# Patient Record
Sex: Female | Born: 1970 | Race: White | Hispanic: No | Smoking: Never smoker
Health system: Southern US, Community
[De-identification: ages and names within clinical notes are randomized; demographics above are authoritative.]

## PROBLEM LIST (undated history)

## (undated) DIAGNOSIS — F419 Anxiety disorder, unspecified: Secondary | ICD-10-CM

## (undated) DIAGNOSIS — F329 Major depressive disorder, single episode, unspecified: Secondary | ICD-10-CM

## (undated) DIAGNOSIS — K219 Gastro-esophageal reflux disease without esophagitis: Secondary | ICD-10-CM

## (undated) DIAGNOSIS — E119 Type 2 diabetes mellitus without complications: Secondary | ICD-10-CM

## (undated) DIAGNOSIS — G629 Polyneuropathy, unspecified: Secondary | ICD-10-CM

## (undated) DIAGNOSIS — R51 Headache: Secondary | ICD-10-CM

## (undated) DIAGNOSIS — F32A Depression, unspecified: Secondary | ICD-10-CM

## (undated) DIAGNOSIS — R519 Headache, unspecified: Secondary | ICD-10-CM

## (undated) HISTORY — DX: Headache, unspecified: R51.9

## (undated) HISTORY — DX: Major depressive disorder, single episode, unspecified: F32.9

## (undated) HISTORY — DX: Type 2 diabetes mellitus without complications: E11.9

## (undated) HISTORY — PX: APPENDECTOMY: SHX54

## (undated) HISTORY — DX: Headache: R51

## (undated) HISTORY — PX: CHOLECYSTECTOMY: SHX55

## (undated) HISTORY — DX: Depression, unspecified: F32.A

## (undated) HISTORY — DX: Anxiety disorder, unspecified: F41.9

## (undated) HISTORY — DX: Polyneuropathy, unspecified: G62.9

## (undated) HISTORY — DX: Gastro-esophageal reflux disease without esophagitis: K21.9

## (undated) HISTORY — PX: TONSILLECTOMY AND ADENOIDECTOMY: SUR1326

---

## 1998-04-26 ENCOUNTER — Encounter: Admission: RE | Admit: 1998-04-26 | Discharge: 1998-07-25 | Payer: Self-pay | Admitting: Gynecology

## 1998-06-25 ENCOUNTER — Other Ambulatory Visit: Admission: RE | Admit: 1998-06-25 | Discharge: 1998-06-25 | Payer: Self-pay | Admitting: Gynecology

## 1998-07-01 ENCOUNTER — Inpatient Hospital Stay (HOSPITAL_COMMUNITY): Admission: AD | Admit: 1998-07-01 | Discharge: 1998-07-01 | Payer: Self-pay | Admitting: Gynecology

## 1998-07-10 ENCOUNTER — Inpatient Hospital Stay (HOSPITAL_COMMUNITY): Admission: AD | Admit: 1998-07-10 | Discharge: 1998-07-12 | Payer: Self-pay | Admitting: Gynecology

## 2016-01-14 DIAGNOSIS — F4001 Agoraphobia with panic disorder: Secondary | ICD-10-CM | POA: Diagnosis not present

## 2016-01-14 DIAGNOSIS — F332 Major depressive disorder, recurrent severe without psychotic features: Secondary | ICD-10-CM | POA: Diagnosis not present

## 2016-01-14 DIAGNOSIS — F411 Generalized anxiety disorder: Secondary | ICD-10-CM | POA: Diagnosis not present

## 2016-05-08 DIAGNOSIS — E1165 Type 2 diabetes mellitus with hyperglycemia: Secondary | ICD-10-CM | POA: Diagnosis not present

## 2016-05-08 DIAGNOSIS — F419 Anxiety disorder, unspecified: Secondary | ICD-10-CM | POA: Diagnosis not present

## 2016-05-08 DIAGNOSIS — R110 Nausea: Secondary | ICD-10-CM | POA: Diagnosis not present

## 2016-05-08 DIAGNOSIS — R05 Cough: Secondary | ICD-10-CM | POA: Diagnosis not present

## 2016-05-08 DIAGNOSIS — Z7984 Long term (current) use of oral hypoglycemic drugs: Secondary | ICD-10-CM | POA: Diagnosis not present

## 2016-05-08 DIAGNOSIS — R51 Headache: Secondary | ICD-10-CM | POA: Diagnosis not present

## 2016-05-08 DIAGNOSIS — Z794 Long term (current) use of insulin: Secondary | ICD-10-CM | POA: Diagnosis not present

## 2016-05-08 DIAGNOSIS — H538 Other visual disturbances: Secondary | ICD-10-CM | POA: Diagnosis not present

## 2016-07-16 DIAGNOSIS — K210 Gastro-esophageal reflux disease with esophagitis: Secondary | ICD-10-CM | POA: Diagnosis not present

## 2016-07-16 DIAGNOSIS — E119 Type 2 diabetes mellitus without complications: Secondary | ICD-10-CM | POA: Diagnosis not present

## 2016-07-16 DIAGNOSIS — F331 Major depressive disorder, recurrent, moderate: Secondary | ICD-10-CM | POA: Diagnosis not present

## 2016-07-16 DIAGNOSIS — Z79899 Other long term (current) drug therapy: Secondary | ICD-10-CM | POA: Diagnosis not present

## 2016-07-16 DIAGNOSIS — E782 Mixed hyperlipidemia: Secondary | ICD-10-CM | POA: Diagnosis not present

## 2016-07-16 DIAGNOSIS — E559 Vitamin D deficiency, unspecified: Secondary | ICD-10-CM | POA: Diagnosis not present

## 2016-08-04 DIAGNOSIS — E782 Mixed hyperlipidemia: Secondary | ICD-10-CM | POA: Diagnosis not present

## 2016-08-04 DIAGNOSIS — E119 Type 2 diabetes mellitus without complications: Secondary | ICD-10-CM | POA: Diagnosis not present

## 2016-08-04 DIAGNOSIS — K219 Gastro-esophageal reflux disease without esophagitis: Secondary | ICD-10-CM | POA: Diagnosis not present

## 2016-08-04 DIAGNOSIS — F329 Major depressive disorder, single episode, unspecified: Secondary | ICD-10-CM | POA: Diagnosis not present

## 2016-10-14 DIAGNOSIS — R358 Other polyuria: Secondary | ICD-10-CM | POA: Diagnosis not present

## 2016-10-14 DIAGNOSIS — K219 Gastro-esophageal reflux disease without esophagitis: Secondary | ICD-10-CM | POA: Diagnosis not present

## 2016-10-14 DIAGNOSIS — E119 Type 2 diabetes mellitus without complications: Secondary | ICD-10-CM | POA: Diagnosis not present

## 2016-10-14 DIAGNOSIS — G4700 Insomnia, unspecified: Secondary | ICD-10-CM | POA: Diagnosis not present

## 2016-10-14 DIAGNOSIS — F411 Generalized anxiety disorder: Secondary | ICD-10-CM | POA: Diagnosis not present

## 2016-12-23 DIAGNOSIS — Z23 Encounter for immunization: Secondary | ICD-10-CM | POA: Diagnosis not present

## 2016-12-23 DIAGNOSIS — F329 Major depressive disorder, single episode, unspecified: Secondary | ICD-10-CM | POA: Diagnosis not present

## 2016-12-23 DIAGNOSIS — K219 Gastro-esophageal reflux disease without esophagitis: Secondary | ICD-10-CM | POA: Diagnosis not present

## 2016-12-23 DIAGNOSIS — F419 Anxiety disorder, unspecified: Secondary | ICD-10-CM | POA: Diagnosis not present

## 2016-12-23 DIAGNOSIS — Z79899 Other long term (current) drug therapy: Secondary | ICD-10-CM | POA: Diagnosis not present

## 2016-12-23 DIAGNOSIS — Z882 Allergy status to sulfonamides status: Secondary | ICD-10-CM | POA: Diagnosis not present

## 2016-12-23 DIAGNOSIS — E1165 Type 2 diabetes mellitus with hyperglycemia: Secondary | ICD-10-CM | POA: Diagnosis not present

## 2016-12-23 DIAGNOSIS — Z794 Long term (current) use of insulin: Secondary | ICD-10-CM | POA: Diagnosis not present

## 2016-12-23 DIAGNOSIS — S61412A Laceration without foreign body of left hand, initial encounter: Secondary | ICD-10-CM | POA: Diagnosis not present

## 2017-02-17 DIAGNOSIS — E119 Type 2 diabetes mellitus without complications: Secondary | ICD-10-CM | POA: Diagnosis not present

## 2017-02-17 DIAGNOSIS — M940 Chondrocostal junction syndrome [Tietze]: Secondary | ICD-10-CM | POA: Diagnosis not present

## 2017-02-17 DIAGNOSIS — Z794 Long term (current) use of insulin: Secondary | ICD-10-CM | POA: Diagnosis not present

## 2017-02-17 DIAGNOSIS — Z9049 Acquired absence of other specified parts of digestive tract: Secondary | ICD-10-CM | POA: Diagnosis not present

## 2017-02-17 DIAGNOSIS — J069 Acute upper respiratory infection, unspecified: Secondary | ICD-10-CM | POA: Diagnosis not present

## 2017-02-17 DIAGNOSIS — M549 Dorsalgia, unspecified: Secondary | ICD-10-CM | POA: Diagnosis not present

## 2017-02-17 DIAGNOSIS — R935 Abnormal findings on diagnostic imaging of other abdominal regions, including retroperitoneum: Secondary | ICD-10-CM | POA: Diagnosis not present

## 2017-04-12 ENCOUNTER — Ambulatory Visit: Payer: Self-pay | Admitting: Family Medicine

## 2017-04-16 ENCOUNTER — Telehealth: Payer: Self-pay | Admitting: *Deleted

## 2017-04-16 NOTE — Telephone Encounter (Signed)
PreVisit Call attempted. Voicemail box not set up.

## 2017-04-19 ENCOUNTER — Encounter: Payer: Self-pay | Admitting: Family Medicine

## 2017-04-19 ENCOUNTER — Ambulatory Visit (INDEPENDENT_AMBULATORY_CARE_PROVIDER_SITE_OTHER): Payer: Medicare Other | Admitting: Family Medicine

## 2017-04-19 VITALS — BP 142/86 | HR 93 | Temp 98.3°F | Ht 64.0 in | Wt 239.8 lb

## 2017-04-19 DIAGNOSIS — E118 Type 2 diabetes mellitus with unspecified complications: Secondary | ICD-10-CM

## 2017-04-19 DIAGNOSIS — R5383 Other fatigue: Secondary | ICD-10-CM

## 2017-04-19 DIAGNOSIS — Z0000 Encounter for general adult medical examination without abnormal findings: Secondary | ICD-10-CM

## 2017-04-19 DIAGNOSIS — Z6841 Body Mass Index (BMI) 40.0 and over, adult: Secondary | ICD-10-CM | POA: Diagnosis not present

## 2017-04-19 DIAGNOSIS — Z794 Long term (current) use of insulin: Secondary | ICD-10-CM | POA: Diagnosis not present

## 2017-04-19 DIAGNOSIS — M25551 Pain in right hip: Secondary | ICD-10-CM | POA: Diagnosis not present

## 2017-04-19 DIAGNOSIS — F5104 Psychophysiologic insomnia: Secondary | ICD-10-CM

## 2017-04-19 DIAGNOSIS — N951 Menopausal and female climacteric states: Secondary | ICD-10-CM

## 2017-04-19 DIAGNOSIS — B353 Tinea pedis: Secondary | ICD-10-CM | POA: Diagnosis not present

## 2017-04-19 DIAGNOSIS — F411 Generalized anxiety disorder: Secondary | ICD-10-CM | POA: Diagnosis not present

## 2017-04-19 DIAGNOSIS — R21 Rash and other nonspecific skin eruption: Secondary | ICD-10-CM | POA: Diagnosis not present

## 2017-04-19 LAB — COMPREHENSIVE METABOLIC PANEL WITH GFR
ALT: 51 U/L — ABNORMAL HIGH (ref 0–35)
AST: 36 U/L (ref 0–37)
Albumin: 4.6 g/dL (ref 3.5–5.2)
Alkaline Phosphatase: 112 U/L (ref 39–117)
BUN: 8 mg/dL (ref 6–23)
CO2: 30 meq/L (ref 19–32)
Calcium: 10 mg/dL (ref 8.4–10.5)
Chloride: 96 meq/L (ref 96–112)
Creatinine, Ser: 0.53 mg/dL (ref 0.40–1.20)
GFR: 131.8 mL/min
Glucose, Bld: 310 mg/dL — ABNORMAL HIGH (ref 70–99)
Potassium: 4.2 meq/L (ref 3.5–5.1)
Sodium: 134 meq/L — ABNORMAL LOW (ref 135–145)
Total Bilirubin: 0.5 mg/dL (ref 0.2–1.2)
Total Protein: 8 g/dL (ref 6.0–8.3)

## 2017-04-19 LAB — POCT GLYCOSYLATED HEMOGLOBIN (HGB A1C): Hemoglobin A1C: 10.7

## 2017-04-19 LAB — MICROALBUMIN / CREATININE URINE RATIO
Creatinine,U: 51.5 mg/dL
MICROALB UR: 1.6 mg/dL (ref 0.0–1.9)
Microalb Creat Ratio: 3.1 mg/g (ref 0.0–30.0)

## 2017-04-19 LAB — LIPID PANEL
CHOLESTEROL: 235 mg/dL — AB (ref 0–200)
HDL: 59.5 mg/dL (ref >39.00)
LDL Cholesterol: 149 mg/dL — ABNORMAL HIGH (ref 0–99)
NonHDL: 175.31
Total CHOL/HDL Ratio: 4
Triglycerides: 131 mg/dL (ref 0.0–149.0)
VLDL: 26.2 mg/dL (ref 0.0–40.0)

## 2017-04-19 LAB — CBC
HEMATOCRIT: 41.3 % (ref 36.0–46.0)
HEMOGLOBIN: 13.5 g/dL (ref 12.0–15.0)
MCHC: 32.6 g/dL (ref 30.0–36.0)
MCV: 85.9 fl (ref 78.0–100.0)
Platelets: 216 10*3/uL (ref 150.0–400.0)
RBC: 4.8 Mil/uL (ref 3.87–5.11)
RDW: 15.3 % (ref 11.5–15.5)
WBC: 8 10*3/uL (ref 4.0–10.5)

## 2017-04-19 LAB — TSH: TSH: 1.17 u[IU]/mL (ref 0.35–4.50)

## 2017-04-19 MED ORDER — GABAPENTIN 300 MG PO CAPS: ORAL_CAPSULE | ORAL | 2 refills | Status: DC

## 2017-04-19 MED ORDER — INSULIN GLARGINE 100 UNITS/ML SOLOSTAR PEN: 20.0000 [IU] | PEN_INJECTOR | Freq: Every day | SUBCUTANEOUS | 11 refills | Status: DC

## 2017-04-19 MED ORDER — CLOTRIMAZOLE 1 % EX CREA: 1.0000 "application " | TOPICAL_CREAM | Freq: Two times a day (BID) | CUTANEOUS | 1 refills | Status: DC

## 2017-04-19 MED ORDER — METFORMIN HCL 1000 MG PO TABS: 1000.0000 mg | ORAL_TABLET | Freq: Two times a day (BID) | ORAL | 0 refills | Status: DC

## 2017-04-19 MED ORDER — PEN NEEDLES 31G X 5 MM MISC: 1.0000 | Freq: Every day | 4 refills | Status: DC

## 2017-04-19 MED ORDER — BLOOD GLUCOSE MONITOR KIT: PACK | 0 refills | Status: DC

## 2017-04-19 NOTE — Patient Instructions (Addendum)
Vitamin E 400 IU Cool rinse at end of shower Please schedule an eye exam- for diabetic eye exam Restart metformin at 1/2 pill twice a day for a couple of days to avoid stomach upset Please call today and [phone] Please call today and schedule an appointment for a therapist- [phone] Schedule follow up/ complete physical in 1 month  Perimenopause Perimenopause is the time when your body begins to move into the menopause (no menstrual period for 12 straight months). It is a natural process. Perimenopause can begin 2-8 years before the menopause and usually lasts for 1 year after the menopause. During this time, your ovaries may or may not produce an egg. The ovaries vary in their production of estrogen and progesterone hormones each month. This can cause irregular menstrual periods, difficulty getting pregnant, vaginal bleeding between periods, and uncomfortable symptoms. What are the causes?  Irregular production of the ovarian hormones, estrogen and progesterone, and not ovulating every month. Other causes include:  Tumor of the pituitary gland in the brain.  Medical disease that affects the ovaries.  Radiation treatment.  Chemotherapy.  Unknown causes.  Heavy smoking and excessive alcohol intake can bring on perimenopause sooner. What are the signs or symptoms?  Hot flashes.  Night sweats.  Irregular menstrual periods.  Decreased sex drive.  Vaginal dryness.  Headaches.  Mood swings.  Depression.  Memory problems.  Irritability.  Tiredness.  Weight gain.  Trouble getting pregnant.  The beginning of losing bone cells (osteoporosis).  The beginning of hardening of the arteries (atherosclerosis). How is this diagnosed? Your health care provider will make a diagnosis by analyzing your age, menstrual history, and symptoms. He or she will do a physical exam and note any changes in your  body, especially your female organs. Female hormone tests may or may not be helpful depending on the amount of female hormones you produce and when you produce them. However, other hormone tests may be helpful to rule out other problems. How is this treated? In some cases, no treatment is needed. The decision on whether treatment is necessary during the perimenopause should be made by you and your health care provider based on how the symptoms are affecting you and your lifestyle. Various treatments are available, such as:  Treating individual symptoms with a specific medicine for that symptom.  Herbal medicines that can help specific symptoms.  Counseling.  Group therapy. Follow these instructions at home:  Keep track of your menstrual periods (when they occur, how heavy they are, how long between periods, and how long they last) as well as your symptoms and when they started.  Only take over-the-counter or prescription medicines as directed by your health care provider.  Sleep and rest.  Exercise.  Eat a diet that contains calcium (good for your bones) and soy (acts like the estrogen hormone).  Do not smoke.  Avoid alcoholic beverages.  Take vitamin supplements as recommended by your health care provider. Taking vitamin E may help in certain cases.  Take calcium and vitamin D supplements to help prevent bone loss.  Group therapy is sometimes helpful.  Acupuncture may help in some cases. Contact a health care provider if:  You have questions about any symptoms you are having.  You need a referral to a specialist (gynecologist, psychiatrist, or psychologist). Get help right away if:  You have vaginal bleeding.  Your period lasts longer than 8 days.  Your periods are recurring sooner than  21 days.  You have bleeding after intercourse.  You have severe depression.  You have pain when you urinate.  You have severe headaches.  You have vision problems. This  information is not intended to replace advice given to you by your health care provider. Make sure you discuss any questions you have with your health care provider. Document Released: 01/21/2005 Document Revised: 05/21/2016 Document Reviewed: 07/13/2013 Elsevier Interactive Patient Education  2017 [employer].
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
## 2017-04-19 NOTE — Progress Notes (Signed)
Pre visit review using our clinic review tool, if applicable. No additional management support is needed unless otherwise documented below in the visit note. 

## 2017-04-20 ENCOUNTER — Encounter: Payer: Self-pay | Admitting: Family Medicine

## 2017-04-21 ENCOUNTER — Other Ambulatory Visit: Payer: Self-pay | Admitting: Family Medicine

## 2017-04-21 ENCOUNTER — Telehealth: Payer: Self-pay | Admitting: Family Medicine

## 2017-04-21 MED ORDER — INSULIN DETEMIR 100 UNIT/ML FLEXPEN: 20.0000 [IU] | PEN_INJECTOR | Freq: Every day | SUBCUTANEOUS | 11 refills | Status: DC

## 2017-04-21 MED ORDER — LISINOPRIL 5 MG PO TABS: 5.0000 mg | ORAL_TABLET | Freq: Every day | ORAL | 3 refills | Status: DC

## 2017-04-21 MED ORDER — ATORVASTATIN CALCIUM 10 MG PO TABS: 10.0000 mg | ORAL_TABLET | Freq: Every day | ORAL | 3 refills | Status: DC

## 2017-04-21 NOTE — Addendum Note (Signed)
Addended by: Olean Ree B on: 04/21/2017 09:25 PM   Modules accepted: Orders

## 2017-04-21 NOTE — Telephone Encounter (Signed)
error 

## 2017-04-21 NOTE — Telephone Encounter (Signed)
Patient called in reference to Insulin Lantus and test strips (as stated below) Please call and advise at [phone]. OK to leave message.

## 2017-04-21 NOTE — Telephone Encounter (Signed)
Johnathan from [facility] called in reference to patient's blood sugar test strips. Wanting to know if we received the fax. Please call and advise [phone]

## 2017-04-28 NOTE — Telephone Encounter (Signed)
Called and spoke with Pharmacy staff requesting that the paper be re-faxed considering that we haven't received it.   Called again today and spoke with Rudie Meyer from [employer]. Which he tranferred me to [employer] and she informed me that she will reach out and try to get this issue addressed today. I informed her that the patient needs to check her blood sugars and this is a concern we have.   Called and spoke with patient informing her that I have reached out to pharmacy again an will contact her as soon as I receive the CMN form. Patient verbalized understanding.

## 2017-04-28 NOTE — Telephone Encounter (Signed)
Received paper work, Completed and faxed back to pharmacy. Called and informed patient, nothing further needed at this time.

## 2017-05-03 ENCOUNTER — Other Ambulatory Visit: Payer: Self-pay | Admitting: Family Medicine

## 2017-05-03 ENCOUNTER — Telehealth: Payer: Self-pay | Admitting: Emergency Medicine

## 2017-05-03 MED ORDER — IBUPROFEN 800 MG PO TABS: 800.0000 mg | ORAL_TABLET | Freq: Two times a day (BID) | ORAL | 0 refills | Status: DC | PRN

## 2017-05-03 NOTE — Telephone Encounter (Signed)
Prescription for ibuprofen sent to patient's pharmacy.

## 2017-05-03 NOTE — Telephone Encounter (Signed)
Pt called stating she has not yet received her test strips. I informed her that I would contact [facility] and check the status. Called [facility] and spoke with KP and she informed me that she has received our fax and is awaiting medicare to approve. She states that it should be sometime today and the patient would received a message on her cell phone.   Patient also complain's of a headache and request ibprofen 800mg  be called in.

## 2017-05-03 NOTE — Progress Notes (Signed)
Patient reports chronic headache and has previously gotten good relief with ibuprofen 800 mg. Will send in prescription. Patient instructed to make appointment if not improved in 3-4 days.

## 2017-05-04 ENCOUNTER — Telehealth: Payer: Self-pay | Admitting: Family Medicine

## 2017-05-04 NOTE — Telephone Encounter (Signed)
[facility] needed [facility] diabetic diagnosis and diabetic type for patient. Please call pharmacy and advise.

## 2017-05-04 NOTE — Telephone Encounter (Signed)
Call[facility] and [facility] and provided her with the diabetic code. Nothing further needed at this time.

## 2017-05-17 ENCOUNTER — Other Ambulatory Visit (HOSPITAL_COMMUNITY)
Admission: RE | Admit: 2017-05-17 | Discharge: 2017-05-17 | Disposition: A | Discharge status: Home / Self Care | Payer: Medicare Other | Source: Ambulatory Visit | Attending: Family Medicine | Admitting: Family Medicine

## 2017-05-17 ENCOUNTER — Encounter: Payer: Self-pay | Admitting: Family Medicine

## 2017-05-17 ENCOUNTER — Ambulatory Visit (INDEPENDENT_AMBULATORY_CARE_PROVIDER_SITE_OTHER): Payer: Medicare Other | Admitting: Family Medicine

## 2017-05-17 VITALS — BP 130/80 | HR 76 | Ht 64.0 in | Wt 235.0 lb

## 2017-05-17 DIAGNOSIS — K219 Gastro-esophageal reflux disease without esophagitis: Secondary | ICD-10-CM | POA: Diagnosis not present

## 2017-05-17 DIAGNOSIS — Z01419 Encounter for gynecological examination (general) (routine) without abnormal findings: Secondary | ICD-10-CM | POA: Diagnosis not present

## 2017-05-17 DIAGNOSIS — E118 Type 2 diabetes mellitus with unspecified complications: Secondary | ICD-10-CM | POA: Diagnosis not present

## 2017-05-17 DIAGNOSIS — Z124 Encounter for screening for malignant neoplasm of cervix: Secondary | ICD-10-CM | POA: Insufficient documentation

## 2017-05-17 DIAGNOSIS — Z794 Long term (current) use of insulin: Secondary | ICD-10-CM

## 2017-05-17 DIAGNOSIS — R112 Nausea with vomiting, unspecified: Secondary | ICD-10-CM | POA: Diagnosis not present

## 2017-05-17 DIAGNOSIS — G44219 Episodic tension-type headache, not intractable: Secondary | ICD-10-CM | POA: Diagnosis not present

## 2017-05-17 DIAGNOSIS — M25551 Pain in right hip: Secondary | ICD-10-CM | POA: Diagnosis not present

## 2017-05-17 DIAGNOSIS — M79671 Pain in right foot: Secondary | ICD-10-CM | POA: Diagnosis not present

## 2017-05-17 DIAGNOSIS — Z0000 Encounter for general adult medical examination without abnormal findings: Secondary | ICD-10-CM | POA: Diagnosis not present

## 2017-05-17 LAB — COMPREHENSIVE METABOLIC PANEL
ALT: 47 U/L — ABNORMAL HIGH (ref 0–35)
AST: 29 U/L (ref 0–37)
Albumin: 4.8 g/dL (ref 3.5–5.2)
Alkaline Phosphatase: 93 U/L (ref 39–117)
BUN: 13 mg/dL (ref 6–23)
CHLORIDE: 94 meq/L — AB (ref 96–112)
CO2: 30 meq/L (ref 19–32)
Calcium: 10.7 mg/dL — ABNORMAL HIGH (ref 8.4–10.5)
Creatinine, Ser: 0.61 mg/dL (ref 0.40–1.20)
GFR: 112.02 mL/min (ref >60.00)
GLUCOSE: 261 mg/dL — AB (ref 70–99)
POTASSIUM: 4.3 meq/L (ref 3.5–5.1)
SODIUM: 134 meq/L — AB (ref 135–145)
Total Bilirubin: 0.5 mg/dL (ref 0.2–1.2)
Total Protein: 8 g/dL (ref 6.0–8.3)

## 2017-05-17 MED ORDER — ONDANSETRON 8 MG PO TBDP: 8.0000 mg | ORAL_TABLET | Freq: Two times a day (BID) | ORAL | 1 refills | Status: DC | PRN

## 2017-05-17 MED ORDER — CYCLOBENZAPRINE HCL 10 MG PO TABS: 5.0000 mg | ORAL_TABLET | Freq: Two times a day (BID) | ORAL | 1 refills | Status: DC | PRN

## 2017-05-17 MED ORDER — GABAPENTIN 300 MG PO CAPS: ORAL_CAPSULE | ORAL | 5 refills | Status: DC

## 2017-05-17 MED ORDER — ESOMEPRAZOLE MAGNESIUM 40 MG PO CPDR: 40.0000 mg | DELAYED_RELEASE_CAPSULE | Freq: Every day | ORAL | 1 refills | Status: DC

## 2017-05-17 NOTE — Patient Instructions (Addendum)
For acid indigestion- try Gavison  Stop your lisinopril and see if symptoms improve  Stop omeprazole and I have sent in Nexiuum  Do gentle neck stretches and take muscle relaxer as prescribed, try heat to area  Follow up in 4-6 weeks, sooner if needed  Check your blood sugar at night before insulin, if over 200, increase your insulin by 2 units to a max of 30 units- you are doing a great job of checking your sugars, bring your log with you at your next visit    Cervical Strain and Sprain Rehab Ask your health care provider which exercises are safe for you. Do exercises exactly as told by your health care provider and adjust them as directed. It is normal to feel mild stretching, pulling, tightness, or discomfort as you do these exercises, but you should stop right away if you feel sudden pain or your pain gets worse.Do not begin these exercises until told by your health care provider. Stretching and range of motion exercises These exercises warm up your muscles and joints and improve the movement and flexibility of your neck. These exercises also help to relieve pain, numbness, and tingling. Exercise A: Cervical side bend   1. Using good posture, sit on a stable chair or stand up. 2. Without moving your shoulders, slowly tilt your left / right ear to your shoulder until you feel a stretch in your neck muscles. You should be looking straight ahead. 3. Hold for __________ seconds. 4. Repeat with the other side of your neck. Repeat __________ times. Complete this exercise __________ times a day. Exercise B: Cervical rotation   1. Using good posture, sit on a stable chair or stand up. 2. Slowly turn your head to the side as if you are looking over your left / right shoulder.  Keep your eyes level with the ground.  Stop when you feel a stretch along the side and the back of your neck. 3. Hold for __________ seconds. 4. Repeat this by turning to your other side. Repeat __________ times.  Complete this exercise __________ times a day. Exercise C: Thoracic extension and pectoral stretch  1. Roll a towel or a small blanket so it is about 4 inches (10 cm) in diameter. 2. Lie down on your back on a firm surface. 3. Put the towel lengthwise, under your spine in the middle of your back. It should not be not under your shoulder blades. The towel should line up with your spine from your middle back to your lower back. 4. Put your hands behind your head and let your elbows fall out to your sides. 5. Hold for __________ seconds. Repeat __________ times. Complete this exercise __________ times a day. Strengthening exercises These exercises build strength and endurance in your neck. Endurance is the ability to use your muscles for a long time, even after your muscles get tired. Exercise D: Upper cervical flexion, isometric  1. Lie on your back with a thin pillow behind your head and a small rolled-up towel under your neck. 2. Gently tuck your chin toward your chest and nod your head down to look toward your feet. Do not lift your head off the pillow. 3. Hold for __________ seconds. 4. Release the tension slowly. Relax your neck muscles completely before you repeat this exercise. Repeat __________ times. Complete this exercise __________ times a day. Exercise E: Cervical extension, isometric   1. Stand about 6 inches (15 cm) away from a wall, with your back facing the  wall. 2. Place a soft object, about 6-8 inches (15-20 cm) in diameter, between the back of your head and the wall. A soft object could be a small pillow, a ball, or a folded towel. 3. Gently tilt your head back and press into the soft object. Keep your jaw and forehead relaxed. 4. Hold for __________ seconds. 5. Release the tension slowly. Relax your neck muscles completely before you repeat this exercise. Repeat __________ times. Complete this exercise __________ times a day. Posture and body mechanics   Body mechanics  refers to the movements and positions of your body while you do your daily activities. Posture is part of body mechanics. Good posture and healthy body mechanics can help to relieve stress in your body's tissues and joints. Good posture means that your spine is in its natural S-curve position (your spine is neutral), your shoulders are pulled back slightly, and your head is not tipped forward. The following are general guidelines for applying improved posture and body mechanics to your everyday activities. Standing   When standing, keep your spine neutral and keep your feet about hip-width apart. Keep a slight bend in your knees. Your ears, shoulders, and hips should line up.  When you do a task in which you stand in one place for a long time, place one foot up on a stable object that is 2-4 inches (5-10 cm) high, such as a footstool. This helps keep your spine neutral. Sitting    When sitting, keep your spine neutral and your keep feet flat on the floor. Use a footrest, if necessary, and keep your thighs parallel to the floor. Avoid rounding your shoulders, and avoid tilting your head forward.  When working at a desk or a computer, keep your desk at a height where your hands are slightly lower than your elbows. Slide your chair under your desk so you are close enough to maintain good posture.  When working at a computer, place your monitor at a height where you are looking straight ahead and you do not have to tilt your head forward or downward to look at the screen. Resting  When lying down and resting, avoid positions that are most painful for you. Try to support your neck in a neutral position. You can use a contour pillow or a small rolled-up towel. Your pillow should support your neck but not push on it. This information is not intended to replace advice given to you by your health care provider. Make sure you discuss any questions you have with your health care provider. Document Released:  12/14/2005 Document Revised: 08/20/2016 Document Reviewed: 11/20/2015 Elsevier Interactive Patient Education  2017 [employer].  Keeping You Healthy  Get These Tests 1. Blood Pressure- Have your blood pressure checked once a year by your health care provider.  Normal blood pressure is 120/80. 2. Weight- Have your body mass index (BMI) calculated to screen for obesity.  BMI is measure of body fat based on height and weight.  You can also calculate your own BMI at https://www.west-esparza.com/. 3. Cholesterol- Have your cholesterol checked every 5 years starting at age 30 then yearly starting at age 81. 4. Chlamydia, HIV, and other sexually transmitted diseases- Get screened every year until age 89, then within three months of each new sexual provider. 5. Pap Test - Every 1-5 years; discuss with your health care provider. 6. Mammogram- Every 1-2 years starting at age 80--50  Take these medicines  Calcium with Vitamin D-Your body needs 1200 mg  of Calcium each day and [phone] IU of Vitamin D daily.  Your body can only absorb 500 mg of Calcium at a time so Calcium must be taken in 2 or 3 divided doses throughout the day.  Multivitamin with folic acid- Once daily if it is possible for you to become pregnant.  Get these Immunizations  Gardasil-Series of three doses; prevents HPV related illness such as genital warts and cervical cancer.  Menactra-Single dose; prevents meningitis.  Tetanus shot- Every 10 years.  Flu shot-Every year.  Take these steps 1. Do not smoke-Your healthcare provider can help you quit.  For tips on how to quit go to www.smokefree.gov or call 1-800 QUITNOW. 2. Be physically active- Exercise 5 days a week for at least 30 minutes.  If you are not already physically active, start slow and gradually work up to 30 minutes of moderate physical activity.  Examples of moderate activity include walking briskly, dancing, swimming, bicycling, etc. 3. Breast Cancer- A self breast  exam every month is important for early detection of breast cancer.  For more information and instruction on self breast exams, ask your healthcare provider or [place].eswww.[place]health.gov/faq/breast-self-exam.cfm. 4. Eat a healthy diet- Eat a variety of healthy foods such as fruits, vegetables, whole grains, low fat milk, low fat cheeses, yogurt, lean meats, poultry and fish, beans, nuts, tofu, etc.  For more information go to www. Thenutritionsource.org 5. Drink alcohol in moderation- Limit alcohol intake to one drink or less per day. Never drink and drive. 6. Depression- Your emotional health is as important as your physical health.  If you're feeling down or losing interest in things you normally enjoy please talk to your healthcare provider about being screened for depression. 7. Dental visit- Brush and floss your teeth twice daily; visit your dentist twice a year. 8. Eye doctor- Get an eye exam at least every 2 years. 9. Helmet use- Always wear a helmet when riding a bicycle, motorcycle, rollerblading or skateboarding. 10. Safe sex- If you may be exposed to sexually transmitted infections, use a condom. 11. Seat belts- Seat belts can save your live; always wear one. 12. Smoke/Carbon Monoxide detectors- These detectors need to be installed on the appropriate level of your home. Replace batteries at least once a year. 13. Skin cancer- When out in the sun please cover up and use sunscreen 15 SPF or higher. 14. Violence- If anyone is threatening or hurting you, please tell your healthcare provider.

## 2017-05-17 NOTE — Progress Notes (Signed)
Subjective:     Patient ID: [name], female   DOB: [birth year], 46 y.o.   MRN: [id number]  HPI This is a 46 yo female who presents today for CPE. Was seen by me to establish care 04/19/2017. At that time, she had been off her medications for several months. Restarted her metformin and Lantus. Started her on gabapentin, atorvastatin, lisinopril; maintained on her omeprazole, clotrimazole. Stopped amitriptyline.  Has resumed lantus and metformin. Blood sugars running 200- 280s. Taking Lantus 20 units at bedtime. Lives with daughter in law who follows vegan diet, so she is mostly eating plant based.   Last CPE- 2014 Mammo- 2014 Pap-2014, has history of abnormal paps with colposcopy Tdap- 11/27/2016 Flu- annual  Eye- requests referral Dental- regular Exercise- plays with grandchild    Review of Systems  Constitutional: Positive for diaphoresis and fatigue.  HENT: Negative.   Eyes: Positive for redness, itching and visual disturbance.  Respiratory: Positive for chest tightness and shortness of breath.   Gastrointestinal: Positive for diarrhea, nausea (started a couple of weeks ago. Thinks related to lisinopril. ) and vomiting (3-4 times in last 3 days).  Endocrine: Negative.   Genitourinary: Negative.   Musculoskeletal: Positive for back pain, myalgias and neck pain.       Hip pain improved, thinks gabapentin is helping.   Skin: Positive for rash (on back, improved with clotrimazole).  Allergic/Immunologic: Positive for environmental allergies.  Neurological: Positive for headaches (daily, pain in neck, tight. Was taking ibuprofen, tried acetaminophen, Excedrin Migrain. ).  Psychiatric/Behavioral: Positive for agitation and sleep disturbance (improved on gabapentin, headache and neck pain keeping her awake.). The patient is nervous/anxious.        She has an appointment with psychiatry in the next 2 weeks and will start therapy after initial evaluation.        Objective:   Physical  Exam  Constitutional: She is oriented to person, place, and time. She appears well-developed and well-nourished. No distress.  HENT:  Head: Normocephalic and atraumatic.  Right Ear: External ear normal.  Left Ear: External ear normal.  Mouth/Throat: Oropharynx is clear and moist.  Eyes: Conjunctivae are normal.  Neck: Normal range of motion. Neck supple. Muscular tenderness (pain with movement) present. No spinous process tenderness present.  Cardiovascular: Normal rate, regular rhythm and normal heart sounds.   Pulmonary/Chest: Effort normal and breath sounds normal. Right breast exhibits no inverted nipple, no mass, no nipple discharge, no skin change and no tenderness. Left breast exhibits no inverted nipple, no mass, no nipple discharge, no skin change and no tenderness. Breasts are symmetrical.  Abdominal: Soft. Bowel sounds are normal.  Genitourinary: Vagina normal. No breast swelling, tenderness, discharge or bleeding. Pelvic exam was performed with patient supine. No labial fusion. There is no rash, tenderness, lesion or injury on the right labia. There is no rash, tenderness, lesion or injury on the left labia. Cervix exhibits no motion tenderness, no discharge and no friability.  Musculoskeletal: She exhibits no edema.  Lymphadenopathy:    She has no cervical adenopathy.  Neurological: She is alert and oriented to person, place, and time.  Skin: Skin is warm and dry. Rash (on back, improved from last visit.) noted. She is not diaphoretic.  Psychiatric: She has a normal mood and affect. Her behavior is normal. Judgment and thought content normal.  Vitals reviewed.     BP 130/80 (BP Location: Right Arm, Patient Position: Sitting, Cuff Size: Large)   Pulse 76   Ht 5\' 4"  (1.626  m)   Wt 235 lb (106.6 kg)   LMP 05/03/2017   SpO2 97%   BMI 40.34 kg/m  Wt Readings from Last 3 Encounters:  05/17/17 235 lb (106.6 kg)  04/19/17 239 lb 12.8 oz (108.8 kg)   Diabetic foot exam: Normal  color Mild excoriation on right ball of foot under great toe, white patchy area between 4th-5th toe No calluses  Normal DP/PT pulses Normal sensation to light touch Nails normal  Assessment:     1. Type 2 diabetes mellitus with complication, with long-term current use of insulin (HCC) - provided written and verbal instructions to increase lantus by 2 units if blood glucose greater than 200 to maximum 30 units - gabapentin (NEURONTIN) 300 MG capsule; Take one tablet in the morning and two tablets at bedtime.  Dispense: 90 capsule; Refill: 5 - Comprehensive metabolic panel - Ambulatory referral to Ophthalmology - Ambulatory referral to Podiatry  2. Gastroesophageal reflux disease, esophagitis presence not specified - has done better on esomeprazole in the past, will stop omeprazole go back on esomeprazole - esomeprazole (NEXIUM) 40 MG capsule; Take 1 capsule (40 mg total) by mouth daily.  Dispense: 90 capsule; Refill: 1  3. Right hip pain - some improvement with gabapentin, will increase dose from one twice a day to one in am, two at bedtime - gabapentin (NEURONTIN) 300 MG capsule; Take one tablet in the morning and two tablets at bedtime.  Dispense: 90 capsule; Refill: 5  4. Non-intractable vomiting with nausea, unspecified vomiting type - ondansetron (ZOFRAN-ODT) 8 MG disintegrating tablet; Take 1 tablet (8 mg total) by mouth every 12 (twelve) hours as needed for nausea or vomiting.  Dispense: 30 tablet; Refill: 1 - Comprehensive metabolic panel - she was instructed to let me know if not better in 2 weeks  5. Episodic tension-type headache, not intractable - she thinks these are related to lisinopril- will stop for 2 weeks to see if symptoms improved, consider adding ARB instead for kidney protective benefit - encouraged gentle ROM, heat - cyclobenzaprine (FLEXERIL) 10 MG tablet; Take 0.5-1 tablets (5-10 mg total) by mouth 2 (two) times daily as needed for muscle spasms.  Dispense: 30  tablet; Refill: 1  6. Screening for cervical cancer - Cytology - PAP  7. Right foot pain - Ambulatory referral to Podiatry  8. Annual physical exam - Discussed and encouraged healthy lifestyle choices- adequate sleep, regular exercise, stress management and healthy food choices.   - follow up in 4-6 weeks, sooner if any worsening symptoms  Olean Reeeborah Gessner, FNP-BC  [facility] [place], [facility]  05/17/2017 11:16 AM     Plan:     See above

## 2017-05-18 ENCOUNTER — Other Ambulatory Visit: Payer: Self-pay | Admitting: *Deleted

## 2017-05-18 LAB — CYTOLOGY - PAP
Diagnosis: NEGATIVE
HPV (WINDOPATH): NOT DETECTED

## 2017-05-18 MED ORDER — MICROLET LANCETS MISC: 12 refills | Status: DC

## 2017-05-27 ENCOUNTER — Encounter (HOSPITAL_COMMUNITY): Payer: Self-pay | Admitting: Psychiatry

## 2017-05-27 ENCOUNTER — Ambulatory Visit (INDEPENDENT_AMBULATORY_CARE_PROVIDER_SITE_OTHER): Payer: Medicare Other | Admitting: Psychiatry

## 2017-05-27 VITALS — BP 130/82 | HR 88 | Ht 64.0 in | Wt 237.6 lb

## 2017-05-27 DIAGNOSIS — F3131 Bipolar disorder, current episode depressed, mild: Secondary | ICD-10-CM

## 2017-05-27 DIAGNOSIS — Z818 Family history of other mental and behavioral disorders: Secondary | ICD-10-CM

## 2017-05-27 DIAGNOSIS — F4310 Post-traumatic stress disorder, unspecified: Secondary | ICD-10-CM | POA: Diagnosis not present

## 2017-05-27 DIAGNOSIS — F419 Anxiety disorder, unspecified: Secondary | ICD-10-CM | POA: Diagnosis not present

## 2017-05-27 MED ORDER — LAMOTRIGINE 25 MG PO TABS: ORAL_TABLET | ORAL | 0 refills | Status: DC

## 2017-05-27 MED ORDER — CHLORPROMAZINE HCL 25 MG PO TABS: 25.0000 mg | ORAL_TABLET | Freq: Every day | ORAL | 0 refills | Status: DC
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
## 2017-05-31 ENCOUNTER — Other Ambulatory Visit: Payer: Self-pay

## 2017-05-31 MED ORDER — CONTOUR NEXT TEST VI STRP: ORAL_STRIP | 5 refills | Status: DC

## 2017-06-09 ENCOUNTER — Encounter: Payer: Self-pay | Admitting: Family Medicine

## 2017-06-10 ENCOUNTER — Encounter: Payer: Self-pay | Admitting: Family Medicine

## 2017-06-14 ENCOUNTER — Ambulatory Visit: Payer: Medicare Other | Admitting: Family Medicine

## 2017-06-14 ENCOUNTER — Other Ambulatory Visit: Payer: Self-pay | Admitting: *Deleted

## 2017-06-14 NOTE — Telephone Encounter (Signed)
Received refill request from [facility] ([address] [place] [address] Tel #[phone]) for the below medications. It appears both medications were prescribed by Debbie on 05/17/17. Please advise on approval for refills:  Cyclobenzaprine 10 mg tablets  Qty: 30  Ondansetron ODT 8 mg tablets Qty: 30   Notes from appointment on 05/17/17:  4. Non-intractable vomiting with nausea, unspecified vomiting type - ondansetron (ZOFRAN-ODT) 8 MG disintegrating tablet; Take 1 tablet (8 mg total) by mouth every 12 (twelve) hours as needed for nausea or vomiting.  Dispense: 30 tablet; Refill: 1 - Comprehensive metabolic panel - she was instructed to let me know if not better in 2 weeks  5. Episodic tension-type headache, not intractable - she thinks these are related to lisinopril- will stop for 2 weeks to see if symptoms improved, consider adding ARB instead for kidney protective benefit - encouraged gentle ROM, heat - cyclobenzaprine (FLEXERIL) 10 MG tablet; Take 0.5-1 tablets (5-10 mg total) by mouth 2 (two) times daily as needed for muscle spasms.  Dispense: 30 tablet; Refill: 1

## 2017-06-14 NOTE — Telephone Encounter (Signed)
Those both have refills, please call the pharmacy and see if they were refilled.

## 2017-06-16 NOTE — Telephone Encounter (Signed)
[facility] [place], she said both Rx's were refill on 6/2 and they are both only 15 day supply so she would be out. There are no more refills available. Please advise if you want Zofran and Flexeril refilled?

## 2017-06-18 ENCOUNTER — Encounter (HOSPITAL_COMMUNITY): Payer: Self-pay | Admitting: Psychiatry

## 2017-06-18 ENCOUNTER — Other Ambulatory Visit: Payer: Self-pay | Admitting: Family Medicine

## 2017-06-18 ENCOUNTER — Ambulatory Visit (INDEPENDENT_AMBULATORY_CARE_PROVIDER_SITE_OTHER): Payer: Medicare Other | Admitting: Psychiatry

## 2017-06-18 VITALS — BP 128/76 | HR 90 | Ht 64.0 in | Wt 236.4 lb

## 2017-06-18 DIAGNOSIS — R112 Nausea with vomiting, unspecified: Secondary | ICD-10-CM

## 2017-06-18 DIAGNOSIS — Z818 Family history of other mental and behavioral disorders: Secondary | ICD-10-CM | POA: Diagnosis not present

## 2017-06-18 DIAGNOSIS — F3131 Bipolar disorder, current episode depressed, mild: Secondary | ICD-10-CM

## 2017-06-18 DIAGNOSIS — F410 Panic disorder [episodic paroxysmal anxiety] without agoraphobia: Secondary | ICD-10-CM

## 2017-06-18 DIAGNOSIS — F4310 Post-traumatic stress disorder, unspecified: Secondary | ICD-10-CM | POA: Diagnosis not present

## 2017-06-18 DIAGNOSIS — F419 Anxiety disorder, unspecified: Secondary | ICD-10-CM

## 2017-06-18 DIAGNOSIS — G44219 Episodic tension-type headache, not intractable: Secondary | ICD-10-CM

## 2017-06-18 MED ORDER — LAMOTRIGINE 100 MG PO TABS: 100.0000 mg | ORAL_TABLET | Freq: Every day | ORAL | 1 refills | Status: DC

## 2017-06-18 MED ORDER — CHLORPROMAZINE HCL 50 MG PO TABS: 50.0000 mg | ORAL_TABLET | Freq: Every day | ORAL | 1 refills | Status: DC

## 2017-06-18 MED ORDER — CYCLOBENZAPRINE HCL 10 MG PO TABS: 5.0000 mg | ORAL_TABLET | Freq: Two times a day (BID) | ORAL | 1 refills | Status: DC | PRN

## 2017-06-18 MED ORDER — CLONAZEPAM 0.5 MG PO TABS: 0.5000 mg | ORAL_TABLET | Freq: Every day | ORAL | 0 refills | Status: DC | PRN

## 2017-06-18 MED ORDER — ONDANSETRON 8 MG PO TBDP: 8.0000 mg | ORAL_TABLET | Freq: Two times a day (BID) | ORAL | 1 refills | Status: DC | PRN
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
## 2017-06-18 NOTE — Telephone Encounter (Signed)
Sent refills. Please let patient know.

## 2017-06-18 NOTE — Telephone Encounter (Signed)
Pt is aware via mychart that medications have been sent into the pharmacy.

## 2017-06-23 ENCOUNTER — Other Ambulatory Visit: Payer: Self-pay

## 2017-06-23 DIAGNOSIS — B353 Tinea pedis: Secondary | ICD-10-CM

## 2017-06-23 MED ORDER — CLOTRIMAZOLE 1 % EX CREA: 1.0000 "application " | TOPICAL_CREAM | Freq: Two times a day (BID) | CUTANEOUS | 1 refills | Status: DC

## 2017-06-24 ENCOUNTER — Ambulatory Visit (HOSPITAL_COMMUNITY): Payer: Medicare Other | Admitting: Psychology

## 2017-06-25 ENCOUNTER — Ambulatory Visit: Payer: Medicare Other | Admitting: Family Medicine

## 2017-07-02 ENCOUNTER — Encounter: Payer: Self-pay | Admitting: Family Medicine

## 2017-07-02 ENCOUNTER — Ambulatory Visit (INDEPENDENT_AMBULATORY_CARE_PROVIDER_SITE_OTHER): Payer: Medicare Other | Admitting: Family Medicine

## 2017-07-02 VITALS — BP 120/80 | HR 102 | Temp 98.4°F | Wt 239.4 lb

## 2017-07-02 DIAGNOSIS — M25551 Pain in right hip: Secondary | ICD-10-CM | POA: Diagnosis not present

## 2017-07-02 DIAGNOSIS — E118 Type 2 diabetes mellitus with unspecified complications: Secondary | ICD-10-CM | POA: Diagnosis not present

## 2017-07-02 DIAGNOSIS — B373 Candidiasis of vulva and vagina: Secondary | ICD-10-CM

## 2017-07-02 DIAGNOSIS — Z794 Long term (current) use of insulin: Secondary | ICD-10-CM | POA: Diagnosis not present

## 2017-07-02 DIAGNOSIS — M549 Dorsalgia, unspecified: Secondary | ICD-10-CM

## 2017-07-02 DIAGNOSIS — B3731 Acute candidiasis of vulva and vagina: Secondary | ICD-10-CM

## 2017-07-02 DIAGNOSIS — G8929 Other chronic pain: Secondary | ICD-10-CM

## 2017-07-02 MED ORDER — GABAPENTIN 300 MG PO CAPS: 600.0000 mg | ORAL_CAPSULE | Freq: Two times a day (BID) | ORAL | 2 refills | Status: DC

## 2017-07-02 MED ORDER — MICONAZOLE NITRATE 2 % EX CREA: 1.0000 "application " | TOPICAL_CREAM | Freq: Two times a day (BID) | CUTANEOUS | 0 refills | Status: DC

## 2017-07-02 MED ORDER — FLUCONAZOLE 150 MG PO TABS: 150.0000 mg | ORAL_TABLET | Freq: Once | ORAL | 0 refills | Status: AC

## 2017-07-02 NOTE — Progress Notes (Signed)
Subjective:    Patient ID: Jodi Wolfe, female    DOB: 1971/11/27, 46 y.o.   MRN: [id number]  HPI This is a 46 yo female who presents today with vaginal itching x 1 week. No discharge. No dysuria. Pain with wiping.  Blood sugar running 400s over last several days, was 160-180s. . Taking Levamir 22 mg q hs.  Has been seeing Dr. Lolly MustacheArfeen and is currently on Lamictal, thorazine, clonazepam. Has an appointment to start counseling. Working on anxiety and depression. Continues to have stress with recent move and living with son and his family and taking care of her granddaughter.   Past Medical History:  Diagnosis Date  . Anxiety   . Depression   . Diabetes mellitus without complication (HCC)   . GERD (gastroesophageal reflux disease)    Past Surgical History:  Procedure Laterality Date  . APPENDECTOMY    . CHOLECYSTECTOMY    . TONSILLECTOMY AND ADENOIDECTOMY     Family History  Problem Relation Age of Onset  . Hyperlipidemia Mother   . Hypertension Mother   . Stroke Mother   . Hyperlipidemia Father   . Hypertension Father   . Stroke Father   . Bipolar disorder Father   . Hyperlipidemia Brother   . Hypertension Brother    Social History  Substance Use Topics  . Smoking status: Never Smoker  . Smokeless tobacco: Never Used  . Alcohol use No      Review of Systems Per HPI    Objective:   Physical Exam  Constitutional: She is oriented to person, place, and time. She appears well-developed and well-nourished. No distress.  Obese.   HENT:  Head: Normocephalic and atraumatic.  Eyes: Conjunctivae are normal.  Cardiovascular: Normal rate.   Pulmonary/Chest: Effort normal.  Genitourinary: Vaginal discharge (thick white, erythematous labia. ) found.  Neurological: She is alert and oriented to person, place, and time.  Skin: Skin is warm and dry. She is not diaphoretic.  Psychiatric: She has a normal mood and affect. Her behavior is normal. Judgment and thought content  normal.  Vitals reviewed.     BP 120/80 (BP Location: Right Arm, Patient Position: Sitting, Cuff Size: Normal)   Pulse (!) 102   Temp 98.4 F (36.9 C) (Oral)   Wt 239 lb 6.4 oz (108.6 kg)   SpO2 96%   BMI 41.09 kg/m  Wt Readings from Last 3 Encounters:  07/02/17 239 lb 6.4 oz (108.6 kg)  05/17/17 235 lb (106.6 kg)  04/19/17 239 lb 12.8 oz (108.8 kg)       Assessment & Plan:  1. Vaginal candidiasis - will treat with oral and give her some cream to apply on labia for irritation - fluconazole (DIFLUCAN) 150 MG tablet; Take 1 tablet (150 mg total) by mouth once. Repeat if needed  Dispense: 2 tablet; Refill: 0 - miconazole (MICOTIN) 2 % cream; Apply 1 application topically 2 (two) times daily.  Dispense: 28.35 g; Refill: 0  2. Type 2 diabetes mellitus with complication, with long-term current use of insulin (HCC) - given instructions for increasing Levamir, metformin - gabapentin (NEURONTIN) 300 MG capsule; Take 2 capsules (600 mg total) by mouth 2 (two) times daily.  Dispense: 120 capsule; Refill: 2 - follow up in 1 month, she will be due for HgbA1C. Elevated blood sugars could be related to discomfort with #1, increased stress.   3. Other chronic back pain - gabapentin (NEURONTIN) 300 MG capsule; Take 2 capsules (600 mg total) by mouth 2 (  two) times daily.  Dispense: 120 capsule; Refill: 2  4. Right hip pain - gabapentin (NEURONTIN) 300 MG capsule; Take 2 capsules (600 mg total) by mouth 2 (two) times daily.  Dispense: 120 capsule; Refill: 2   Olean Ree, FNP-BC  [facility] [place], [facility]  07/05/2017 9:00 AM

## 2017-07-02 NOTE — Patient Instructions (Addendum)
Check your blood sugar before Levamir- if over 200, increase by 2 units- if you get to 30 units, let me know  Increase your gabapentin to two tablets in the morning and two tablets at bedtime  I have sent in two prescriptions for yeast infection- one a tablet and one a cream  If not better by Monday, please let me know  Please follow up in 1 month to have labs checked

## 2017-07-04 ENCOUNTER — Encounter: Payer: Self-pay | Admitting: Family Medicine

## 2017-07-05 ENCOUNTER — Telehealth: Payer: Self-pay | Admitting: Family Medicine

## 2017-07-05 DIAGNOSIS — E119 Type 2 diabetes mellitus without complications: Secondary | ICD-10-CM | POA: Diagnosis not present

## 2017-07-05 DIAGNOSIS — H5213 Myopia, bilateral: Secondary | ICD-10-CM | POA: Diagnosis not present

## 2017-07-05 DIAGNOSIS — Z794 Long term (current) use of insulin: Secondary | ICD-10-CM

## 2017-07-05 DIAGNOSIS — E118 Type 2 diabetes mellitus with unspecified complications: Secondary | ICD-10-CM

## 2017-07-05 LAB — HM DIABETES EYE EXAM

## 2017-07-05 MED ORDER — METFORMIN HCL 500 MG PO TABS: ORAL_TABLET | ORAL | 1 refills | Status: DC

## 2017-07-05 NOTE — Telephone Encounter (Signed)
Please ask her if her vaginal symptoms have improved with medicine. Please send in metformin 500 mg- instructions- 3 with breakfast, 2 with dinner. 90 day supply. Let us know if blood sugars not coming down over next several days. Tell her to watch her diet carefully- no regular soda/juice/sweet tea. Eat non starchy vegetables, lean proteins (chicken, meat, eggs, fish).

## 2017-07-05 NOTE — Telephone Encounter (Signed)
Blood sugar this morning was 325. Last PM injected 24 units at 10 pm. She is taking her metformin twice per day as well. I am sending in a refill for her on metformin. Per your note you wanted her to titrate up to 30 then call if BS are still uncontrolled. Please advise if you want to make any changes now.

## 2017-07-05 NOTE — Telephone Encounter (Signed)
Medication sent into pharmacy. Annotations below sent to mychart.

## 2017-07-05 NOTE — Telephone Encounter (Signed)
Patient called to advise that she was told to call us on Friday if her blood sugars remained uncontrolled. Her readings over the weekend sustained 350-400 level.   Eunice BlaseDebbie is currently out of office. Please advise patient.

## 2017-07-19 ENCOUNTER — Ambulatory Visit (INDEPENDENT_AMBULATORY_CARE_PROVIDER_SITE_OTHER): Payer: Medicare Other | Admitting: Podiatry

## 2017-07-19 ENCOUNTER — Other Ambulatory Visit (HOSPITAL_COMMUNITY): Payer: Self-pay

## 2017-07-19 ENCOUNTER — Encounter: Payer: Self-pay | Admitting: Podiatry

## 2017-07-19 DIAGNOSIS — F410 Panic disorder [episodic paroxysmal anxiety] without agoraphobia: Secondary | ICD-10-CM

## 2017-07-19 DIAGNOSIS — B353 Tinea pedis: Secondary | ICD-10-CM

## 2017-07-19 DIAGNOSIS — L600 Ingrowing nail: Secondary | ICD-10-CM

## 2017-07-19 DIAGNOSIS — R234 Changes in skin texture: Secondary | ICD-10-CM

## 2017-07-19 DIAGNOSIS — B351 Tinea unguium: Secondary | ICD-10-CM | POA: Diagnosis not present

## 2017-07-19 DIAGNOSIS — E1149 Type 2 diabetes mellitus with other diabetic neurological complication: Secondary | ICD-10-CM

## 2017-07-19 MED ORDER — CLONAZEPAM 0.5 MG PO TABS: 0.5000 mg | ORAL_TABLET | Freq: Every day | ORAL | 0 refills | Status: DC | PRN

## 2017-07-19 NOTE — Patient Instructions (Signed)

## 2017-07-19 NOTE — Progress Notes (Signed)
   Subjective:    Patient ID: Glade Nurseammy Sonn, female    DOB: 06/17/1971, 46 y.o.   MRN: [id number]  HPI 46 year old female presents the office today for concerns of cracking scanned the bottom of her right big toe. She states that she previously tried over-the-counter moisturizers she is also been doing antifungal cream which is not helping. She denies any recent injury or trauma. She states that she's been diagnosed neuropathy she is on gabapentin which is been helping. Her last A1c was 10.7. She has no other concerns today.   Review of Systems  All other systems reviewed and are negative.      Objective:   Physical Exam General: AAO x3, NAD  Dermatological: The right hallux toenail. 3 dystrophic, discolored yellow-brown discoloration of the nail mildly hypertrophic. There is incurvation of both the medial and lateral nail borders. There is tenderness the medial distal nail border. After debridement there was resolution of pain. No drainage or pus in there is no signs of infection. On the plantar aspect of the right hallux is dry skin with fissuring present. There is no open sores identified. Small amount of tinea pedis present the right first interspace. No other open lesions or pre-ulcer lesions identified today.  Vascular: DP/PT pulses 2/4, CRT less than 3 seconds.  There is no pain with calf compression, swelling, warmth, erythema.   Neruologic: Sensation decreased with Dorann OuSimms Weinstein monofilament.   Musculoskeletal: HAV present L >> R. No pain, crepitus, or limitation noted with foot and ankle range of motion bilateral. Muscular strength 5/5 in all groups tested bilateral.  Gait: Unassisted, Nonantalgic.     Assessment & Plan:  46 year old female with dry skin, skin fissuring plantar hallux, mild tinea pedis right first interspace, onychomycosis with ingrown toenail  -Treatment options discussed including all alternatives, risks, and complications -Etiology of symptoms were  discussed -Continue antifungal cream for the first interspace.  -I gave her Reviderm cream for the skin fissuring -Debrided the symptomatic portion ingrown toenail. Monitor for signs or symptoms of infection. She was concerned of the nail fungus. I ordered a topical antifungal ointment through [facility].  -Daily foot inspection -RTC 3 months or sooner if needed.  Ovid CurdMatthew Wagoner, DPM

## 2017-07-26 ENCOUNTER — Telehealth: Payer: Self-pay | Admitting: *Deleted

## 2017-07-26 DIAGNOSIS — G44219 Episodic tension-type headache, not intractable: Secondary | ICD-10-CM

## 2017-07-26 MED ORDER — CYCLOBENZAPRINE HCL 10 MG PO TABS: 5.0000 mg | ORAL_TABLET | Freq: Two times a day (BID) | ORAL | 1 refills | Status: DC | PRN

## 2017-07-26 NOTE — Telephone Encounter (Signed)
Rx sent to pharmacy   

## 2017-07-26 NOTE — Telephone Encounter (Signed)
Ok to refill 

## 2017-07-26 NOTE — Telephone Encounter (Signed)
Jodi Wolfe, received fax request for refill on Cyclobenzaprine 10 mg. Please advise if okay to refill?

## 2017-07-29 ENCOUNTER — Ambulatory Visit (HOSPITAL_COMMUNITY): Payer: Self-pay | Admitting: Psychology

## 2017-08-02 ENCOUNTER — Ambulatory Visit (INDEPENDENT_AMBULATORY_CARE_PROVIDER_SITE_OTHER): Payer: Medicare Other | Admitting: Psychology

## 2017-08-02 ENCOUNTER — Encounter (HOSPITAL_COMMUNITY): Payer: Self-pay | Admitting: Psychology

## 2017-08-02 DIAGNOSIS — F4310 Post-traumatic stress disorder, unspecified: Secondary | ICD-10-CM | POA: Diagnosis not present

## 2017-08-02 DIAGNOSIS — F3131 Bipolar disorder, current episode depressed, mild: Secondary | ICD-10-CM
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
## 2017-08-03 ENCOUNTER — Encounter (HOSPITAL_COMMUNITY): Payer: Self-pay | Admitting: Psychiatry

## 2017-08-03 ENCOUNTER — Ambulatory Visit (INDEPENDENT_AMBULATORY_CARE_PROVIDER_SITE_OTHER): Payer: Medicare Other | Admitting: Psychiatry

## 2017-08-03 DIAGNOSIS — F410 Panic disorder [episodic paroxysmal anxiety] without agoraphobia: Secondary | ICD-10-CM

## 2017-08-03 DIAGNOSIS — Z818 Family history of other mental and behavioral disorders: Secondary | ICD-10-CM | POA: Diagnosis not present

## 2017-08-03 DIAGNOSIS — Z813 Family history of other psychoactive substance abuse and dependence: Secondary | ICD-10-CM | POA: Diagnosis not present

## 2017-08-03 DIAGNOSIS — F3131 Bipolar disorder, current episode depressed, mild: Secondary | ICD-10-CM

## 2017-08-03 MED ORDER — CHLORPROMAZINE HCL 50 MG PO TABS: 50.0000 mg | ORAL_TABLET | Freq: Every day | ORAL | 1 refills | Status: DC

## 2017-08-03 MED ORDER — LAMOTRIGINE 150 MG PO TABS: 150.0000 mg | ORAL_TABLET | Freq: Every day | ORAL | 1 refills | Status: DC

## 2017-08-03 MED ORDER — CLONAZEPAM 0.5 MG PO TABS: 0.5000 mg | ORAL_TABLET | Freq: Two times a day (BID) | ORAL | 1 refills | Status: DC | PRN
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
## 2017-08-09 ENCOUNTER — Ambulatory Visit: Payer: Self-pay | Admitting: Family Medicine

## 2017-08-09 DIAGNOSIS — Z0289 Encounter for other administrative examinations: Secondary | ICD-10-CM

## 2017-08-12 ENCOUNTER — Encounter: Payer: Self-pay | Admitting: Family Medicine

## 2017-08-16 ENCOUNTER — Ambulatory Visit (INDEPENDENT_AMBULATORY_CARE_PROVIDER_SITE_OTHER): Payer: Medicare Other | Admitting: Family Medicine

## 2017-08-16 ENCOUNTER — Encounter: Payer: Self-pay | Admitting: Family Medicine

## 2017-08-16 ENCOUNTER — Ambulatory Visit (INDEPENDENT_AMBULATORY_CARE_PROVIDER_SITE_OTHER): Payer: Medicare Other

## 2017-08-16 VITALS — BP 112/80 | HR 95 | Temp 98.5°F | Wt 242.8 lb

## 2017-08-16 DIAGNOSIS — Z794 Long term (current) use of insulin: Secondary | ICD-10-CM | POA: Diagnosis not present

## 2017-08-16 DIAGNOSIS — G44219 Episodic tension-type headache, not intractable: Secondary | ICD-10-CM

## 2017-08-16 DIAGNOSIS — E118 Type 2 diabetes mellitus with unspecified complications: Secondary | ICD-10-CM

## 2017-08-16 DIAGNOSIS — M542 Cervicalgia: Secondary | ICD-10-CM

## 2017-08-16 LAB — POCT GLYCOSYLATED HEMOGLOBIN (HGB A1C): HEMOGLOBIN A1C: 8.7

## 2017-08-16 LAB — GLUCOSE, POCT (MANUAL RESULT ENTRY): POC Glucose: 316 mg/dl — AB (ref 70–99)

## 2017-08-16 MED ORDER — NAPROXEN 250 MG PO TABS: 250.0000 mg | ORAL_TABLET | Freq: Two times a day (BID) | ORAL | 0 refills | Status: DC | PRN
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
## 2017-08-16 NOTE — Patient Instructions (Addendum)
Please increase your Lantus by 2 units if your blood sugar is over 200 up to 30 units.   Follow up in 4-6 weeks

## 2017-08-19 ENCOUNTER — Other Ambulatory Visit: Payer: Self-pay | Admitting: Family Medicine

## 2017-08-19 DIAGNOSIS — G44219 Episodic tension-type headache, not intractable: Secondary | ICD-10-CM

## 2017-08-20 ENCOUNTER — Encounter: Payer: Self-pay | Admitting: Family Medicine

## 2017-08-23 ENCOUNTER — Other Ambulatory Visit: Payer: Self-pay | Admitting: Emergency Medicine

## 2017-08-23 DIAGNOSIS — G44219 Episodic tension-type headache, not intractable: Secondary | ICD-10-CM

## 2017-08-23 MED ORDER — CYCLOBENZAPRINE HCL 10 MG PO TABS: 5.0000 mg | ORAL_TABLET | Freq: Two times a day (BID) | ORAL | 1 refills | Status: DC | PRN

## 2017-08-23 NOTE — Telephone Encounter (Signed)
Patient wanted to speak to nurse about her cyclobenzaprine (FLEXERIL) 10 MG tablet. The patient would not tell me specifics however, it may be about the refill request. Call to advise.

## 2017-08-23 NOTE — Telephone Encounter (Signed)
Patient called the pharmacy and they are still advising her that they do not have the rx. Patient is requesting another refill be sent in. Please advise.

## 2017-08-24 ENCOUNTER — Encounter: Payer: Self-pay | Admitting: Family Medicine

## 2017-09-09 ENCOUNTER — Other Ambulatory Visit: Payer: Self-pay | Admitting: Family Medicine

## 2017-09-09 ENCOUNTER — Encounter: Payer: Self-pay | Admitting: Family Medicine

## 2017-09-09 ENCOUNTER — Other Ambulatory Visit (HOSPITAL_COMMUNITY): Payer: Self-pay | Admitting: Psychiatry

## 2017-09-09 ENCOUNTER — Telehealth (HOSPITAL_COMMUNITY): Payer: Self-pay

## 2017-09-09 DIAGNOSIS — F410 Panic disorder [episodic paroxysmal anxiety] without agoraphobia: Secondary | ICD-10-CM

## 2017-09-09 MED ORDER — FLUCONAZOLE 150 MG PO TABS: 150.0000 mg | ORAL_TABLET | Freq: Once | ORAL | 0 refills | Status: AC

## 2017-09-09 NOTE — Telephone Encounter (Signed)
Patient is calling for a refill on her Klonopin, she is taking 2 a day and the prescription is written for 45 tablets so she has run out. Please review and advise, thank you

## 2017-09-09 NOTE — Telephone Encounter (Signed)
Okay to give extra 15 tablets

## 2017-09-10 MED ORDER — CLONAZEPAM 0.5 MG PO TABS: 0.5000 mg | ORAL_TABLET | Freq: Two times a day (BID) | ORAL | 0 refills | Status: DC | PRN

## 2017-09-13 ENCOUNTER — Ambulatory Visit: Payer: Medicare Other | Admitting: Family Medicine

## 2017-09-14 ENCOUNTER — Telehealth (HOSPITAL_COMMUNITY): Payer: Self-pay | Admitting: Psychology

## 2017-09-26 ENCOUNTER — Encounter: Payer: Self-pay | Admitting: Family Medicine

## 2017-09-27 ENCOUNTER — Encounter: Payer: Self-pay | Admitting: Family Medicine

## 2017-09-27 ENCOUNTER — Ambulatory Visit (HOSPITAL_COMMUNITY): Payer: Self-pay | Admitting: Psychology

## 2017-09-27 ENCOUNTER — Ambulatory Visit (HOSPITAL_COMMUNITY): Payer: Self-pay | Admitting: Licensed Clinical Social Worker

## 2017-09-28 ENCOUNTER — Other Ambulatory Visit: Payer: Self-pay | Admitting: Emergency Medicine

## 2017-09-28 DIAGNOSIS — E118 Type 2 diabetes mellitus with unspecified complications: Secondary | ICD-10-CM

## 2017-09-28 DIAGNOSIS — G44219 Episodic tension-type headache, not intractable: Secondary | ICD-10-CM

## 2017-09-28 DIAGNOSIS — M542 Cervicalgia: Secondary | ICD-10-CM

## 2017-09-28 DIAGNOSIS — Z794 Long term (current) use of insulin: Secondary | ICD-10-CM

## 2017-09-28 MED ORDER — METFORMIN HCL 500 MG PO TABS: ORAL_TABLET | ORAL | 0 refills | Status: DC

## 2017-09-28 MED ORDER — NAPROXEN 250 MG PO TABS: 250.0000 mg | ORAL_TABLET | Freq: Two times a day (BID) | ORAL | 0 refills | Status: DC | PRN

## 2017-09-28 NOTE — Telephone Encounter (Signed)
Called and spoke with patient informing her that refill request has been sent in for a 30 day supply. Patient expressed that she is not transferring to new location with PCP. I informed her to contact the [facility] [place] location to establish care with a PCP for further refills. Understanding verbalized nothing further needed at this time.

## 2017-09-29 ENCOUNTER — Ambulatory Visit (HOSPITAL_COMMUNITY): Payer: Self-pay | Admitting: Psychology

## 2017-09-30 ENCOUNTER — Other Ambulatory Visit (HOSPITAL_COMMUNITY): Payer: Self-pay | Admitting: Psychiatry

## 2017-09-30 DIAGNOSIS — F3131 Bipolar disorder, current episode depressed, mild: Secondary | ICD-10-CM

## 2017-10-04 ENCOUNTER — Ambulatory Visit (INDEPENDENT_AMBULATORY_CARE_PROVIDER_SITE_OTHER): Payer: Medicare Other | Admitting: Licensed Clinical Social Worker

## 2017-10-04 ENCOUNTER — Ambulatory Visit (HOSPITAL_COMMUNITY): Payer: Self-pay | Admitting: Psychology

## 2017-10-04 ENCOUNTER — Encounter (HOSPITAL_COMMUNITY): Payer: Self-pay | Admitting: Licensed Clinical Social Worker

## 2017-10-04 DIAGNOSIS — F3131 Bipolar disorder, current episode depressed, mild: Secondary | ICD-10-CM

## 2017-10-04 NOTE — Progress Notes (Signed)
   THERAPIST PROGRESS NOTE  Session Time: 2:30pm-3:30pm  Participation Level: Active  Behavioral Response: CasualAlertDepressed  Type of Therapy: Individual Therapy  Treatment Goals addressed: Coping and Diagnosis: Bipolar Affective Disorder, currently depressed, mild  Interventions: CBT and Motivational Interviewing  Summary: Jodi Wolfe is a 46 y.o. female who presents with Bipolar Affective Disorder, currently depressed, mild.   Suicidal/Homicidal: Nowithout intent/plan  Therapist Response: Denora engaged well in session. Clinician built rapport and assessed current functioning. Chiyoko and clinician identified treatment goals and created a treatment plan focusing on self-compassion and being able to improve her relationship with her children. Clinician provided first two modules in [facility] "Building Self-Compassion" workbook in order to assist [name] in her ability to forgive herself for her past mistakes and make improvements in daily self esteem. Clinician also identified presence of guilt and Lilinoe's need to "prove herself to her children".   Plan: Return again in 2 weeks.  Diagnosis: Axis I: Bipolar affective disorder, currently depressed, mild        Veneda Melter, LCSW 10/04/2017

## 2017-10-07 ENCOUNTER — Telehealth (HOSPITAL_COMMUNITY): Payer: Self-pay

## 2017-10-07 NOTE — Telephone Encounter (Signed)
Medication refill request - Fax received from patient's [facility] requesting a new 90 day order for her prescribed chlorpromazine.  Patient returns 10/11/17.

## 2017-10-08 ENCOUNTER — Other Ambulatory Visit (HOSPITAL_COMMUNITY): Payer: Self-pay | Admitting: Psychiatry

## 2017-10-08 NOTE — Telephone Encounter (Signed)
We'll discuss on her next appointment.

## 2017-10-10 ENCOUNTER — Other Ambulatory Visit (HOSPITAL_COMMUNITY): Payer: Self-pay | Admitting: Psychiatry

## 2017-10-10 DIAGNOSIS — F3131 Bipolar disorder, current episode depressed, mild: Secondary | ICD-10-CM

## 2017-10-11 ENCOUNTER — Ambulatory Visit (HOSPITAL_COMMUNITY): Payer: Self-pay | Admitting: Psychiatry

## 2017-10-11 ENCOUNTER — Encounter: Payer: Self-pay | Admitting: Family Medicine

## 2017-10-11 ENCOUNTER — Encounter (HOSPITAL_COMMUNITY): Payer: Self-pay | Admitting: Psychiatry

## 2017-10-11 ENCOUNTER — Ambulatory Visit (INDEPENDENT_AMBULATORY_CARE_PROVIDER_SITE_OTHER): Payer: Medicare Other | Admitting: Psychiatry

## 2017-10-11 DIAGNOSIS — F3131 Bipolar disorder, current episode depressed, mild: Secondary | ICD-10-CM

## 2017-10-11 DIAGNOSIS — R4587 Impulsiveness: Secondary | ICD-10-CM

## 2017-10-11 DIAGNOSIS — F410 Panic disorder [episodic paroxysmal anxiety] without agoraphobia: Secondary | ICD-10-CM | POA: Diagnosis not present

## 2017-10-11 DIAGNOSIS — M2550 Pain in unspecified joint: Secondary | ICD-10-CM | POA: Diagnosis not present

## 2017-10-11 DIAGNOSIS — F419 Anxiety disorder, unspecified: Secondary | ICD-10-CM | POA: Diagnosis not present

## 2017-10-11 DIAGNOSIS — R4586 Emotional lability: Secondary | ICD-10-CM | POA: Diagnosis not present

## 2017-10-11 DIAGNOSIS — F39 Unspecified mood [affective] disorder: Secondary | ICD-10-CM

## 2017-10-11 DIAGNOSIS — M549 Dorsalgia, unspecified: Secondary | ICD-10-CM

## 2017-10-11 DIAGNOSIS — Z818 Family history of other mental and behavioral disorders: Secondary | ICD-10-CM | POA: Diagnosis not present

## 2017-10-11 DIAGNOSIS — Z813 Family history of other psychoactive substance abuse and dependence: Secondary | ICD-10-CM | POA: Diagnosis not present

## 2017-10-11 DIAGNOSIS — R450 Nervousness: Secondary | ICD-10-CM

## 2017-10-11 MED ORDER — LAMOTRIGINE 150 MG PO TABS: 150.0000 mg | ORAL_TABLET | Freq: Every day | ORAL | 0 refills | Status: DC

## 2017-10-11 MED ORDER — CLONAZEPAM 0.5 MG PO TABS: 0.5000 mg | ORAL_TABLET | Freq: Two times a day (BID) | ORAL | 2 refills | Status: DC | PRN

## 2017-10-11 MED ORDER — CHLORPROMAZINE HCL 50 MG PO TABS: 50.0000 mg | ORAL_TABLET | Freq: Every day | ORAL | 0 refills | Status: DC
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
## 2017-10-12 ENCOUNTER — Other Ambulatory Visit: Payer: Self-pay | Admitting: Family Medicine

## 2017-10-12 DIAGNOSIS — M25551 Pain in right hip: Secondary | ICD-10-CM

## 2017-10-12 DIAGNOSIS — Z794 Long term (current) use of insulin: Secondary | ICD-10-CM

## 2017-10-12 DIAGNOSIS — M549 Dorsalgia, unspecified: Secondary | ICD-10-CM

## 2017-10-12 DIAGNOSIS — G8929 Other chronic pain: Secondary | ICD-10-CM

## 2017-10-12 DIAGNOSIS — G44219 Episodic tension-type headache, not intractable: Secondary | ICD-10-CM

## 2017-10-12 DIAGNOSIS — E118 Type 2 diabetes mellitus with unspecified complications: Secondary | ICD-10-CM

## 2017-10-12 NOTE — Telephone Encounter (Signed)
Patient calling to check the status for the note below. Patient is aware that Dr. Earlene Plater is out today and the nurse sent Dr. Earlene Plater a note and they will see if they can fill.

## 2017-10-13 ENCOUNTER — Other Ambulatory Visit: Payer: Self-pay | Admitting: Surgical

## 2017-10-13 DIAGNOSIS — E118 Type 2 diabetes mellitus with unspecified complications: Secondary | ICD-10-CM

## 2017-10-13 DIAGNOSIS — G8929 Other chronic pain: Secondary | ICD-10-CM

## 2017-10-13 DIAGNOSIS — G44219 Episodic tension-type headache, not intractable: Secondary | ICD-10-CM

## 2017-10-13 DIAGNOSIS — M25551 Pain in right hip: Secondary | ICD-10-CM

## 2017-10-13 DIAGNOSIS — M549 Dorsalgia, unspecified: Secondary | ICD-10-CM

## 2017-10-13 DIAGNOSIS — Z794 Long term (current) use of insulin: Secondary | ICD-10-CM

## 2017-10-13 MED ORDER — GABAPENTIN 300 MG PO CAPS: 600.0000 mg | ORAL_CAPSULE | Freq: Two times a day (BID) | ORAL | 1 refills | Status: DC

## 2017-10-13 MED ORDER — CYCLOBENZAPRINE HCL 10 MG PO TABS: 5.0000 mg | ORAL_TABLET | Freq: Two times a day (BID) | ORAL | 1 refills | Status: DC | PRN

## 2017-10-18 ENCOUNTER — Encounter: Payer: Self-pay | Admitting: Family Medicine

## 2017-10-18 ENCOUNTER — Other Ambulatory Visit: Payer: Self-pay | Admitting: Surgical

## 2017-10-18 ENCOUNTER — Ambulatory Visit (HOSPITAL_COMMUNITY): Payer: Self-pay | Admitting: Psychology

## 2017-10-18 ENCOUNTER — Ambulatory Visit (HOSPITAL_COMMUNITY): Payer: Self-pay | Admitting: Licensed Clinical Social Worker

## 2017-10-18 ENCOUNTER — Ambulatory Visit: Payer: Medicare Other | Admitting: Podiatry

## 2017-10-18 DIAGNOSIS — K219 Gastro-esophageal reflux disease without esophagitis: Secondary | ICD-10-CM

## 2017-10-18 MED ORDER — ESOMEPRAZOLE MAGNESIUM 40 MG PO CPDR: 40.0000 mg | DELAYED_RELEASE_CAPSULE | Freq: Every day | ORAL | 1 refills | Status: DC

## 2017-10-25 ENCOUNTER — Ambulatory Visit: Payer: Medicare Other | Admitting: Family Medicine

## 2017-10-25 DIAGNOSIS — Z0289 Encounter for other administrative examinations: Secondary | ICD-10-CM

## 2017-11-01 ENCOUNTER — Ambulatory Visit (INDEPENDENT_AMBULATORY_CARE_PROVIDER_SITE_OTHER): Payer: Medicare Other | Admitting: Licensed Clinical Social Worker

## 2017-11-01 ENCOUNTER — Encounter: Payer: Self-pay | Admitting: Surgical

## 2017-11-01 ENCOUNTER — Ambulatory Visit (INDEPENDENT_AMBULATORY_CARE_PROVIDER_SITE_OTHER): Payer: Medicare Other | Admitting: Family Medicine

## 2017-11-01 ENCOUNTER — Encounter (HOSPITAL_COMMUNITY): Payer: Self-pay | Admitting: Licensed Clinical Social Worker

## 2017-11-01 ENCOUNTER — Ambulatory Visit (HOSPITAL_COMMUNITY): Payer: Self-pay | Admitting: Psychology

## 2017-11-01 VITALS — BP 118/76 | HR 106 | Temp 98.1°F | Ht 64.0 in | Wt 247.6 lb

## 2017-11-01 DIAGNOSIS — M5432 Sciatica, left side: Secondary | ICD-10-CM | POA: Diagnosis not present

## 2017-11-01 DIAGNOSIS — F3131 Bipolar disorder, current episode depressed, mild: Secondary | ICD-10-CM | POA: Diagnosis not present

## 2017-11-01 DIAGNOSIS — Z6841 Body Mass Index (BMI) 40.0 and over, adult: Secondary | ICD-10-CM | POA: Diagnosis not present

## 2017-11-01 MED ORDER — HYDROCODONE-ACETAMINOPHEN 5-325 MG PO TABS: 1.0000 | ORAL_TABLET | Freq: Three times a day (TID) | ORAL | 0 refills | Status: DC | PRN
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
## 2017-11-01 NOTE — Progress Notes (Signed)
Jodi Wolfe is a 46 y.o. female is here to [place].   Patient Care Team: Briscoe Deutscher, DO as PCP - General (Family Medicine)   History of Present Illness:   HPI:   1. Sciatica of left side. Pain from lumbar spine to left leg. Worse over the past few days. Hx of the same and was previously controlled with epidural injections and Norco before moving to [place]. 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 No bowel or bladder changes. Okay for records from previous pain clinic.     2. Class 3 severe obesity due to excess calories with serious comorbidity and body mass index (BMI) of 40.0 to 44.9 in adult [facility]).   Wt Readings from Last 3 Encounters:  11/01/17 247 lb 9.6 oz (112.3 kg)  08/16/17 242 lb 12.8 oz (110.1 kg)  07/02/17 239 lb 6.4 oz (108.6 kg)     Health Maintenance Due  Topic Date Due  . PNEUMOCOCCAL POLYSACCHARIDE VACCINE (1) 12/14/1972  . HIV Screening  12/14/1985  . INFLUENZA VACCINE  07/28/2017   Depression screen PHQ 2/9 04/19/2017  Decreased Interest 0  Down, Depressed, Hopeless 0  PHQ - 2 Score 0   PMHx, SurgHx, SocialHx, Medications, and Allergies were reviewed in the Visit Navigator and updated as appropriate.   Past Medical History:  Diagnosis Date  . Anxiety   . Depression   . Diabetes mellitus without complication ([place])   . GERD (gastroesophageal reflux disease)   . Headache   . Neuropathy     Past Surgical History:  Procedure Laterality Date  . APPENDECTOMY    . CHOLECYSTECTOMY    . TONSILLECTOMY AND ADENOIDECTOMY      Family History  Problem Relation Age of Onset  . Hyperlipidemia Mother   . Hypertension Mother   . Stroke Mother   . Depression Mother   . Drug abuse Mother   . Hyperlipidemia Father   . Hypertension Father   . Stroke Father   . Bipolar disorder Father   . Hyperlipidemia Brother   . Hypertension Brother   . Anxiety disorder Brother    Social History   Tobacco Use  . Smoking status: Never Smoker   . Smokeless tobacco: Never Used  Substance Use Topics  . Alcohol use: No  . Drug use: No    Current Medications and Allergies:   .  blood glucose meter kit and supplies KIT, Dispense based on patient and insurance preference. Use up to four times daily as directed. (FOR ICD-9 250.00, 250.01)., Disp: 1 each, Rfl: 0 .  chlorproMAZINE (THORAZINE) 50 MG tablet, Take 1 tablet (50 mg total) by mouth at bedtime., Disp: 90 tablet, Rfl: 0 .  clonazePAM (KLONOPIN) 0.5 MG tablet, Take 1 tablet (0.5 mg total) by mouth 2 (two) times daily as needed for anxiety., Disp: 60 tablet, Rfl: 2 .  CONTOUR NEXT TEST test strip, USE TO CHECK BLOOD SUGAR UP TO QID PRN, Disp: 100 each, Rfl: 5 .  cyclobenzaprine (FLEXERIL) 10 MG tablet, Take 0.5-1 tablets (5-10 mg total) by mouth 2 (two) times daily as needed for muscle spasms., Disp: 60 tablet, Rfl: 1 .  esomeprazole (NEXIUM) 40 MG capsule, Take 1 capsule (40 mg total) by mouth daily., Disp: 90 capsule, Rfl: 1 .  gabapentin (NEURONTIN) 300 MG capsule, Take 2 capsules (600 mg total) by mouth 2 (two) times daily., Disp: 120 capsule, Rfl: 1 .  Insulin Detemir (LEVEMIR FLEXPEN) 100 UNIT/ML Pen, Inject 20 Units into the  skin daily at 10 pm., Disp: 15 mL, Rfl: 11 .  Insulin Pen Needle (PEN NEEDLES) 31G X 5 MM MISC, 1 each by Does not apply route daily., Disp: 100 each, Rfl: 4 .  lamoTRIgine (LAMICTAL) 150 MG tablet, Take 1 tablet (150 mg total) by mouth daily., Disp: 90 tablet, Rfl: 0 .  metFORMIN (GLUCOPHAGE) 500 MG tablet, Take 3 tablets with Breakfast and 2 tablets with dinner., Disp: 150 tablet, Rfl: 0 .  MICROLET LANCETS MISC, USE TO CHECK BLOOD SUGAR UP TO QID PRN, Disp: 100 each, Rfl: 12 .  naproxen (NAPROSYN) 250 MG tablet, Take 1-2 tablets (250-500 mg total) by mouth 2 (two) times daily as needed. Take with food., Disp: 30 tablet, Rfl: 0 .  ondansetron (ZOFRAN-ODT) 8 MG disintegrating tablet, Take 1 tablet (8 mg total) by mouth every 12 (twelve) hours as needed for  nausea or vomiting., Disp: 30 tablet, Rfl: 1   Allergies  Allergen Reactions  . Morphine And Related   . Sulfa Antibiotics    Review of Systems:   Pertinent items are noted in the HPI. Otherwise, ROS is negative.  Vitals:   Vitals:   11/01/17 1435  BP: 118/76  Pulse: (!) 106  Temp: 98.1 F (36.7 C)  TempSrc: Oral  SpO2: 94%  Weight: 247 lb 9.6 oz (112.3 kg)  Height: '5\' 4"'$  (1.626 m)     Body mass index is 42.5 kg/m. Physical Exam:   Physical Exam  Constitutional: She appears well-nourished.  HENT:  Head: Normocephalic and atraumatic.  Eyes: EOM are normal. Pupils are equal, round, and reactive to light.  Neck: Normal range of motion. Neck supple.  Cardiovascular: Normal rate, regular rhythm, normal heart sounds and intact distal pulses.  Pulmonary/Chest: Effort normal.  Abdominal: Soft.  Musculoskeletal:       Lumbar back: She exhibits decreased range of motion, pain and spasm.  + SLR right side.  Skin: Skin is warm.  Psychiatric: She has a normal mood and affect. Her behavior is normal.  Nursing note and vitals reviewed.  Results for orders placed or performed in visit on 08/16/17  POC Glucose (CBG)  Result Value Ref Range   POC Glucose 316 (A) 70 - 99 mg/dl  POCT glycosylated hemoglobin (Hb A1C)  Result Value Ref Range   Hemoglobin A1C 8.7    Assessment and Plan:   Jodi Wolfe was seen today for follow-up and peripheral neuropathy.  Diagnoses and all orders for this visit:  Sciatica of left side Comments: We had a long discussion about chronic pain medications, the risks, benefits, potential side effects of medication.  We reviewed her pain contract in detail.  The patient understands the contract and is willing to abide by it.  She has signed this and we will scan it into her record. She understands that she must take the medication as prescribed, she cannot obtain narcotic medication from any other source, she is not to request refills early, she is not to  escalate the dose without a discussion.    She understands that narcotic prescriptions cannot be called in, mailed, or faxed. She will need to pick them up in person.  We reviewed that I prescribed using a 28-day schedule to ensure there is no confusion about weekend refills.  She understands that she will need to call a week in advance when she needs medication refills.   -     HYDROcodone-acetaminophen (NORCO/VICODIN) 5-325 MG tablet; Take 1 tablet 3 (three) times daily as needed by  mouth for moderate pain. -     Ambulatory referral to Physical Medicine Rehab  Class 3 severe obesity due to excess calories with serious comorbidity and body mass index (BMI) of 40.0 to 44.9 in adult [facility]) Comments: The patient is asked to make an attempt to improve diet and exercise patterns to aid in medical management of this problem.    . Reviewed expectations re: course of current medical issues. . Discussed self-management of symptoms. . Outlined signs and symptoms indicating need for more acute intervention. . Patient verbalized understanding and all questions were answered. Marland Kitchen Health Maintenance issues including appropriate healthy diet, exercise, and smoking avoidance were discussed with patient. . See orders for this visit as documented in the electronic medical record. . Patient received an After Visit Summary.  Briscoe Deutscher, DO [place] 11/02/2017  Future Appointments  Date Time Provider [place]  11/15/2017  2:30 PM Mindi Curling [facility] None  11/29/2017  3:00 PM Briscoe Deutscher, DO [facility] None  01/11/2018  4:30 PM Arfeen, Arlyce Harman, MD [facility] None

## 2017-11-02 ENCOUNTER — Encounter: Payer: Self-pay | Admitting: Family Medicine

## 2017-11-03 ENCOUNTER — Encounter: Payer: Self-pay | Admitting: Family Medicine

## 2017-11-03 ENCOUNTER — Other Ambulatory Visit: Payer: Self-pay | Admitting: Family Medicine

## 2017-11-03 DIAGNOSIS — E118 Type 2 diabetes mellitus with unspecified complications: Secondary | ICD-10-CM

## 2017-11-03 DIAGNOSIS — Z794 Long term (current) use of insulin: Secondary | ICD-10-CM

## 2017-11-04 ENCOUNTER — Other Ambulatory Visit: Payer: Self-pay

## 2017-11-04 MED ORDER — ATORVASTATIN CALCIUM 10 MG PO TABS: 10.0000 mg | ORAL_TABLET | Freq: Every day | ORAL | 1 refills | Status: DC

## 2017-11-04 NOTE — Telephone Encounter (Signed)
Please Advise

## 2017-11-04 NOTE — Telephone Encounter (Signed)
Rx for atorvastatin sent to patient's pharmacy.

## 2017-11-06 ENCOUNTER — Encounter: Payer: Self-pay | Admitting: Family Medicine

## 2017-11-15 ENCOUNTER — Ambulatory Visit (HOSPITAL_COMMUNITY): Payer: Self-pay | Admitting: Licensed Clinical Social Worker

## 2017-11-15 ENCOUNTER — Ambulatory Visit (HOSPITAL_COMMUNITY): Payer: Self-pay | Admitting: Psychology

## 2017-11-15 ENCOUNTER — Institutional Professional Consult (permissible substitution) (INDEPENDENT_AMBULATORY_CARE_PROVIDER_SITE_OTHER): Payer: Medicare Other | Admitting: Physical Medicine and Rehabilitation

## 2017-11-29 ENCOUNTER — Ambulatory Visit (INDEPENDENT_AMBULATORY_CARE_PROVIDER_SITE_OTHER): Payer: Medicare Other | Admitting: Family Medicine

## 2017-11-29 ENCOUNTER — Encounter: Payer: Self-pay | Admitting: Family Medicine

## 2017-11-29 VITALS — BP 120/72 | HR 78 | Temp 98.8°F | Wt 244.4 lb

## 2017-11-29 DIAGNOSIS — M5432 Sciatica, left side: Secondary | ICD-10-CM | POA: Diagnosis not present

## 2017-11-29 DIAGNOSIS — Z6841 Body Mass Index (BMI) 40.0 and over, adult: Secondary | ICD-10-CM

## 2017-11-29 DIAGNOSIS — Z79899 Other long term (current) drug therapy: Secondary | ICD-10-CM | POA: Diagnosis not present

## 2017-11-29 DIAGNOSIS — Z23 Encounter for immunization: Secondary | ICD-10-CM | POA: Diagnosis not present

## 2017-11-29 DIAGNOSIS — Z794 Long term (current) use of insulin: Secondary | ICD-10-CM

## 2017-11-29 DIAGNOSIS — E118 Type 2 diabetes mellitus with unspecified complications: Secondary | ICD-10-CM

## 2017-11-29 MED ORDER — HYDROCODONE-ACETAMINOPHEN 5-325 MG PO TABS: 1.0000 | ORAL_TABLET | Freq: Three times a day (TID) | ORAL | 0 refills | Status: DC | PRN

## 2017-11-29 MED ORDER — ETODOLAC 400 MG PO TABS: 400.0000 mg | ORAL_TABLET | Freq: Two times a day (BID) | ORAL | 3 refills | Status: DC

## 2017-11-29 MED ORDER — GABAPENTIN 600 MG PO TABS: 600.0000 mg | ORAL_TABLET | Freq: Three times a day (TID) | ORAL | 3 refills | Status: DC

## 2017-11-29 NOTE — Progress Notes (Signed)
Jodi Wolfe is a 46 y.o. female is here for follow up.  History of Present Illness:   HPI: See Assessment and Plan section for Problem Based Charting of issues discussed today.  Health Maintenance Due  Topic Date Due  . PNEUMOCOCCAL POLYSACCHARIDE VACCINE (1) 12/14/1972   Depression screen PHQ 2/9 04/19/2017  Decreased Interest 0  Down, Depressed, Hopeless 0  PHQ - 2 Score 0   PMHx, SurgHx, SocialHx, FamHx, Medications, and Allergies were reviewed in the Visit Navigator and updated as appropriate.   Patient Active Problem List   Diagnosis Date Noted  . Class 3 severe obesity due to excess calories with serious comorbidity and body mass index (BMI) of 40.0 to 44.9 in adult ([place]) 04/19/2017  . GAD (generalized anxiety disorder) 04/19/2017  . Psychophysiological insomnia 04/19/2017  . Type 2 diabetes mellitus with complication, with long-term current use of insulin ([place]) 04/19/2017   Social History   Tobacco Use  . Smoking status: Never Smoker  . Smokeless tobacco: Never Used  Substance Use Topics  . Alcohol use: No  . Drug use: No   Current Medications and Allergies:   .  atorvastatin (LIPITOR) 10 MG tablet, Take 1 tablet (10 mg total) daily by mouth., Disp: 90 tablet, Rfl: 1 .  blood glucose meter kit and supplies KIT, Dispense based on patient and insurance preference. Use up to four times daily as directed. (FOR ICD-9 250.00, 250.01)., Disp: 1 each, Rfl: 0 .  chlorproMAZINE (THORAZINE) 50 MG tablet, Take 1 tablet (50 mg total) by mouth at bedtime., Disp: 90 tablet, Rfl: 0 .  clonazePAM (KLONOPIN) 0.5 MG tablet, Take 1 tablet (0.5 mg total) by mouth 2 (two) times daily as needed for anxiety., Disp: 60 tablet, Rfl: 2 .  CONTOUR NEXT TEST test strip, USE TO CHECK BLOOD SUGAR UP TO QID PRN, Disp: 100 each, Rfl: 5 .  cyclobenzaprine (FLEXERIL) 10 MG tablet, Take 0.5-1 tablets (5-10 mg total) by mouth 2 (two) times daily as needed for muscle spasms., Disp: 60 tablet, Rfl: 1 .   esomeprazole (NEXIUM) 40 MG capsule, Take 1 capsule (40 mg total) by mouth daily., Disp: 90 capsule, Rfl: 1 .  gabapentin (NEURONTIN) 300 MG capsule, Take 2 capsules (600 mg total) by mouth 2 (two) times daily., Disp: 120 capsule, Rfl: 1 .  HYDROcodone-acetaminophen (NORCO/VICODIN) 5-325 MG tablet, Take 1 tablet 3 (three) times daily as needed by mouth for moderate pain., Disp: 90 tablet, Rfl: 0 .  Insulin Detemir (LEVEMIR FLEXPEN) 100 UNIT/ML Pen, Inject 20 Units into the skin daily at 10 pm., Disp: 15 mL, Rfl: 11 .  Insulin Pen Needle (PEN NEEDLES) 31G X 5 MM MISC, 1 each by Does not apply route daily., Disp: 100 each, Rfl: 4 .  lamoTRIgine (LAMICTAL) 150 MG tablet, Take 1 tablet (150 mg total) by mouth daily., Disp: 90 tablet, Rfl: 0 .  metFORMIN (GLUCOPHAGE) 500 MG tablet, TAKE 3 TABLETS WITH BREAKFAST AND 2 TABLETS WITH DINNER. Marland Kitchen  MICROLET LANCETS MISC, USE TO CHECK BLOOD SUGAR UP TO QID PRN, Disp: 100 each, Rfl: 12 .  naproxen (NAPROSYN) 250 MG tablet, Take 1-2 tablets (250-500 mg total) by mouth 2 (two) times daily as needed. Take with food., Disp: 30 tablet, Rfl: 0 .  ondansetron (ZOFRAN-ODT) 8 MG disintegrating tablet, Take 1 tablet (8 mg total) by mouth every 12 (twelve) hours as needed for nausea or vomiting., Disp: 30 tablet, Rfl: 1  Allergies  Allergen Reactions  . Morphine And Related   .  Sulfa Antibiotics    Review of Systems   Pertinent items are noted in the HPI. Otherwise, ROS is negative.  Vitals:   Vitals:   11/29/17 1459  BP: 120/72  Pulse: 78  Temp: 98.8 F (37.1 C)  TempSrc: Oral  SpO2: 98%  Weight: 244 lb 6.4 oz (110.9 kg)     Body mass index is 41.95 kg/m.   Physical Exam:   Physical Exam  Constitutional: She appears well-nourished.  HENT:  Head: Normocephalic and atraumatic.  Eyes: EOM are normal. Pupils are equal, round, and reactive to light.  Neck: Normal range of motion. Neck supple.  Cardiovascular: Normal rate, regular rhythm, normal heart  sounds and intact distal pulses.  Pulmonary/Chest: Effort normal.  Abdominal: Soft.  Musculoskeletal:       Lumbar back: She exhibits decreased range of motion, pain and spasm.  + SLR right side.  Skin: Skin is warm.  Psychiatric: She has a normal mood and affect. Her behavior is normal.  Nursing note and vitals reviewed.   Assessment and Plan:   [name] was seen today for follow-up.  Diagnoses and all orders for this visit:  Sciatica of left side Comments: Appointment with Dr. Ernestina Patches on 12/10/17 at 4 pm.  Orders: -     gabapentin (NEURONTIN) 600 MG tablet; Take 1 tablet (600 mg total) by mouth 3 (three) times daily. -     etodolac (LODINE) 400 MG tablet; Take 1 tablet (400 mg total) by mouth 2 (two) times daily. -     HYDROcodone-acetaminophen (NORCO/VICODIN) 5-325 MG tablet; Take 1 tablet by mouth 3 (three) times daily as needed for moderate pain. -     MR Lumbar Spine Wo Contrast; Future  Medication management -     Pain Mgmt, Profile 8 w/Conf, U  Type 2 diabetes mellitus with complication, with long-term current use of insulin ([facility]) Comments: Current symptoms: no polyuria or polydipsia, no chest pain, dyspnea or TIA's.  Taking medication compliantly without noted sided effects '[x]'$   YES  '[]'$   NO  Episodes of hypoglycemia? '[]'$   YES  '[x]'$   NO Maintaining a diabetic diet? '[]'$   YES  '[x]'$   NO Trying to exercise on a regular basis? '[]'$   YES  '[x]'$   NO  On ACE inhibitor or angiotensin II receptor blocker? '[]'$   YES  '[x]'$   NO On Aspirin? '[]'$   YES  '[x]'$   NO  Lab Results  Component Value Date   HGBA1C 11.9 (H) 11/29/2017    Lab Results  Component Value Date   MICROALBUR 1.6 04/19/2017    Lab Results  Component Value Date   CHOL 235 (H) 04/19/2017   HDL 59.50 04/19/2017   LDLCALC 149 (H) 04/19/2017   TRIG 131.0 04/19/2017   CHOLHDL 4 04/19/2017     Wt Readings from Last 3 Encounters:  11/29/17 244 lb 6.4 oz (110.9 kg)  11/01/17 247 lb 9.6 oz (112.3 kg)  08/16/17 242 lb 12.8 oz  (110.1 kg)   BP Readings from Last 3 Encounters:  11/29/17 120/72  11/01/17 118/76  08/16/17 112/80   Lab Results  Component Value Date   CREATININE 0.60 11/29/2017   Orders: -     Hemoglobin A1c  Class 3 severe obesity due to excess calories with serious comorbidity and body mass index (BMI) of 40.0 to 44.9 in adult [facility]) Comments: The patient is asked to make an attempt to improve diet and exercise patterns to aid in medical management of this problem.  Orders: -  CBC with Differential/Platelet -     Comprehensive metabolic panel  Need for immunization against influenza -     Flu Vaccine QUAD 36+ mos IM    . Reviewed expectations re: course of current medical issues. . Discussed self-management of symptoms. . Outlined signs and symptoms indicating need for more acute intervention. . Patient verbalized understanding and all questions were answered. Marland Kitchen Health Maintenance issues including appropriate healthy diet, exercise, and smoking avoidance were discussed with patient. . See orders for this visit as documented in the electronic medical record. . Patient received an After Visit Summary.  Briscoe Deutscher, DO [place] 12/02/2017  Future Appointments  Date Time Provider [place]  12/09/2017  4:00 PM Magnus Sinning, MD [facility] None  01/11/2018  4:30 PM Arfeen, Arlyce Harman, MD [facility] None  02/14/2018  4:30 PM Berenice Bouton, Jobe Marker [facility] None

## 2017-11-30 ENCOUNTER — Other Ambulatory Visit: Payer: Self-pay

## 2017-11-30 ENCOUNTER — Institutional Professional Consult (permissible substitution) (INDEPENDENT_AMBULATORY_CARE_PROVIDER_SITE_OTHER): Payer: Medicare Other | Admitting: Physical Medicine and Rehabilitation

## 2017-11-30 LAB — CBC WITH DIFFERENTIAL/PLATELET
Basophils Absolute: 0 10*3/uL (ref 0.0–0.1)
Basophils Relative: 0.4 % (ref 0.0–3.0)
Eosinophils Absolute: 0.3 10*3/uL (ref 0.0–0.7)
Eosinophils Relative: 2.3 % (ref 0.0–5.0)
HCT: 39.6 % (ref 36.0–46.0)
Hemoglobin: 12.4 g/dL (ref 12.0–15.0)
Lymphocytes Relative: 26.5 % (ref 12.0–46.0)
Lymphs Abs: 3.1 10*3/uL (ref 0.7–4.0)
MCHC: 31.4 g/dL (ref 30.0–36.0)
MCV: 85.8 fl (ref 78.0–100.0)
Monocytes Absolute: 0.5 10*3/uL (ref 0.1–1.0)
Monocytes Relative: 4.3 % (ref 3.0–12.0)
Neutro Abs: 7.7 10*3/uL (ref 1.4–7.7)
Neutrophils Relative %: 66.5 % (ref 43.0–77.0)
Platelets: 250 10*3/uL (ref 150.0–400.0)
RBC: 4.62 Mil/uL (ref 3.87–5.11)
RDW: 16.4 % — ABNORMAL HIGH (ref 11.5–15.5)
WBC: 11.6 10*3/uL — ABNORMAL HIGH (ref 4.0–10.5)

## 2017-11-30 LAB — HEMOGLOBIN A1C: Hgb A1c MFr Bld: 11.9 % — ABNORMAL HIGH (ref 4.6–6.5)

## 2017-11-30 LAB — COMPREHENSIVE METABOLIC PANEL
ALT: 30 U/L (ref 0–35)
AST: 27 U/L (ref 0–37)
Albumin: 4.7 g/dL (ref 3.5–5.2)
Alkaline Phosphatase: 99 U/L (ref 39–117)
BUN: 11 mg/dL (ref 6–23)
CO2: 22 mEq/L (ref 19–32)
Calcium: 9.9 mg/dL (ref 8.4–10.5)
Chloride: 96 mEq/L (ref 96–112)
Creatinine, Ser: 0.6 mg/dL (ref 0.40–1.20)
GFR: 113.91 mL/min (ref >60.00)
Glucose, Bld: 289 mg/dL — ABNORMAL HIGH (ref 70–99)
Potassium: 4.4 mEq/L (ref 3.5–5.1)
Sodium: 135 mEq/L (ref 135–145)
Total Bilirubin: 0.3 mg/dL (ref 0.2–1.2)
Total Protein: 8 g/dL (ref 6.0–8.3)

## 2017-11-30 MED ORDER — NAPROXEN 500 MG PO TABS: 500.0000 mg | ORAL_TABLET | Freq: Two times a day (BID) | ORAL | 0 refills | Status: DC

## 2017-12-02 DIAGNOSIS — M5432 Sciatica, left side: Secondary | ICD-10-CM | POA: Insufficient documentation

## 2017-12-03 LAB — PAIN MGMT, PROFILE 8 W/CONF, U
6 Acetylmorphine: NEGATIVE ng/mL (ref <10)
Alcohol Metabolites: NEGATIVE ng/mL (ref <500)
Alphahydroxyalprazolam: NEGATIVE ng/mL (ref <25)
Alphahydroxymidazolam: NEGATIVE ng/mL (ref <50)
Alphahydroxytriazolam: NEGATIVE ng/mL (ref <50)
Aminoclonazepam: 74 ng/mL — ABNORMAL HIGH (ref <25)
Amphetamines: NEGATIVE ng/mL (ref <500)
Benzodiazepines: POSITIVE ng/mL — AB (ref <100)
Buprenorphine, Urine: NEGATIVE ng/mL (ref <5)
Cocaine Metabolite: NEGATIVE ng/mL (ref <150)
Codeine: NEGATIVE ng/mL (ref <50)
Creatinine: 115.8 mg/dL
Hydrocodone: 114 ng/mL — ABNORMAL HIGH (ref <50)
Hydromorphone: NEGATIVE ng/mL (ref <50)
Hydroxyethylflurazepam: NEGATIVE ng/mL (ref <50)
Lorazepam: NEGATIVE ng/mL (ref <50)
MDMA: NEGATIVE ng/mL (ref <500)
Marijuana Metabolite: NEGATIVE ng/mL (ref <20)
Morphine: NEGATIVE ng/mL (ref <50)
Nordiazepam: NEGATIVE ng/mL (ref <50)
Norhydrocodone: 205 ng/mL — ABNORMAL HIGH (ref <50)
Opiates: POSITIVE ng/mL — AB (ref <100)
Oxazepam: NEGATIVE ng/mL (ref <50)
Oxidant: NEGATIVE ug/mL (ref <200)
Oxycodone: NEGATIVE ng/mL (ref <100)
Temazepam: NEGATIVE ng/mL (ref <50)
pH: 6.53 (ref 4.5–9.0)

## 2017-12-09 ENCOUNTER — Ambulatory Visit (INDEPENDENT_AMBULATORY_CARE_PROVIDER_SITE_OTHER): Payer: Medicare Other | Admitting: Physical Medicine and Rehabilitation

## 2017-12-09 ENCOUNTER — Ambulatory Visit (INDEPENDENT_AMBULATORY_CARE_PROVIDER_SITE_OTHER): Payer: Medicare Other

## 2017-12-09 ENCOUNTER — Encounter (INDEPENDENT_AMBULATORY_CARE_PROVIDER_SITE_OTHER): Payer: Self-pay | Admitting: Physical Medicine and Rehabilitation

## 2017-12-09 VITALS — BP 118/81 | HR 100 | Temp 97.8°F

## 2017-12-09 DIAGNOSIS — M25552 Pain in left hip: Secondary | ICD-10-CM | POA: Diagnosis not present

## 2017-12-09 DIAGNOSIS — G8929 Other chronic pain: Secondary | ICD-10-CM | POA: Diagnosis not present

## 2017-12-09 DIAGNOSIS — M5441 Lumbago with sciatica, right side: Secondary | ICD-10-CM

## 2017-12-09 DIAGNOSIS — M5416 Radiculopathy, lumbar region: Secondary | ICD-10-CM | POA: Diagnosis not present

## 2017-12-09 DIAGNOSIS — G894 Chronic pain syndrome: Secondary | ICD-10-CM

## 2017-12-09 NOTE — Progress Notes (Deleted)
Here for consult of lower spine referred by Dr. Earlene PlaterWallace for sciatic nerve. Pt has sharp pain more when walking and radiates and feels like it burning on left side of back. Night time is worse, today especially is having a lot of pain. Pt is taking hydrocodone 5-325 1 TID. [place], stomped 52 times with steel toe boots and pushed her face into brain. Two shot per month for year.

## 2017-12-13 ENCOUNTER — Ambulatory Visit (INDEPENDENT_AMBULATORY_CARE_PROVIDER_SITE_OTHER): Payer: Medicare Other | Admitting: Family Medicine

## 2017-12-13 ENCOUNTER — Encounter: Payer: Self-pay | Admitting: Family Medicine

## 2017-12-13 VITALS — BP 120/88 | HR 97 | Temp 97.7°F | Wt 247.6 lb

## 2017-12-13 DIAGNOSIS — M5432 Sciatica, left side: Secondary | ICD-10-CM | POA: Diagnosis not present

## 2017-12-13 DIAGNOSIS — Z6841 Body Mass Index (BMI) 40.0 and over, adult: Secondary | ICD-10-CM

## 2017-12-13 DIAGNOSIS — M25552 Pain in left hip: Secondary | ICD-10-CM | POA: Diagnosis not present

## 2017-12-13 DIAGNOSIS — E118 Type 2 diabetes mellitus with unspecified complications: Secondary | ICD-10-CM | POA: Diagnosis not present

## 2017-12-13 DIAGNOSIS — Z794 Long term (current) use of insulin: Secondary | ICD-10-CM

## 2017-12-13 DIAGNOSIS — G8929 Other chronic pain: Secondary | ICD-10-CM

## 2017-12-13 MED ORDER — DULAGLUTIDE 0.75 MG/0.5ML ~~LOC~~ SOAJ: 0.7500 mg | SUBCUTANEOUS | 0 refills | Status: DC

## 2017-12-13 MED ORDER — HYDROCODONE-ACETAMINOPHEN 10-325 MG PO TABS: 1.0000 | ORAL_TABLET | Freq: Three times a day (TID) | ORAL | 0 refills | Status: DC | PRN

## 2017-12-13 NOTE — Progress Notes (Addendum)
Jodi Wolfe is a 46 y.o. female is here for follow up.  History of Present Illness:   HPI:   1. Type 2 diabetes mellitus with complication, with long-term current use of insulin ([place]).  Current symptoms: no polyuria or polydipsia, no chest pain, dyspnea or TIA's, no hypoglycemia, no medication side effects noted, has dysesthesias in the feet.  Taking medication compliantly without noted sided effects _0   YES  _1   NO  Maintaining a diabetic diet? _2   YES  _3   NO Trying to exercise on a regular basis? _4   YES  _5   NO  On ACE inhibitor or angiotensin II receptor blocker? _6   YES  _7   NO On Aspirin? _8   YES  _9   NO  Lab Results  Component Value Date   HGBA1C 11.9 (H) 11/29/2017    Lab Results  Component Value Date   MICROALBUR 1.6 04/19/2017    Lab Results  Component Value Date   CHOL 235 (H) 04/19/2017   HDL 59.50 04/19/2017   LDLCALC 149 (H) 04/19/2017   TRIG 131.0 04/19/2017   CHOLHDL 4 04/19/2017     Wt Readings from Last 3 Encounters:  12/13/17 247 lb 9.6 oz (112.3 kg)  11/29/17 244 lb 6.4 oz (110.9 kg)  11/01/17 247 lb 9.6 oz (112.3 kg)   BP Readings from Last 3 Encounters:  12/13/17 120/88  12/09/17 118/81  11/29/17 120/72   Lab Results  Component Value Date   CREATININE 0.60 11/29/2017    2. Sciatica of left side. Patient continues to have severe pain in her low back with radiation to her left hip and down her left leg. She did see Dr. Ernestina Patches. Waiting for MRI to decide next step. Pain is severe. No new symptoms.    3. Class 3 severe obesity due to excess calories with serious comorbidity and body mass index (BMI) of 40.0 to 44.9 in adult [facility]).   Wt Readings from Last 3 Encounters:  12/13/17 247 lb 9.6 oz (112.3 kg)  11/29/17 244 lb 6.4 oz (110.9 kg)  11/01/17 247 lb 9.6 oz (112.3 kg)   The patient is asked to make an attempt to improve diet and exercise patterns to aid in medical management of this problem.    Health Maintenance Due  Topic Date  Due  . PNEUMOCOCCAL POLYSACCHARIDE VACCINE (1) 12/14/1972   Depression screen PHQ 2/9 04/19/2017  Decreased Interest 0  Down, Depressed, Hopeless 0  PHQ - 2 Score 0   PMHx, SurgHx, SocialHx, FamHx, Medications, and Allergies were reviewed in the Visit Navigator and updated as appropriate.   Patient Active Problem List   Diagnosis Date Noted  . Sciatica of left side 12/02/2017  . Class 3 severe obesity due to excess calories with serious comorbidity and body mass index (BMI) of 40.0 to 44.9 in adult ([place]) 04/19/2017  . GAD (generalized anxiety disorder) 04/19/2017  . Psychophysiological insomnia 04/19/2017  . Type 2 diabetes mellitus with complication, with long-term current use of insulin ([place]) 04/19/2017   Social History   Tobacco Use  . Smoking status: Never Smoker  . Smokeless tobacco: Never Used  Substance Use Topics  . Alcohol use: No  . Drug use: No   Current Medications and Allergies:   .  blood glucose meter kit and supplies KIT, Dispense based on patient and insurance preference. Use up to four times daily as directed. (FOR ICD-9 250.00, 250.01)., Disp: 1 each, Rfl: 0 .  chlorproMAZINE (THORAZINE) 50 MG tablet, Take 1  tablet (50 mg total) by mouth at bedtime., Disp: 90 tablet, Rfl: 0 .  clonazePAM (KLONOPIN) 0.5 MG tablet, Take 1 tablet (0.5 mg total) by mouth 2 (two) times daily as needed for anxiety., Disp: 60 tablet, Rfl: 2 .  CONTOUR NEXT TEST test strip, USE TO CHECK BLOOD SUGAR UP TO QID PRN, Disp: 100 each, Rfl: 5 .  esomeprazole (NEXIUM) 40 MG capsule, Take 1 capsule (40 mg total) by mouth daily., Disp: 90 capsule, Rfl: 1 .  gabapentin (NEURONTIN) 600 MG tablet, Take 1 tablet (600 mg total) by mouth 3 (three) times daily., Disp: 90 tablet, Rfl: 3 .  HYDROcodone-acetaminophen (NORCO/VICODIN) 5-325 MG tablet, Take 1 tablet by mouth 3 (three) times daily as needed for moderate pain., Disp: 90 tablet, Rfl: 0 .  Insulin Detemir (LEVEMIR FLEXPEN) 100 UNIT/ML Pen, Inject  20 Units into the skin daily at 10 pm., Disp: 15 mL, Rfl: 11 .  Insulin Pen Needle (PEN NEEDLES) 31G X 5 MM MISC, 1 each by Does not apply route daily., Disp: 100 each, Rfl: 4 .  lamoTRIgine (LAMICTAL) 150 MG tablet, Take 1 tablet (150 mg total) by mouth daily., Disp: 90 tablet, Rfl: 0 .  metFORMIN (GLUCOPHAGE) 500 MG tablet, TAKE 3 TABLETS WITH BREAKFAST AND 2 TABLETS WITH DINNER., Disp: 150 tablet, Rfl: 1 .  MICROLET LANCETS MISC, USE TO CHECK BLOOD SUGAR UP TO QID PRN, Disp: 100 each, Rfl: 12 .  naproxen (NAPROSYN) 500 MG tablet, Take 1 tablet (500 mg total) by mouth 2 (two) times daily with a meal., Disp: 60 tablet, Rfl: 0  Allergies  Allergen Reactions  . Morphine And Related   . Sulfa Antibiotics    Review of Systems   Pertinent items are noted in the HPI. Otherwise, ROS is negative.  Vitals:   Vitals:   12/13/17 1521  BP: 120/88  Pulse: 97  Temp: 97.7 F (36.5 C)  TempSrc: Oral  SpO2: 98%  Weight: 247 lb 9.6 oz (112.3 kg)     Body mass index is 42.5 kg/m.   Physical Exam:   Physical Exam  Constitutional: She appears well-nourished.  HENT:  Head: Normocephalic and atraumatic.  Eyes: EOM are normal. Pupils are equal, round, and reactive to light.  Neck: Normal range of motion. Neck supple.  Cardiovascular: Normal rate, regular rhythm, normal heart sounds and intact distal pulses.  Pulmonary/Chest: Effort normal.  Abdominal: Soft.  Musculoskeletal:       Lumbar back: She exhibits decreased range of motion, pain and spasm.  + SLR left side.  Skin: Skin is warm.  Psychiatric: She has a normal mood and affect. Her behavior is normal.  Nursing note and vitals reviewed.   Results for orders placed or performed in visit on 11/29/17  CBC with Differential/Platelet  Result Value Ref Range   WBC 11.6 (H) 4.0 - 10.5 K/uL   RBC 4.62 3.87 - 5.11 Mil/uL   Hemoglobin 12.4 12.0 - 15.0 g/dL   HCT 39.6 36.0 - 46.0 %   MCV 85.8 78.0 - 100.0 fl   MCHC 31.4 30.0 - 36.0 g/dL     RDW 16.4 (H) 11.5 - 15.5 %   Platelets 250.0 150.0 - 400.0 K/uL   Neutrophils Relative % 66.5 43.0 - 77.0 %   Lymphocytes Relative 26.5 12.0 - 46.0 %   Monocytes Relative 4.3 3.0 - 12.0 %   Eosinophils Relative 2.3 0.0 - 5.0 %   Basophils Relative 0.4 0.0 - 3.0 %   Neutro Abs 7.7  1.4 - 7.7 K/uL   Lymphs Abs 3.1 0.7 - 4.0 K/uL   Monocytes Absolute 0.5 0.1 - 1.0 K/uL   Eosinophils Absolute 0.3 0.0 - 0.7 K/uL   Basophils Absolute 0.0 0.0 - 0.1 K/uL  Comprehensive metabolic panel  Result Value Ref Range   Sodium 135 135 - 145 mEq/L   Potassium 4.4 3.5 - 5.1 mEq/L   Chloride 96 96 - 112 mEq/L   CO2 22 19 - 32 mEq/L   Glucose, Bld 289 (H) 70 - 99 mg/dL   BUN 11 6 - 23 mg/dL   Creatinine, Ser 0.60 0.40 - 1.20 mg/dL   Total Bilirubin 0.3 0.2 - 1.2 mg/dL   Alkaline Phosphatase 99 39 - 117 U/L   AST 27 0 - 37 U/L   ALT 30 0 - 35 U/L   Total Protein 8.0 6.0 - 8.3 g/dL   Albumin 4.7 3.5 - 5.2 g/dL   Calcium 9.9 8.4 - 10.5 mg/dL   GFR 113.91 >60.00 mL/min  Hemoglobin A1c  Result Value Ref Range   Hgb A1c MFr Bld 11.9 (H) 4.6 - 6.5 %  Pain Mgmt, Profile 8 w/Conf, U  Result Value Ref Range   Creatinine 115.8 > or = 20. mg/dL   pH 6.53 4.5 - 9.0   Oxidant NEGATIVE <200 mcg/mL   Amphetamines NEGATIVE <500 ng/mL   medMATCH Amphetamines CONSISTENT    Benzodiazepines POSITIVE (A) <100 ng/mL   Alphahydroxyalprazolam NEGATIVE <25 ng/mL   medMATCH aOH alprazolam CONSISTENT    Alphahydroxymidazolam NEGATIVE <50 ng/mL   medMATCH aOH midazolam CONSISTENT    Alphahydroxytriazolam NEGATIVE <50 ng/mL   medMATCH aOH triazolam CONSISTENT    Aminoclonazepam 74 (H) <25 ng/mL   medMATCH Aminoclonazepam INCONSISTENT    Hydroxyethylflurazepam NEGATIVE <50 ng/mL   medMATCH OH,Et flurazepam CONSISTENT    Lorazepam NEGATIVE <50 ng/mL   medMATCH Lorazepam CONSISTENT    Nordiazepam NEGATIVE <50 ng/mL   medMATCH Nordiazepam CONSISTENT    Oxazepam NEGATIVE <50 ng/mL   medMATCH Oxazepam CONSISTENT     Temazepam NEGATIVE <50 ng/mL   medMATCH Temazepam CONSISTENT    Marijuana Metabolite NEGATIVE <20 ng/mL   medMATCH Marijuana Metab CONSISTENT    Cocaine Metabolite NEGATIVE <150 ng/mL   medMATCH Cocaine Metab CONSISTENT    Opiates POSITIVE (A) <100 ng/mL   Codeine NEGATIVE <50 ng/mL   medMATCH Codeine CONSISTENT    Hydrocodone 114 (H) <50 ng/mL   medMATCH Hydrocodone INCONSISTENT    Hydromorphone NEGATIVE <50 ng/mL   medMATCH Hydromorphone CONSISTENT    Morphine NEGATIVE <50 ng/mL   medMATCH Morphine CONSISTENT    Norhydrocodone 205 (H) <50 ng/mL   medMATCH Norhydrocodone INCONSISTENT    Oxycodone NEGATIVE <100 ng/mL   medMATCH Oxycodone CONSISTENT    Buprenorphine, Urine NEGATIVE <5 ng/mL   medMATCH Buprenorphine CONSISTENT    MDMA NEGATIVE <500 ng/mL   medMATCH MDMA CONSISTENT    Alcohol Metabolites NEGATIVE <500 ng/mL   medMATCH Alcohol Metab CONSISTENT    6 Acetylmorphine NEGATIVE <10 ng/mL   medMATCH 6 Acetylmorphine CONSISTENT    Assessment and Plan:   Panzy was seen today for results.  Diagnoses and all orders for this visit:  Type 2 diabetes mellitus with complication, with long-term current use of insulin ([place]) Comments: UNCONTROLLED. Will trial GLP-1 receptor agonist. Clinical data have revealed that these therapies improve glycemic control while reducing body weight and systolic blood pressure in patients with type 2 diabetes. Furthermore, incidence of hypoglycemia is relatively low with these treatments (except when  used in combination with a sulfonylurea) because of their glucose-dependent mechanism of action.   Head-to-head clinical trial data suggest that long-acting GLP-1 receptor agonists produce superior glycemic control when compared with their short-acting counterparts. Furthermore, these long-acting GLP-1 receptor agonists were generally well tolerated, with transient nausea being the most frequently reported adverse effect. Orders: -     Dulaglutide  (TRULICITY) 1.69 CV/8.9FY SOPN; Inject 0.75 mg into the skin once a week. 0.75 mg [place] q wk x 2 wks , then increase to 1.5 mg [place] if tolerating  Sciatica of left side Comments: UNCONTROLLED. MRI pending. Pt warned that strong pain medication has side effects.  Watch for sedation and any alteration in mentation. Pt to stop medication if having inappropriate side effects. Also, all pain medications can have habit forming qualities: patient is aware of this information. Orders: -     HYDROcodone-acetaminophen (NORCO) 10-325 MG tablet; Take 1 tablet by mouth every 8 (eight) hours as needed.  Chronic left hip pain Comments: As above. Orders: -     HYDROcodone-acetaminophen (NORCO) 10-325 MG tablet; Take 1 tablet by mouth every 8 (eight) hours as needed.  Class 3 severe obesity due to excess calories with serious comorbidity and body mass index (BMI) of 40.0 to 44.9 in adult [facility]) Comments: The patient is asked to make an attempt to improve diet and exercise patterns to aid in medical management of this problem.  Orders: -     Dulaglutide (TRULICITY) 1.01 BP/1.0CH SOPN; Inject 0.75 mg into the skin once a week. 0.75 mg [place] q wk x 2 wks , then increase to 1.5 mg [place] if tolerating   . Reviewed expectations re: course of current medical issues. . Discussed self-management of symptoms. . Outlined signs and symptoms indicating need for more acute intervention. . Patient verbalized understanding and all questions were answered. Marland Kitchen Health Maintenance issues including appropriate healthy diet, exercise, and smoking avoidance were discussed with patient. . See orders for this visit as documented in the electronic medical record. . Patient received an After Visit Summary.  Briscoe Deutscher, DO [place] 12/14/2017  Future Appointments  Date Time Provider [place]  12/17/2017  6:50 PM GI-315 MR 1 GI-315MRI GI-[facility] [address] [facility]  01/10/2018  3:00 PM Briscoe Deutscher, DO [facility]  01/11/2018   4:30 PM Arfeen, Arlyce Harman, MD [facility] None  02/14/2018  4:30 PM Berenice Bouton, Jobe Marker [facility] None

## 2017-12-13 NOTE — Patient Instructions (Signed)
Please discuss adjusting your sleep medications with your Psychiatrist.   We are increasing your pain medication regimen.   We are going to try Ozempic.

## 2017-12-14 ENCOUNTER — Encounter: Payer: Self-pay | Admitting: Family Medicine

## 2017-12-15 ENCOUNTER — Other Ambulatory Visit: Payer: Self-pay | Admitting: Surgical

## 2017-12-15 ENCOUNTER — Encounter (INDEPENDENT_AMBULATORY_CARE_PROVIDER_SITE_OTHER): Payer: Self-pay | Admitting: Physical Medicine and Rehabilitation

## 2017-12-15 MED ORDER — DULAGLUTIDE 0.75 MG/0.5ML ~~LOC~~ SOAJ: SUBCUTANEOUS | 2 refills | Status: DC
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
## 2017-12-16 ENCOUNTER — Other Ambulatory Visit: Payer: Self-pay | Admitting: Surgical

## 2017-12-16 MED ORDER — ONDANSETRON HCL 4 MG PO TABS: 4.0000 mg | ORAL_TABLET | ORAL | 0 refills | Status: DC | PRN

## 2017-12-17 ENCOUNTER — Ambulatory Visit
Admission: RE | Admit: 2017-12-17 | Discharge: 2017-12-17 | Disposition: A | Discharge status: Home / Self Care | Payer: Medicare Other | Source: Ambulatory Visit | Attending: Family Medicine | Admitting: Family Medicine

## 2017-12-17 DIAGNOSIS — M5432 Sciatica, left side: Secondary | ICD-10-CM

## 2017-12-17 DIAGNOSIS — M48061 Spinal stenosis, lumbar region without neurogenic claudication: Secondary | ICD-10-CM | POA: Diagnosis not present

## 2017-12-30 ENCOUNTER — Other Ambulatory Visit (HOSPITAL_COMMUNITY): Payer: Self-pay | Admitting: Psychiatry

## 2017-12-30 ENCOUNTER — Other Ambulatory Visit: Payer: Self-pay | Admitting: Surgical

## 2017-12-30 DIAGNOSIS — F3131 Bipolar disorder, current episode depressed, mild: Secondary | ICD-10-CM

## 2017-12-30 DIAGNOSIS — Z794 Long term (current) use of insulin: Secondary | ICD-10-CM

## 2017-12-30 DIAGNOSIS — E118 Type 2 diabetes mellitus with unspecified complications: Secondary | ICD-10-CM

## 2017-12-30 MED ORDER — METFORMIN HCL 500 MG PO TABS: ORAL_TABLET | ORAL | 1 refills | Status: DC

## 2018-01-01 ENCOUNTER — Other Ambulatory Visit: Payer: Self-pay | Admitting: Family Medicine

## 2018-01-03 ENCOUNTER — Other Ambulatory Visit: Payer: Self-pay | Admitting: *Deleted

## 2018-01-03 ENCOUNTER — Encounter: Payer: Self-pay | Admitting: *Deleted

## 2018-01-03 ENCOUNTER — Telehealth: Payer: Self-pay | Admitting: Family Medicine

## 2018-01-03 MED ORDER — NAPROXEN 500 MG PO TABS: 250.0000 mg | ORAL_TABLET | Freq: Two times a day (BID) | ORAL | 3 refills | Status: DC

## 2018-01-03 NOTE — Telephone Encounter (Signed)
Spoke with Dr Earlene PlaterWallace regarding this dosage and schedule and reordered it per her request. I also sent patient a Mychart message explaining the change in dosage.

## 2018-01-03 NOTE — Telephone Encounter (Signed)
Copied from CRM [phone]. Topic: Quick Communication - See Telephone Encounter >> Jan 03, 2018  9:44 AM Everardo PacificM[place], Sisto Granillo, NT wrote: CRM for notification. See Telephone encounter for: [facility] called because they would like to know if the patient could have a 90 day supply of Naproxen. If someone could give them a call back at [phone]  01/03/18.

## 2018-01-03 NOTE — Telephone Encounter (Signed)
Pt requesting 90 day refill on Naproxen  LR 11/30/17 #60 0 refills OV 11/29/17

## 2018-01-03 NOTE — Telephone Encounter (Signed)
Please advise on the note below regarding a medication refill.  °

## 2018-01-05 ENCOUNTER — Encounter: Payer: Self-pay | Admitting: Family Medicine

## 2018-01-10 ENCOUNTER — Encounter: Payer: Self-pay | Admitting: Family Medicine

## 2018-01-10 ENCOUNTER — Ambulatory Visit (INDEPENDENT_AMBULATORY_CARE_PROVIDER_SITE_OTHER): Payer: Medicare Other | Admitting: Family Medicine

## 2018-01-10 DIAGNOSIS — Z6841 Body Mass Index (BMI) 40.0 and over, adult: Secondary | ICD-10-CM | POA: Diagnosis not present

## 2018-01-10 DIAGNOSIS — Z794 Long term (current) use of insulin: Secondary | ICD-10-CM

## 2018-01-10 DIAGNOSIS — M5432 Sciatica, left side: Secondary | ICD-10-CM | POA: Diagnosis not present

## 2018-01-10 DIAGNOSIS — E118 Type 2 diabetes mellitus with unspecified complications: Secondary | ICD-10-CM

## 2018-01-10 MED ORDER — DULAGLUTIDE 0.75 MG/0.5ML ~~LOC~~ SOAJ: 0.7500 mg | SUBCUTANEOUS | 4 refills | Status: DC

## 2018-01-10 MED ORDER — HYDROCODONE-ACETAMINOPHEN 10-325 MG PO TABS: 1.0000 | ORAL_TABLET | Freq: Three times a day (TID) | ORAL | 0 refills | Status: DC | PRN

## 2018-01-10 NOTE — Progress Notes (Signed)
Jodi Wolfe is a 47 y.o. female is here for follow up.  History of Present Illness:   HPI: See Assessment and Plan section for Problem Based Charting of issues discussed today.  Health Maintenance Due  Topic Date Due  . PNEUMOCOCCAL POLYSACCHARIDE VACCINE (1) 12/14/1972   Depression screen PHQ 2/9 04/19/2017  Decreased Interest 0  Down, Depressed, Hopeless 0  PHQ - 2 Score 0   PMHx, SurgHx, SocialHx, FamHx, Medications, and Allergies were reviewed in the Visit Navigator and updated as appropriate.   Patient Active Problem List   Diagnosis Date Noted  . Sciatica of left side 12/02/2017  . Class 3 severe obesity due to excess calories with serious comorbidity and body mass index (BMI) of 40.0 to 44.9 in adult ([place]) 04/19/2017  . GAD (generalized anxiety disorder) 04/19/2017  . Psychophysiological insomnia 04/19/2017  . Type 2 diabetes mellitus with complication, with long-term current use of insulin ([place]) 04/19/2017   Social History   Tobacco Use  . Smoking status: Never Smoker  . Smokeless tobacco: Never Used  Substance Use Topics  . Alcohol use: No  . Drug use: No   Current Medications and Allergies:   .  atorvastatin (LIPITOR) 10 MG tablet, Take 10 mg by mouth daily., Disp: , Rfl:  .  blood glucose meter kit and supplies KIT, Dispense based on patient and insurance preference. Use up to four times daily as directed. (FOR ICD-9 250.00, 250.01)., Disp: 1 each, Rfl: 0 .  chlorproMAZINE (THORAZINE) 50 MG tablet, Take 1 tablet (50 mg total) by mouth at bedtime., Disp: 90 tablet, Rfl: 0 .  clonazePAM (KLONOPIN) 0.5 MG tablet, Take 1 tablet (0.5 mg total) by mouth 2 (two) times daily as needed for anxiety., Disp: 60 tablet, Rfl: 2 .  CONTOUR NEXT TEST test strip, USE TO CHECK BLOOD SUGAR UP TO QID PRN, Disp: 100 each, Rfl: 5 .  Dulaglutide (TRULICITY) 9.16 BW/4.6KZ SOPN, Inject 0.75 mg into the skin once a week. 0.75 mg [place] q wk x 2 wks , then increase to 1.5 mg [place] if tolerating,  Disp: 1 pen, Rfl: 0 .  Dulaglutide (TRULICITY) 9.93 TT/0.1XB SOPN, 0.75 mg [place] q wk x 2 wks, Disp: 4 pen, Rfl: 2 .  esomeprazole (NEXIUM) 40 MG capsule, Take 1 capsule (40 mg total) by mouth daily., Disp: 90 capsule, Rfl: 1 .  gabapentin (NEURONTIN) 600 MG tablet, Take 1 tablet (600 mg total) by mouth 3 (three) times daily., Disp: 90 tablet, Rfl: 3 .  HYDROcodone-acetaminophen (NORCO) 10-325 MG tablet, Take 1 tablet by mouth every 8 (eight) hours as needed., Disp: 90 tablet, Rfl: 0 .  Insulin Detemir (LEVEMIR FLEXPEN) 100 UNIT/ML Pen, Inject 20 Units into the skin daily at 10 pm., Disp: 15 mL, Rfl: 11 .  Insulin Pen Needle (PEN NEEDLES) 31G X 5 MM MISC, 1 each by Does not apply route daily., Disp: 100 each, Rfl: 4 .  lamoTRIgine (LAMICTAL) 150 MG tablet, Take 1 tablet (150 mg total) by mouth daily., Disp: 90 tablet, Rfl: 0 .  metFORMIN (GLUCOPHAGE) 500 MG tablet, TAKE 3 TABLETS WITH BREAKFAST AND 2 TABLETS WITH DINNER., Disp: 450 tablet, Rfl: 1 .  MICROLET LANCETS MISC, USE TO CHECK BLOOD SUGAR UP TO QID PRN, Disp: 100 each, Rfl: 12 .  naproxen (NAPROSYN) 500 MG tablet, Take 0.5 tablets (250 mg total) by mouth 2 (two) times daily with a meal., Disp: 90 tablet, Rfl: 3 .  ondansetron (ZOFRAN) 4 MG tablet, TAKE 1 TABLET (  4 MG TOTAL) BY MOUTH AS NEEDED FOR NAUSEA OR VOMITING., Disp: 20 tablet, Rfl: 0  Allergies  Allergen Reactions  . Morphine And Related   . Sulfa Antibiotics    Review of Systems   Pertinent items are noted in the HPI. Otherwise, ROS is negative.  Vitals:   Vitals:   01/10/18 1458  BP: 126/88  Pulse: 97  Temp: 98.2 F (36.8 C)  TempSrc: Oral  SpO2: 99%  Weight: 239 lb 3.2 oz (108.5 kg)     Body mass index is 41.06 kg/m.   Physical Exam:   Physical Exam  Constitutional: She appears well-developed and well-nourished. No distress.  HENT:  Head: Normocephalic and atraumatic.  Eyes: EOM are normal. Pupils are equal, round, and reactive to light.  Neck: Normal range  of motion. Neck supple.  Cardiovascular: Normal rate, regular rhythm, normal heart sounds and intact distal pulses.  Pulmonary/Chest: Effort normal.  Abdominal: Soft.  Skin: Skin is warm.  Psychiatric: She has a normal mood and affect. Her behavior is normal.  Nursing note and vitals reviewed.   EXAM: MRI LUMBAR SPINE WITHOUT CONTRAST  TECHNIQUE: Multiplanar, multisequence MR imaging of the lumbar spine was performed. No intravenous contrast was administered.  COMPARISON:  None.  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 L5-S1 disc is rudimentary.  Alignment: 3 mm anterolisthesis of L4 on L5. Exaggeration of the normal lumbar lordosis. Trace dextroscoliosis.  Vertebrae: Vertebral body heights maintained without evidence for acute or chronic fracture. Bone marrow signal intensity within normal limits. No discrete or worrisome osseous lesions.  Conus medullaris and cauda equina: Conus extends to the T12-L1 level. Conus and cauda equina appear normal.  Paraspinal and other soft tissues: Paraspinous soft tissues demonstrate no acute abnormality. Scattered parapelvic cysts noted within left kidney. Visualized visceral structures otherwise unremarkable.  Disc levels:  L1-2: Normal interspace. Mild facet and ligamentum flavum hypertrophy. No stenosis.  L2-3: Normal interspace. Mild to moderate facet and ligament flavum hypertrophy. No stenosis.  L3-4: Minimal annular disc bulge with disc desiccation. Mild to moderate facet and ligament flavum hypertrophy. No stenosis.  L4-5: 3 mm anterolisthesis. Disc desiccation without significant disc bulge. Associated severe facet arthrosis. No significant canal or neural foraminal narrowing. No impingement.  L5-S1: Transitional lumbosacral anatomy with rudimentary L5-S1 disc. No stenosis or impingement.  IMPRESSION: 1. 3 mm anterolisthesis of L4 on L5 with  associated severe bilateral facet arthropathy. 2. Additional mild to moderate facet arthropathy at L1-2 thru L3-4. 3. No significant canal or neural foraminal stenosis within the lumbar spine. No impingement. 4. 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  Assessment and Plan:   1. Sciatica of left side Reviewed MRI results with patient. Will send to PMR. She has an appointment in 1-2 weeks. Will continue pain control as below for now. Benefits and risks reviewed.  - HYDROcodone-acetaminophen (NORCO) 10-325 MG tablet; Take 1 tablet by mouth every 8 (eight) hours as needed.  Dispense: 90 tablet; Refill: 0  2. Type 2 diabetes mellitus with complication, with long-term current use of insulin (HCC) No polyuria, polydipsia, blurry vision, chest pain, dyspnea or claudication. Taking medication compliantly with mild nausea that is improving.  - Dulaglutide (TRULICITY) 5.46 TK/3.5WS SOPN; Inject 0.75 mg into the skin once a week.  Dispense: 4 pen; Refill: 4  3. Class 3 severe obesity due to excess calories with serious comorbidity and body mass index (BMI) of 40.0 to 44.9 in adult [facility]) Doing  well with new medication. She stopped sodas. No exercise due to pain.  Wt Readings from Last 3 Encounters:  01/10/18 239 lb 3.2 oz (108.5 kg)  12/13/17 247 lb 9.6 oz (112.3 kg)  11/29/17 244 lb 6.4 oz (110.9 kg)   - Dulaglutide (TRULICITY) 0.93 GH/8.2XH SOPN; Inject 0.75 mg into the skin once a week.  Dispense: 4 pen; Refill: 4   . Reviewed expectations re: course of current medical issues. . Discussed self-management of symptoms. . Outlined signs and symptoms indicating need for more acute intervention. . Patient verbalized understanding and all questions were answered. Marland Kitchen Health Maintenance issues including appropriate healthy diet, exercise, and smoking avoidance were discussed with patient. . See orders for this visit as documented in the electronic medical  record. . Patient received an After Visit Summary.   Briscoe Deutscher, DO [place], [facility] 01/10/2018

## 2018-01-11 ENCOUNTER — Encounter (HOSPITAL_COMMUNITY): Payer: Self-pay | Admitting: Psychiatry

## 2018-01-11 ENCOUNTER — Ambulatory Visit (INDEPENDENT_AMBULATORY_CARE_PROVIDER_SITE_OTHER): Payer: Medicare Other | Admitting: Psychiatry

## 2018-01-11 DIAGNOSIS — E119 Type 2 diabetes mellitus without complications: Secondary | ICD-10-CM | POA: Diagnosis not present

## 2018-01-11 DIAGNOSIS — Z813 Family history of other psychoactive substance abuse and dependence: Secondary | ICD-10-CM | POA: Diagnosis not present

## 2018-01-11 DIAGNOSIS — Z818 Family history of other mental and behavioral disorders: Secondary | ICD-10-CM

## 2018-01-11 DIAGNOSIS — Z79899 Other long term (current) drug therapy: Secondary | ICD-10-CM | POA: Diagnosis not present

## 2018-01-11 DIAGNOSIS — Z915 Personal history of self-harm: Secondary | ICD-10-CM

## 2018-01-11 DIAGNOSIS — R52 Pain, unspecified: Secondary | ICD-10-CM

## 2018-01-11 DIAGNOSIS — F410 Panic disorder [episodic paroxysmal anxiety] without agoraphobia: Secondary | ICD-10-CM

## 2018-01-11 DIAGNOSIS — F4310 Post-traumatic stress disorder, unspecified: Secondary | ICD-10-CM | POA: Diagnosis not present

## 2018-01-11 DIAGNOSIS — F3131 Bipolar disorder, current episode depressed, mild: Secondary | ICD-10-CM

## 2018-01-11 MED ORDER — LAMOTRIGINE 150 MG PO TABS: 150.0000 mg | ORAL_TABLET | Freq: Every day | ORAL | 0 refills | Status: DC

## 2018-01-11 MED ORDER — CHLORPROMAZINE HCL 50 MG PO TABS: 50.0000 mg | ORAL_TABLET | Freq: Every day | ORAL | 0 refills | Status: DC

## 2018-01-11 MED ORDER — CLONAZEPAM 0.5 MG PO TABS: 0.5000 mg | ORAL_TABLET | Freq: Two times a day (BID) | ORAL | 2 refills | Status: DC | PRN
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
## 2018-01-17 ENCOUNTER — Encounter (INDEPENDENT_AMBULATORY_CARE_PROVIDER_SITE_OTHER): Payer: Self-pay | Admitting: Physical Medicine and Rehabilitation

## 2018-01-17 ENCOUNTER — Ambulatory Visit (INDEPENDENT_AMBULATORY_CARE_PROVIDER_SITE_OTHER): Payer: Medicare Other | Admitting: Physical Medicine and Rehabilitation

## 2018-01-17 VITALS — BP 125/72 | HR 78 | Temp 98.2°F

## 2018-01-17 DIAGNOSIS — M5442 Lumbago with sciatica, left side: Secondary | ICD-10-CM

## 2018-01-17 DIAGNOSIS — G8929 Other chronic pain: Secondary | ICD-10-CM | POA: Diagnosis not present

## 2018-01-17 DIAGNOSIS — Z6841 Body Mass Index (BMI) 40.0 and over, adult: Secondary | ICD-10-CM

## 2018-01-17 DIAGNOSIS — G894 Chronic pain syndrome: Secondary | ICD-10-CM | POA: Diagnosis not present

## 2018-01-17 DIAGNOSIS — M47816 Spondylosis without myelopathy or radiculopathy, lumbar region: Secondary | ICD-10-CM

## 2018-01-17 DIAGNOSIS — M4316 Spondylolisthesis, lumbar region: Secondary | ICD-10-CM | POA: Diagnosis not present

## 2018-01-17 DIAGNOSIS — F411 Generalized anxiety disorder: Secondary | ICD-10-CM | POA: Diagnosis not present

## 2018-01-17 DIAGNOSIS — E66813 Obesity, class 3: Secondary | ICD-10-CM

## 2018-01-17 NOTE — Progress Notes (Deleted)
      Pt states a constant pain in lower back on the left side that radiates to the left hip with some left groin pain.. Pt states pain has been at its worse for the past 6 months. Pt states sitting, standing, moving around makes pain worse, pain medication and cream for back eases pain.

## 2018-01-18 ENCOUNTER — Encounter (INDEPENDENT_AMBULATORY_CARE_PROVIDER_SITE_OTHER): Payer: Self-pay | Admitting: Physical Medicine and Rehabilitation
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
## 2018-01-29 ENCOUNTER — Other Ambulatory Visit: Payer: Self-pay | Admitting: Family Medicine

## 2018-01-31 ENCOUNTER — Encounter (INDEPENDENT_AMBULATORY_CARE_PROVIDER_SITE_OTHER): Payer: Medicare Other | Admitting: Physical Medicine and Rehabilitation

## 2018-01-31 NOTE — Telephone Encounter (Signed)
MEDICATION:   PHARMACY:    IS THIS A 90 DAY SUPPLY : no   IS PATIENT OUT OF MEDICATION:   IF NOT; HOW MUCH IS LEFT:   LAST APPOINTMENT DATE: @1 /14/2019  NEXT APPOINTMENT DATE:@4 /23/2019  OTHER COMMENTS: Patient last script for #20 given on 01/03/18   **Let patient know to contact pharmacy at the end of the day to make sure medication is ready. **  ** Please notify patient to allow 48-72 hours to process**  **Encourage patient to contact the pharmacy for refills or [facility]**

## 2018-02-14 ENCOUNTER — Ambulatory Visit (INDEPENDENT_AMBULATORY_CARE_PROVIDER_SITE_OTHER): Payer: Medicare Other | Admitting: Physical Medicine and Rehabilitation

## 2018-02-14 ENCOUNTER — Ambulatory Visit (INDEPENDENT_AMBULATORY_CARE_PROVIDER_SITE_OTHER): Payer: Self-pay

## 2018-02-14 ENCOUNTER — Encounter (INDEPENDENT_AMBULATORY_CARE_PROVIDER_SITE_OTHER): Payer: Self-pay | Admitting: Physical Medicine and Rehabilitation

## 2018-02-14 ENCOUNTER — Ambulatory Visit (HOSPITAL_COMMUNITY): Payer: Self-pay | Admitting: Licensed Clinical Social Worker

## 2018-02-14 VITALS — BP 120/83 | HR 87 | Temp 98.2°F

## 2018-02-14 DIAGNOSIS — M47816 Spondylosis without myelopathy or radiculopathy, lumbar region: Secondary | ICD-10-CM | POA: Diagnosis not present

## 2018-02-14 MED ORDER — METHYLPREDNISOLONE ACETATE 80 MG/ML IJ SUSP
80.0000 mg | Freq: Once | INTRAMUSCULAR | Status: AC
  Administered 2018-02-14: 80 mg

## 2018-02-14 NOTE — Patient Instructions (Signed)

## 2018-02-14 NOTE — Progress Notes (Deleted)
Pt states pain in lower back mostly on the left side and radiates to the left hip. Pt states pain has been there for months. Pt states normal activities makes pain worse, medication and laying down. +Driver, -BT, -Dye Allergies. vasovagal

## 2018-02-23 NOTE — Procedures (Signed)
[name] is a 47 year old female who comes in today for planned bilateral L4-5 facet joint blocks.  Please see our prior evaluation and management note for further details and justification.  Lumbar Facet Joint Intra-Articular Injection(s) with Fluoroscopic Guidance  Patient: Jodi Wolfe      Date of Birth: [birth year] MRN: [id number] PCP: Helane RimaWallace, Erica, DO      Visit Date: 02/14/2018   Universal Protocol:    Date/Time: 02/14/2018  Consent Given By: the patient  Position: PRONE   Additional Comments: Vital signs were monitored before and after the procedure. Patient was prepped and draped in the usual sterile fashion. The correct patient, procedure, and site was verified.   Injection Procedure Details:  Procedure Site One Meds Administered:  Meds ordered this encounter  Medications  . methylPREDNISolone acetate (DEPO-MEDROL) injection 80 mg     Laterality: Bilateral  Location/Site:  L4-L5  Needle size: 22 guage  Needle type: Spinal  Needle Placement: Articular  Findings:  -Comments: Excellent flow of contrast producing a partial arthrogram.  Procedure Details: The fluoroscope beam is vertically oriented in AP, and the inferior recess is visualized beneath the lower pole of the inferior apophyseal process, which represents the target point for needle insertion. When direct visualization is difficult the target point is located at the medial projection of the vertebral pedicle. The region overlying each aforementioned target is locally anesthetized with a 1 to 2 ml. volume of 1% Lidocaine without Epinephrine.   The spinal needle was inserted into each of the above mentioned facet joints using biplanar fluoroscopic guidance. A 0.25 to 0.5 ml. volume of Isovue-250 was injected and a partial facet joint arthrogram was obtained. A single spot film was obtained of the resulting arthrogram.    One to 1.25 ml of the steroid/anesthetic solution was then injected into each of the  facet joints noted above.   Additional Comments:  The patient tolerated the procedure well Dressing: Band-Aid    Post-procedure details: Patient was observed during the procedure. Post-procedure instructions were reviewed.  Patient left the clinic in stable condition.

## 2018-03-21 ENCOUNTER — Other Ambulatory Visit: Payer: Self-pay

## 2018-03-21 DIAGNOSIS — M5432 Sciatica, left side: Secondary | ICD-10-CM

## 2018-03-21 MED ORDER — GABAPENTIN 600 MG PO TABS: 600.0000 mg | ORAL_TABLET | Freq: Three times a day (TID) | ORAL | 0 refills | Status: DC

## 2018-03-22 IMAGING — MR MR LUMBAR SPINE W/O CM
5 of 6 series · 31 of 48 positions shown · non-contrast
Comparison: None.

CLINICAL DATA: Initial evaluation for worsening chronic lower back
pain extending into left lower extremity with numbness and tingling.

EXAM:
MRI LUMBAR SPINE WITHOUT CONTRAST
TECHNIQUE: Multiplanar, multisequence MR imaging of the lumbar spine was
performed. No intravenous contrast was administered.

[Series 2: T2 · sagittal · 4.0mm · 0.55mm/px · 4 of 14 slices shown (1 of 3)]
[im 1/14]
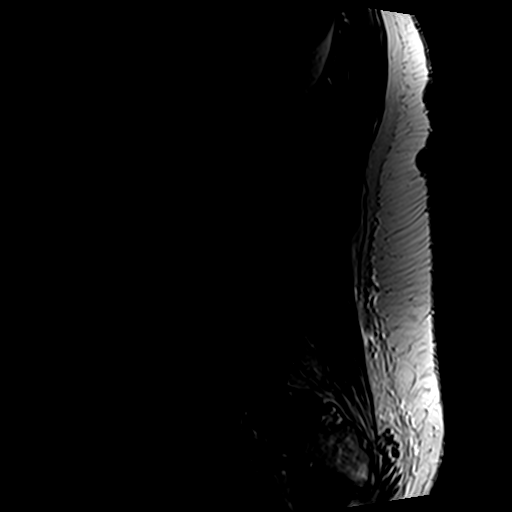
[im 5/14]
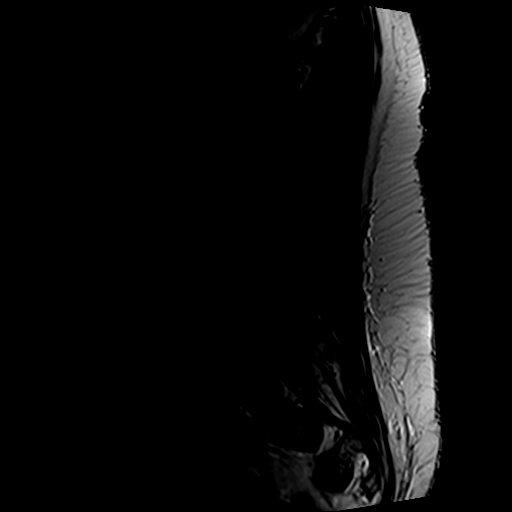
[im 9/14]
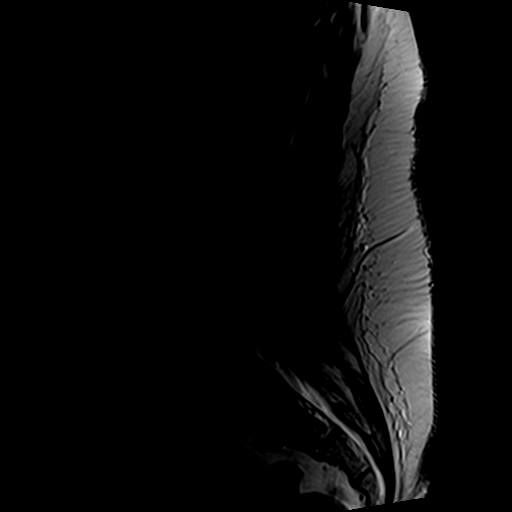
[im 14/14]
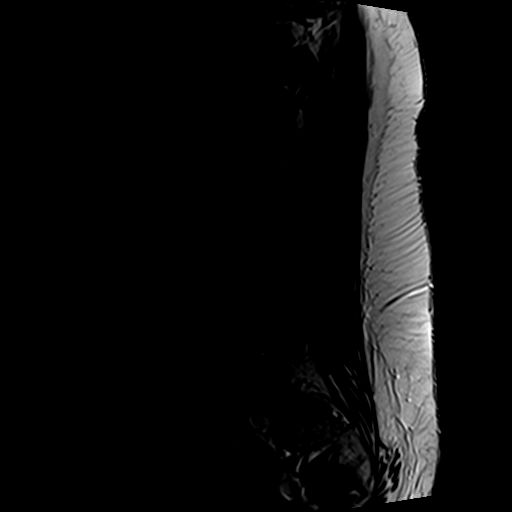

[Series 4: T1 · sagittal · 4.0mm · 0.55mm/px · 4 of 14 slices shown (1 of 2)]
[im 1/14]
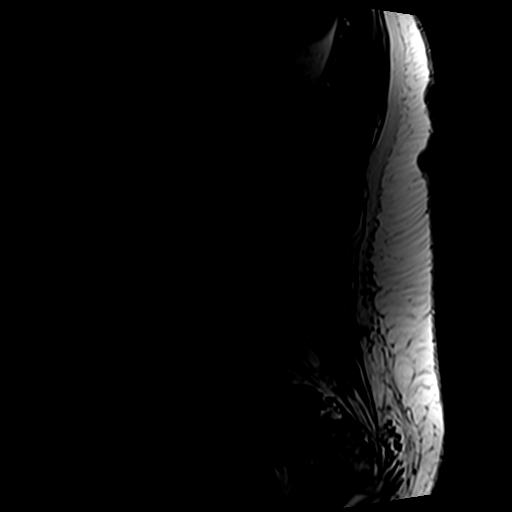
[im 5/14]
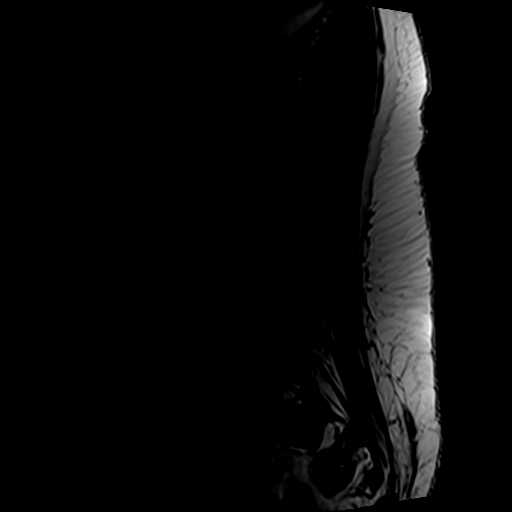
[im 9/14]
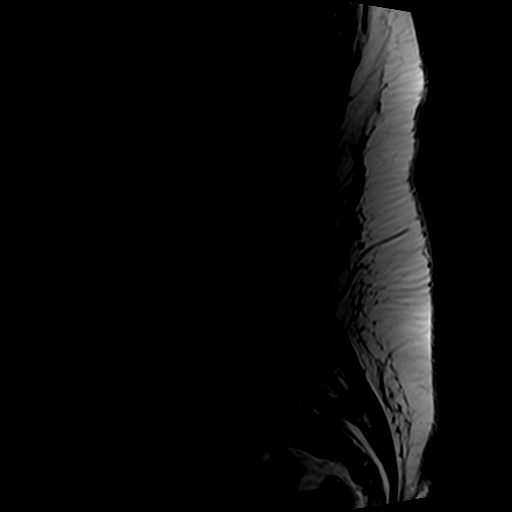
[im 14/14]
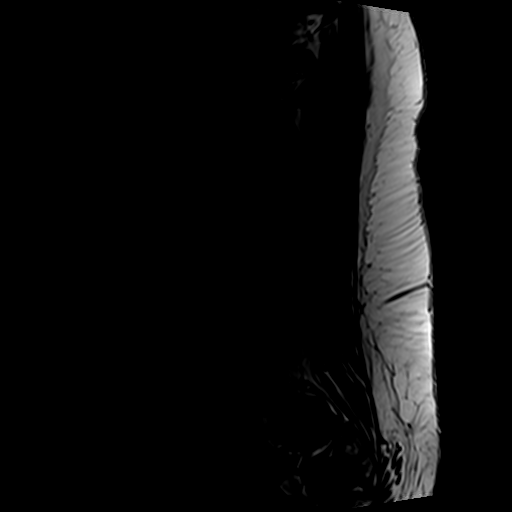

[Series 5: T2 · axial · 4.0mm · 0.78mm/px · z∈[-57,+188]mm · 9 of 43 slices shown (2 of 3)]
[im 1/43]
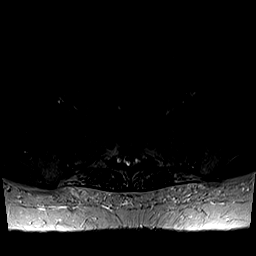
[im 8/43]
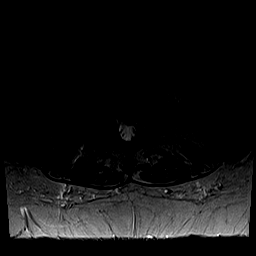
[im 12/43]
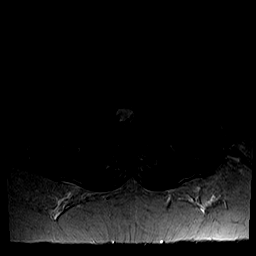
[im 20/43]
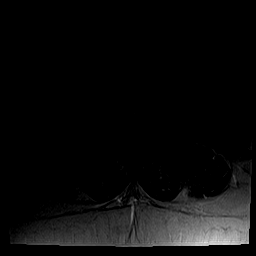
[im 23/43]
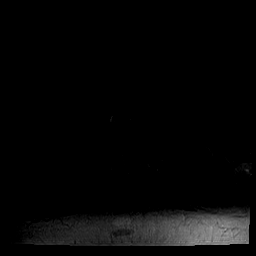
[im 31/43]
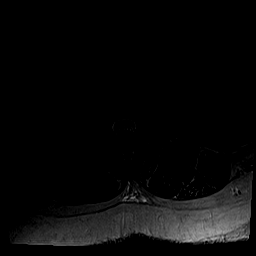
[im 35/43]
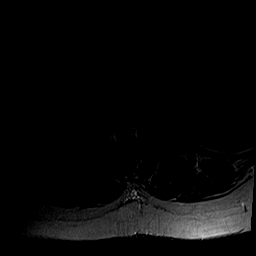
[im 39/43]
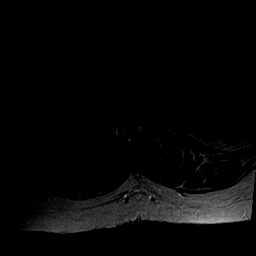
[im 43/43]
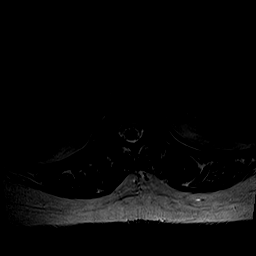

[Series 6: T1 · axial · 4.0mm · 0.39mm/px · z∈[-57,+78]mm · 5 of 43 slices shown (2 of 2)]
[im 1/43]
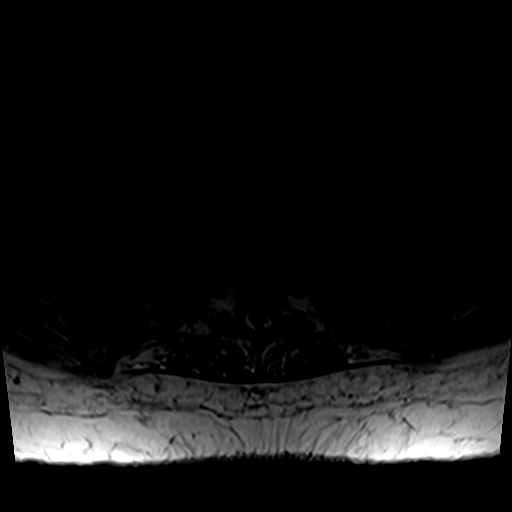
[im 8/43]
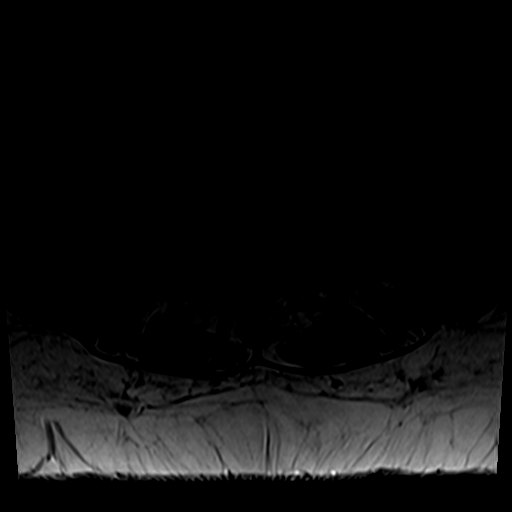
[im 12/43]
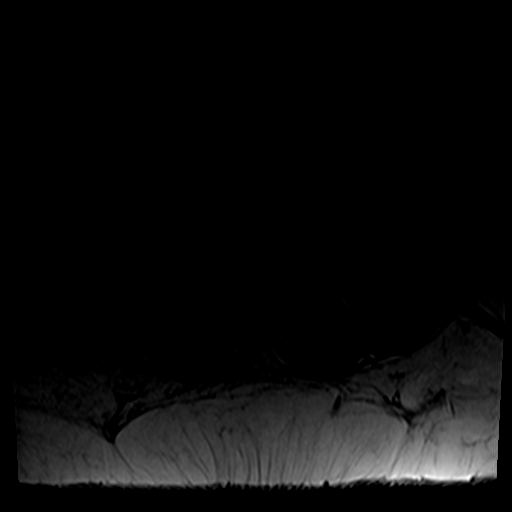
[im 20/43]
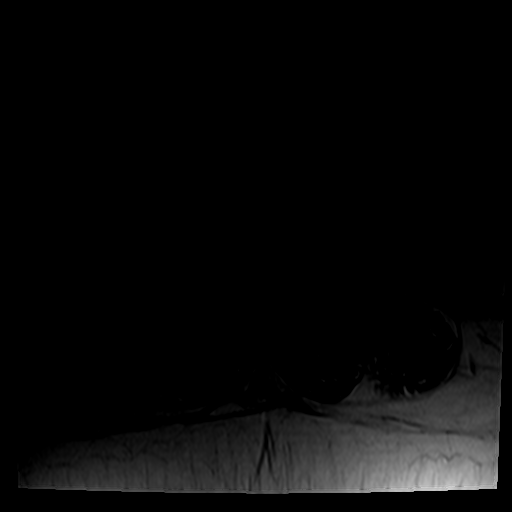
[im 23/43]
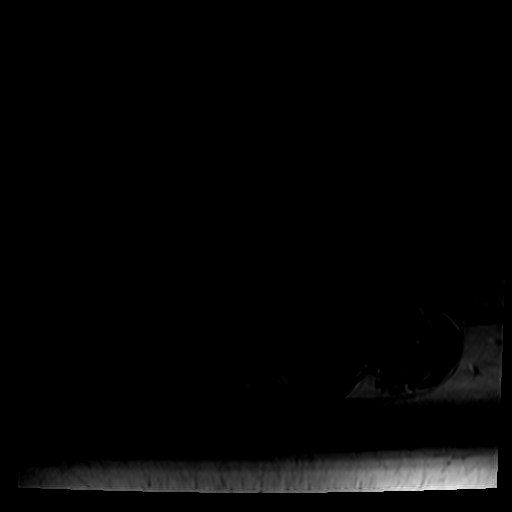

[Series 7: T2 · axial · 4.0mm · 0.78mm/px · z∈[-57,+188]mm · 9 of 43 slices shown (3 of 3)]
[im 1/43]
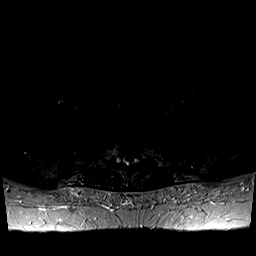
[im 8/43]
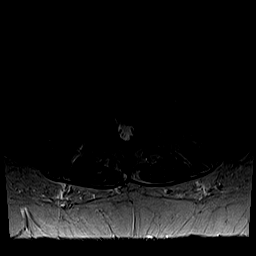
[im 12/43]
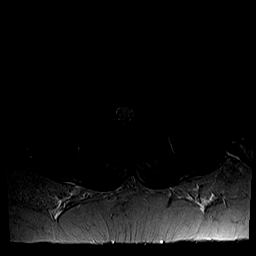
[im 20/43]
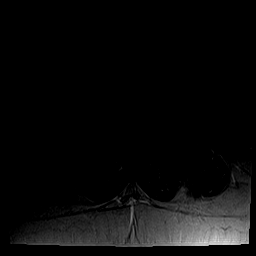
[im 23/43]
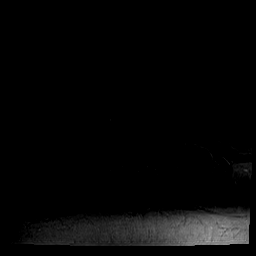
[im 31/43]
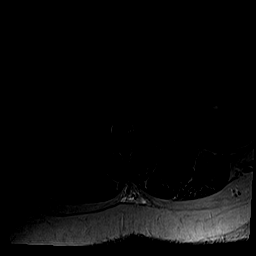
[im 35/43]
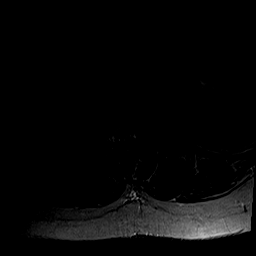
[im 39/43]
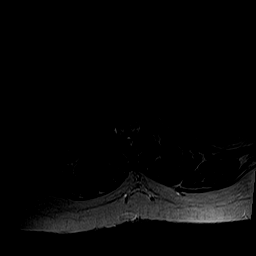
[im 43/43]
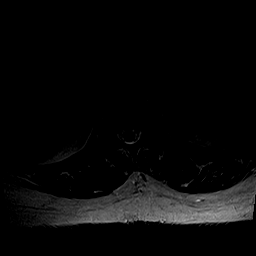

[31 of 48 positions shown; findings below may reference images not displayed]

FINDINGS: Segmentation: Transitional lumbosacral anatomy with sacralization of
the L5 vertebral body. L5-S1 disc is rudimentary.

Alignment: 3 mm anterolisthesis of L4 on L5. Exaggeration of the
normal lumbar lordosis. Trace dextroscoliosis.

Vertebrae: Vertebral body heights maintained without evidence for
acute or chronic fracture. Bone marrow signal intensity within
normal limits. No discrete or worrisome osseous lesions.

Conus medullaris and cauda equina: Conus extends to the T12-L1
level. Conus and cauda equina appear normal.

Paraspinal and other soft tissues: Paraspinous soft tissues
demonstrate no acute abnormality. Scattered parapelvic cysts noted
within left kidney. Visualized visceral structures otherwise
unremarkable.

Disc levels:

L1-2: Normal interspace. Mild facet and ligamentum flavum
hypertrophy. No stenosis.

L2-3: Normal interspace. Mild to moderate facet and ligament flavum
hypertrophy. No stenosis.

L3-4: Minimal annular disc bulge with disc desiccation. Mild to
moderate facet and ligament flavum hypertrophy. No stenosis.

L4-5: 3 mm anterolisthesis. Disc desiccation without significant
disc bulge. Associated severe facet arthrosis. No significant canal
or neural foraminal narrowing. No impingement.

L5-S1: Transitional lumbosacral anatomy with rudimentary L5-S1 disc.
No stenosis or impingement.
IMPRESSION: 1. 3 mm anterolisthesis of L4 on L5 with associated severe bilateral
facet arthropathy.
2. Additional mild to moderate facet arthropathy at L1-2 thru L3-4.
3. No significant canal or neural foraminal stenosis within the
lumbar spine. No impingement.
4. Transitional lumbosacral anatomy with sacralization of the L5
vertebral body.

## 2018-03-23 ENCOUNTER — Telehealth (INDEPENDENT_AMBULATORY_CARE_PROVIDER_SITE_OTHER): Payer: Self-pay | Admitting: Physical Medicine and Rehabilitation

## 2018-03-23 NOTE — Telephone Encounter (Signed)
Need to find out how much the facet joint blocks helped.  She might be a candidate for radiofrequency ablation.

## 2018-03-24 ENCOUNTER — Other Ambulatory Visit: Payer: Self-pay | Admitting: Family Medicine

## 2018-03-24 NOTE — Telephone Encounter (Signed)
Repeat is a double block and we can potentially look at radiofrequency ablation

## 2018-03-24 NOTE — Telephone Encounter (Signed)
Patient stated that she got 75% relief at first with the facet injections.

## 2018-03-25 NOTE — Telephone Encounter (Signed)
Left message for patient to call back to schedule.  °

## 2018-03-25 NOTE — Telephone Encounter (Signed)
Scheduled for 04/25/18 at 1515.

## 2018-04-02 ENCOUNTER — Other Ambulatory Visit: Payer: Self-pay | Admitting: Family Medicine

## 2018-04-02 DIAGNOSIS — K219 Gastro-esophageal reflux disease without esophagitis: Secondary | ICD-10-CM

## 2018-04-03 ENCOUNTER — Other Ambulatory Visit (HOSPITAL_COMMUNITY): Payer: Self-pay | Admitting: Psychiatry

## 2018-04-03 DIAGNOSIS — F3131 Bipolar disorder, current episode depressed, mild: Secondary | ICD-10-CM

## 2018-04-12 ENCOUNTER — Ambulatory Visit: Payer: Self-pay | Admitting: Family Medicine

## 2018-04-12 ENCOUNTER — Ambulatory Visit (INDEPENDENT_AMBULATORY_CARE_PROVIDER_SITE_OTHER): Payer: Medicare Other | Admitting: Psychiatry

## 2018-04-12 ENCOUNTER — Encounter (HOSPITAL_COMMUNITY): Payer: Self-pay | Admitting: Psychiatry

## 2018-04-12 DIAGNOSIS — F410 Panic disorder [episodic paroxysmal anxiety] without agoraphobia: Secondary | ICD-10-CM | POA: Diagnosis not present

## 2018-04-12 DIAGNOSIS — Z813 Family history of other psychoactive substance abuse and dependence: Secondary | ICD-10-CM | POA: Diagnosis not present

## 2018-04-12 DIAGNOSIS — F419 Anxiety disorder, unspecified: Secondary | ICD-10-CM | POA: Diagnosis not present

## 2018-04-12 DIAGNOSIS — G4700 Insomnia, unspecified: Secondary | ICD-10-CM | POA: Diagnosis not present

## 2018-04-12 DIAGNOSIS — M549 Dorsalgia, unspecified: Secondary | ICD-10-CM

## 2018-04-12 DIAGNOSIS — F3131 Bipolar disorder, current episode depressed, mild: Secondary | ICD-10-CM | POA: Diagnosis not present

## 2018-04-12 DIAGNOSIS — M2550 Pain in unspecified joint: Secondary | ICD-10-CM

## 2018-04-12 DIAGNOSIS — F4310 Post-traumatic stress disorder, unspecified: Secondary | ICD-10-CM

## 2018-04-12 DIAGNOSIS — E119 Type 2 diabetes mellitus without complications: Secondary | ICD-10-CM | POA: Diagnosis not present

## 2018-04-12 DIAGNOSIS — R450 Nervousness: Secondary | ICD-10-CM

## 2018-04-12 DIAGNOSIS — Z818 Family history of other mental and behavioral disorders: Secondary | ICD-10-CM | POA: Diagnosis not present

## 2018-04-12 MED ORDER — LAMOTRIGINE 200 MG PO TABS: 200.0000 mg | ORAL_TABLET | Freq: Every day | ORAL | 0 refills | Status: DC

## 2018-04-12 MED ORDER — CLONAZEPAM 0.5 MG PO TABS: 0.5000 mg | ORAL_TABLET | Freq: Three times a day (TID) | ORAL | 1 refills | Status: DC | PRN

## 2018-04-12 MED ORDER — CHLORPROMAZINE HCL 50 MG PO TABS: 50.0000 mg | ORAL_TABLET | Freq: Every day | ORAL | 0 refills | Status: DC
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
## 2018-04-18 NOTE — Progress Notes (Signed)
Jodi Wolfe is a 47 y.o. female is here for follow up.  History of Present Illness:   HPI:   1. Type 2 diabetes mellitus with complication.   Current symptoms: no polyuria or polydipsia, no chest pain, dyspnea or TIA's, has dysesthesias in the feet.   Lab Results  Component Value Date   HGBA1C 10.0 04/19/2018    Lab Results  Component Value Date   MICROALBUR 2.4 (H) 04/19/2018    Lab Results  Component Value Date   CHOL 235 (H) 04/19/2017   HDL 59.50 04/19/2017   LDLCALC 149 (H) 04/19/2017   TRIG 131.0 04/19/2017   CHOLHDL 4 04/19/2017     Wt Readings from Last 3 Encounters:  04/19/18 239 lb 9.6 oz (108.7 kg)  01/10/18 239 lb 3.2 oz (108.5 kg)  12/13/17 247 lb 9.6 oz (112.3 kg)   BP Readings from Last 3 Encounters:  04/19/18 125/72  02/14/18 120/83  01/17/18 125/72   Lab Results  Component Value Date   CREATININE 0.60 11/29/2017    2. Sciatica of left side. Epidural injection coming up. No new symptoms.    There are no preventive care reminders to display for this patient.   Depression screen PHQ 2/9 04/19/2017  Decreased Interest 0  Down, Depressed, Hopeless 0  PHQ - 2 Score 0   PMHx, SurgHx, SocialHx, FamHx, Medications, and Allergies were reviewed in the Visit Navigator and updated as appropriate.   Patient Active Problem List   Diagnosis Date Noted  . Sciatica of left side 12/02/2017  . Class 3 severe obesity due to excess calories with serious comorbidity and body mass index (BMI) of 40.0 to 44.9 in adult ([facility]) 04/19/2017  . GAD (generalized anxiety disorder) 04/19/2017  . Psychophysiological insomnia 04/19/2017  . Type 2 diabetes mellitus with complication, with long-term current use of insulin ([facility]) 04/19/2017   Social History   Tobacco Use  . Smoking status: Never Smoker  . Smokeless tobacco: Never Used  Substance Use Topics  . Alcohol use: No  . Drug use: No   Current Medications and Allergies:   .  atorvastatin (LIPITOR) 10 MG tablet,  Take 10 mg by mouth daily., Disp: , Rfl:  .  blood glucose meter kit and supplies KIT, Dispense based on patient and insurance preference. Use up to four times daily as directed. (FOR ICD-9 250.00, 250.01)., Disp: 1 each, Rfl: 0 .  chlorproMAZINE (THORAZINE) 50 MG tablet, Take 1 tablet (50 mg total) by mouth at bedtime., Disp: 90 tablet, Rfl: 0 .  clonazePAM (KLONOPIN) 0.5 MG tablet, Take 1 tablet (0.5 mg total) by mouth 3 (three) times daily as needed for anxiety., Disp: 90 tablet, Rfl: 1 .  CONTOUR NEXT TEST test strip, USE TO CHECK BLOOD SUGAR UP TO QID PRN, Disp: 100 each, Rfl: 5 .  Dulaglutide (TRULICITY) 0.62 BJ/6.2GB SOPN, Inject 0.75 mg into the skin once a week., Disp: 4 pen, Rfl: 4 .  esomeprazole (NEXIUM) 40 MG capsule, TAKE 1 CAPSULE BY MOUTH EVERY DAY, Disp: 90 capsule, Rfl: 1 .  gabapentin (NEURONTIN) 600 MG tablet, Take 1 tablet (600 mg total) by mouth 3 (three) times daily., Disp: 270 tablet, Rfl: 0 .  Insulin Detemir (LEVEMIR FLEXPEN) 100 UNIT/ML Pen, Inject 20 Units into the skin daily at 10 pm., Disp: 15 mL, Rfl: 11 .  Insulin Pen Needle (PEN NEEDLES) 31G X 5 MM MISC, 1 each by Does not apply route daily., Disp: 100 each, Rfl: 4 .  lamoTRIgine (LAMICTAL)  200 MG tablet, Take 1 tablet (200 mg total) by mouth daily., Disp: 90 tablet, Rfl: 0 .  MICROLET LANCETS MISC, USE TO CHECK BLOOD SUGAR UP TO QID PRN, Disp: 100 each, Rfl: 12 .  naproxen (NAPROSYN) 500 MG tablet, Take 0.5 tablets (250 mg total) by mouth 2 (two) times daily with a meal., Disp: 90 tablet, Rfl: 3 .  ondansetron (ZOFRAN) 4 MG tablet, TAKE 1 TABLET (4 MG TOTAL) BY MOUTH AS NEEDED FOR NAUSEA OR VOMITING., Disp: 20 tablet, Rfl: 0   Allergies  Allergen Reactions  . Morphine And Related   . Sulfa Antibiotics    Review of Systems   Pertinent items are noted in the HPI. Otherwise, ROS is negative.  Vitals:   Vitals:   04/19/18 1549  BP: 125/72  Pulse: 85  Temp: 98.6 F (37 C)  TempSrc: Oral  SpO2: 95%    Weight: 239 lb 9.6 oz (108.7 kg)  Height: '5\' 4"'$  (1.626 m)     Body mass index is 41.13 kg/m.   Physical Exam:   Physical Exam  Constitutional: She appears well-developed and well-nourished. No distress.  HENT:  Head: Normocephalic and atraumatic.  Eyes: Pupils are equal, round, and reactive to light. EOM are normal.  Neck: Normal range of motion. Neck supple.  Cardiovascular: Normal rate, regular rhythm, normal heart sounds and intact distal pulses.  Pulmonary/Chest: Effort normal.  Abdominal: Soft.  Skin: Skin is warm.  Psychiatric: She has a normal mood and affect. Her behavior is normal.  Nursing note and vitals reviewed.   Assessment and Plan:   Jodi Wolfe was seen today for follow-up.  Diagnoses and all orders for this visit:  Type 2 diabetes mellitus with complication, with long-term current use of insulin (HCC) -     Microalbumin / creatinine urine ratio -     POCT HgB A1C -     Semaglutide (OZEMPIC) 0.25 or 0.5 MG/DOSE SOPN; Inject 1 pen into the skin once for 1 dose. -     Semaglutide (OZEMPIC) 1 MG/DOSE SOPN; Inject 1 mg into the skin once a week. -     ondansetron (ZOFRAN ODT) 4 MG disintegrating tablet; Take 1 tablet (4 mg total) by mouth every 8 (eight) hours as needed for nausea or vomiting.  Need for pneumococcal vaccination -     Pneumococcal polysaccharide vaccine 23-valent greater than or equal to 2yo subcutaneous/IM  Sciatica of left side -     HYDROcodone-acetaminophen (NORCO) 10-325 MG tablet; Take 1 tablet by mouth every 8 (eight) hours as needed. -     HYDROcodone-acetaminophen (NORCO) 10-325 MG tablet; Take 1 tablet by mouth every 8 (eight) hours as needed. -     HYDROcodone-acetaminophen (NORCO) 10-325 MG tablet; Take 1 tablet by mouth every 8 (eight) hours as needed.   . Reviewed expectations re: course of current medical issues. . Discussed self-management of symptoms. . Outlined signs and symptoms indicating need for more acute  intervention. . Patient verbalized understanding and all questions were answered. Marland Kitchen Health Maintenance issues including appropriate healthy diet, exercise, and smoking avoidance were discussed with patient. . See orders for this visit as documented in the electronic medical record. . Patient received an After Visit Summary.  Briscoe Deutscher, DO [place], [facility] 04/26/2018  Future Appointments  Date Time Provider [place]  05/09/2018  2:30 PM Magnus Sinning, MD [facility] None  05/30/2018  4:30 PM Carlus Pavlov R [facility] None  06/06/2018  2:30 PM Arfeen, Arlyce Harman, MD [facility]  None  07/26/2018  3:00 PM Briscoe Deutscher, DO [facility]

## 2018-04-19 ENCOUNTER — Ambulatory Visit (INDEPENDENT_AMBULATORY_CARE_PROVIDER_SITE_OTHER): Payer: Medicare Other | Admitting: Family Medicine

## 2018-04-19 ENCOUNTER — Encounter: Payer: Self-pay | Admitting: Family Medicine

## 2018-04-19 VITALS — BP 125/72 | HR 85 | Temp 98.6°F | Ht 64.0 in | Wt 239.6 lb

## 2018-04-19 DIAGNOSIS — Z23 Encounter for immunization: Secondary | ICD-10-CM | POA: Diagnosis not present

## 2018-04-19 DIAGNOSIS — M5432 Sciatica, left side: Secondary | ICD-10-CM | POA: Diagnosis not present

## 2018-04-19 DIAGNOSIS — E118 Type 2 diabetes mellitus with unspecified complications: Secondary | ICD-10-CM

## 2018-04-19 DIAGNOSIS — Z794 Long term (current) use of insulin: Secondary | ICD-10-CM | POA: Diagnosis not present

## 2018-04-19 LAB — POCT GLYCOSYLATED HEMOGLOBIN (HGB A1C): Hemoglobin A1C: 10

## 2018-04-19 MED ORDER — HYDROCODONE-ACETAMINOPHEN 10-325 MG PO TABS
1.0000 | ORAL_TABLET | Freq: Three times a day (TID) | ORAL | 0 refills | Status: DC | PRN
Start: 2018-06-19 — End: 2018-07-18

## 2018-04-19 MED ORDER — HYDROCODONE-ACETAMINOPHEN 10-325 MG PO TABS
1.0000 | ORAL_TABLET | Freq: Three times a day (TID) | ORAL | 0 refills | Status: DC | PRN
Start: 2018-05-19 — End: 2018-07-18

## 2018-04-19 MED ORDER — SEMAGLUTIDE (1 MG/DOSE) 2 MG/1.5ML ~~LOC~~ SOPN: 1.0000 mg | PEN_INJECTOR | SUBCUTANEOUS | 6 refills | Status: DC

## 2018-04-19 MED ORDER — ONDANSETRON 4 MG PO TBDP: 4.0000 mg | ORAL_TABLET | Freq: Three times a day (TID) | ORAL | 0 refills | Status: DC | PRN

## 2018-04-19 MED ORDER — HYDROCODONE-ACETAMINOPHEN 10-325 MG PO TABS
1.0000 | ORAL_TABLET | Freq: Three times a day (TID) | ORAL | 0 refills | Status: DC | PRN
Start: 2018-04-19 — End: 2018-07-18

## 2018-04-19 MED ORDER — SEMAGLUTIDE(0.25 OR 0.5MG/DOS) 2 MG/1.5ML ~~LOC~~ SOPN: 1.0000 "pen " | PEN_INJECTOR | Freq: Once | SUBCUTANEOUS | 0 refills | Status: AC

## 2018-04-19 NOTE — Patient Instructions (Signed)
.  25mg  once a week for 4 weeks then 0.5mg  once a week for two weeks

## 2018-04-20 LAB — MICROALBUMIN / CREATININE URINE RATIO
Creatinine,U: 51.2 mg/dL
Microalb Creat Ratio: 4.7 mg/g (ref 0.0–30.0)
Microalb, Ur: 2.4 mg/dL — ABNORMAL HIGH (ref 0.0–1.9)

## 2018-04-25 ENCOUNTER — Encounter (INDEPENDENT_AMBULATORY_CARE_PROVIDER_SITE_OTHER): Payer: Medicare Other | Admitting: Physical Medicine and Rehabilitation

## 2018-04-26 ENCOUNTER — Encounter: Payer: Self-pay | Admitting: Family Medicine

## 2018-05-07 ENCOUNTER — Other Ambulatory Visit: Payer: Self-pay | Admitting: Family Medicine

## 2018-05-07 DIAGNOSIS — Z794 Long term (current) use of insulin: Secondary | ICD-10-CM

## 2018-05-07 DIAGNOSIS — E118 Type 2 diabetes mellitus with unspecified complications: Secondary | ICD-10-CM

## 2018-05-08 ENCOUNTER — Other Ambulatory Visit: Payer: Self-pay | Admitting: Family Medicine

## 2018-05-08 DIAGNOSIS — M549 Dorsalgia, unspecified: Secondary | ICD-10-CM

## 2018-05-08 DIAGNOSIS — G44219 Episodic tension-type headache, not intractable: Secondary | ICD-10-CM

## 2018-05-08 DIAGNOSIS — M25551 Pain in right hip: Secondary | ICD-10-CM

## 2018-05-08 DIAGNOSIS — M5489 Other dorsalgia: Secondary | ICD-10-CM

## 2018-05-08 DIAGNOSIS — E118 Type 2 diabetes mellitus with unspecified complications: Secondary | ICD-10-CM

## 2018-05-08 DIAGNOSIS — G8929 Other chronic pain: Secondary | ICD-10-CM

## 2018-05-08 DIAGNOSIS — Z794 Long term (current) use of insulin: Secondary | ICD-10-CM

## 2018-05-09 ENCOUNTER — Encounter (INDEPENDENT_AMBULATORY_CARE_PROVIDER_SITE_OTHER): Payer: Medicare Other | Admitting: Physical Medicine and Rehabilitation

## 2018-05-09 NOTE — Telephone Encounter (Signed)
Please advise on refill.

## 2018-05-11 ENCOUNTER — Other Ambulatory Visit: Payer: Self-pay | Admitting: *Deleted

## 2018-05-11 ENCOUNTER — Telehealth: Payer: Self-pay | Admitting: Family Medicine

## 2018-05-11 ENCOUNTER — Encounter (INDEPENDENT_AMBULATORY_CARE_PROVIDER_SITE_OTHER): Payer: Self-pay | Admitting: Physical Medicine and Rehabilitation

## 2018-05-11 NOTE — Telephone Encounter (Signed)
Rx has been pended at office for provider review 

## 2018-05-11 NOTE — Telephone Encounter (Signed)
Copied from CRM [id number]. Topic: Quick Communication - See Telephone Encounter >> May 11, 2018 10:54 AM Trula Slade wrote: CRM for notification. See Telephone encounter for: 05/11/18. [facility] w[facility] on [address] in [place] [phone] would like a refill on the patient's Insulin Detemir (LEVEMIR FLEXPEN) 100 UNIT/ML Pen medication.

## 2018-05-11 NOTE — Telephone Encounter (Signed)
PCP Helane Rima

## 2018-05-11 NOTE — Telephone Encounter (Signed)
Okay refill. 

## 2018-05-11 NOTE — Telephone Encounter (Signed)
Please advise on refill.

## 2018-05-19 ENCOUNTER — Encounter: Payer: Self-pay | Admitting: Family Medicine

## 2018-05-20 NOTE — Telephone Encounter (Signed)
Copied from CRM [id number]. Topic: Quick Communication - Rx Refill/Question >> May 20, 2018  5:37 PM Jonette Eva wrote: Medication: HYDROcodone-acetaminophen (NORCO) 10-325 MG tablet [[id number]]  Has the patient contacted their pharmacy? Yes.   (Agent: If no, request that the patient contact the pharmacy for the refill.) (Agent: If yes, when and what did the pharmacy advise?)  [facility] ([facility] phone [facility]  Agent: Please be advised that RX refills may take up to 3 business days. We ask that you follow-up with your pharmacy.

## 2018-05-30 ENCOUNTER — Ambulatory Visit (INDEPENDENT_AMBULATORY_CARE_PROVIDER_SITE_OTHER): Payer: Self-pay

## 2018-05-30 ENCOUNTER — Ambulatory Visit (HOSPITAL_COMMUNITY): Payer: Self-pay | Admitting: Licensed Clinical Social Worker

## 2018-05-30 ENCOUNTER — Encounter: Payer: Self-pay | Admitting: Family Medicine

## 2018-05-30 ENCOUNTER — Ambulatory Visit (INDEPENDENT_AMBULATORY_CARE_PROVIDER_SITE_OTHER): Payer: Medicare Other | Admitting: Physical Medicine and Rehabilitation

## 2018-05-30 ENCOUNTER — Encounter (INDEPENDENT_AMBULATORY_CARE_PROVIDER_SITE_OTHER): Payer: Self-pay | Admitting: Physical Medicine and Rehabilitation

## 2018-05-30 VITALS — BP 136/83 | HR 68

## 2018-05-30 DIAGNOSIS — G8929 Other chronic pain: Secondary | ICD-10-CM

## 2018-05-30 DIAGNOSIS — M47816 Spondylosis without myelopathy or radiculopathy, lumbar region: Secondary | ICD-10-CM | POA: Diagnosis not present

## 2018-05-30 DIAGNOSIS — M545 Low back pain: Secondary | ICD-10-CM | POA: Diagnosis not present

## 2018-05-30 MED ORDER — METHYLPREDNISOLONE ACETATE 80 MG/ML IJ SUSP
80.0000 mg | Freq: Once | INTRAMUSCULAR | Status: AC
  Administered 2018-05-30: 80 mg

## 2018-05-30 NOTE — Patient Instructions (Signed)

## 2018-05-30 NOTE — Progress Notes (Signed)
 .  Numeric Pain Rating Scale and Functional Assessment Average Pain 8   In the last MONTH (on 0-10 scale) has pain interfered with the following?  1. General activity like being  able to carry out your everyday physical activities such as walking, climbing stairs, carrying groceries, or moving a chair?  Rating(6)   +Driver, -BT, -Dye Allergies.  

## 2018-05-31 NOTE — Procedures (Signed)
Lumbar Diagnostic Facet Joint Nerve Block with Fluoroscopic Guidance   Patient: Jodi Wolfe      Date of Birth: Mar 07, 1971 MRN: [id number] PCP: Helane RimaWallace, Erica, DO      Visit Date: 05/30/2018   Universal Protocol:    Date/Time: 06/04/196:04 AM  Consent Given By: the patient  Position: PRONE  Additional Comments: Vital signs were monitored before and after the procedure. Patient was prepped and draped in the usual sterile fashion. The correct patient, procedure, and site was verified.   Injection Procedure Details:  Procedure Site One Meds Administered:  Meds ordered this encounter  Medications  . methylPREDNISolone acetate (DEPO-MEDROL) injection 80 mg     Laterality: Bilateral  Location/Site:  L4-L5  Needle size: 22 ga.  Needle type:spinal  Needle Placement: Oblique pedical  Findings:   -Comments: There was excellent flow of contrast along the articular pillars without intravascular flow.  Procedure Details: The fluoroscope beam is vertically oriented in AP and then obliqued 15 to 20 degrees to the ipsilateral side of the desired nerve to achieve the "Scotty dog" appearance.  The skin over the target area of the junction of the superior articulating process and the transverse process (sacral ala if blocking the L5 dorsal rami) was locally anesthetized with a 1 ml volume of 1% Lidocaine without Epinephrine.  The spinal needle was inserted and advanced in a trajectory view down to the target.   After contact with periosteum and negative aspirate for blood and CSF, correct placement without intravascular or epidural spread was confirmed by injecting 0.5 ml. of Isovue-250.  A spot radiograph was obtained of this image.    Next, a 0.5 ml. volume of the injectate described above was injected. The needle was then redirected to the other facet joint nerves mentioned above if needed.  Prior to the procedure, the patient was given a Pain Diary which was completed for baseline  measurements.  After the procedure, the patient rated their pain every 30 minutes and will continue rating at this frequency for a total of 5 hours.  The patient has been asked to complete the Diary and return to us by mail, fax or hand delivered as soon as possible.   Additional Comments:  The patient tolerated the procedure well Dressing: Band-Aid    Post-procedure details: Patient was observed during the procedure. Post-procedure instructions were reviewed.  Patient left the clinic in stable condition.

## 2018-05-31 NOTE — Progress Notes (Signed)
Jodi Wolfe - 47 y.o. female MRN [id number]  Date of birth: Feb 23, 1971  Office Visit Note: Visit Date: 05/30/2018 PCP: Helane Rima, DO Referred by: Helane Rima, DO  Subjective: Chief Complaint  Patient presents with  . Lower Back - Pain   HPI: Mrs. Jodi Wolfe is a 47 year old female with chronic history of low back pain and chronic pain syndrome with complicated psychological history and diabetes as well as morbid obesity.  She did well with prior diagnostic medial branch/facet joint blocks with 60% relief.  We are going to complete another injection today at the same level which is L4-5.  If she gets good relief again we could potentially look at radiofrequency ablation although this may be hard to do Duda body habitus and size of the cannulas.  She should continue to work on weight loss and core strengthening.  She will continue to follow-up with her primary care physician.  She is taking 10 mg of hydrocodone up to 3 times per day.  She also takes gabapentin.   ROS Otherwise per HPI.  Assessment & Plan: Visit Diagnoses:  1. Spondylosis without myelopathy or radiculopathy, lumbar region   2. Chronic left-sided low back pain without sciatica     Plan: No additional findings.   Meds & Orders:  Meds ordered this encounter  Medications  . methylPREDNISolone acetate (DEPO-MEDROL) injection 80 mg    Orders Placed This Encounter  Procedures  . Facet Injection  . XR C-ARM NO REPORT    Follow-up: Return if symptoms worsen or fail to improve.   Procedures: No procedures performed  Lumbar Diagnostic Facet Joint Nerve Block with Fluoroscopic Guidance   Patient: Jodi Wolfe      Date of Birth: Apr 24, 1971 MRN: [id number] PCP: Helane Rima, DO      Visit Date: 05/30/2018   Universal Protocol:    Date/Time: 06/04/196:04 AM  Consent Given By: the patient  Position: PRONE  Additional Comments: Vital signs were monitored before and after the procedure. Patient was prepped and  draped in the usual sterile fashion. The correct patient, procedure, and site was verified.   Injection Procedure Details:  Procedure Site One Meds Administered:  Meds ordered this encounter  Medications  . methylPREDNISolone acetate (DEPO-MEDROL) injection 80 mg     Laterality: Bilateral  Location/Site:  L4-L5  Needle size: 22 ga.  Needle type:spinal  Needle Placement: Oblique pedical  Findings:   -Comments: There was excellent flow of contrast along the articular pillars without intravascular flow.  Procedure Details: The fluoroscope beam is vertically oriented in AP and then obliqued 15 to 20 degrees to the ipsilateral side of the desired nerve to achieve the "Scotty dog" appearance.  The skin over the target area of the junction of the superior articulating process and the transverse process (sacral ala if blocking the L5 dorsal rami) was locally anesthetized with a 1 ml volume of 1% Lidocaine without Epinephrine.  The spinal needle was inserted and advanced in a trajectory view down to the target.   After contact with periosteum and negative aspirate for blood and CSF, correct placement without intravascular or epidural spread was confirmed by injecting 0.5 ml. of Isovue-250.  A spot radiograph was obtained of this image.    Next, a 0.5 ml. volume of the injectate described above was injected. The needle was then redirected to the other facet joint nerves mentioned above if needed.  Prior to the procedure, the patient was given a Pain Diary which was completed for baseline measurements.  After the procedure, the patient rated their pain every 30 minutes and will continue rating at this frequency for a total of 5 hours.  The patient has been asked to complete the Diary and return to [place] by mail, fax or hand delivered as soon as possible.   Additional Comments:  The patient tolerated the procedure well Dressing: Band-Aid    Post-procedure details: Patient was observed  during the procedure. Post-procedure instructions were reviewed.  Patient left the clinic in stable condition.   Clinical History: MRI LUMBAR SPINE WITHOUT CONTRAST  TECHNIQUE: Multiplanar, multisequence MR imaging of the lumbar spine was performed. No intravenous contrast was administered.  COMPARISON:  None.  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 L5-S1 disc is rudimentary.  Alignment: 3 mm anterolisthesis of L4 on L5. Exaggeration of the normal lumbar lordosis. Trace dextroscoliosis.  Vertebrae: Vertebral body heights maintained without evidence for acute or chronic fracture. Bone marrow signal intensity within normal limits. No discrete or worrisome osseous lesions.  Conus medullaris and cauda equina: Conus extends to the T12-L1 level. Conus and cauda equina appear normal.  Paraspinal and other soft tissues: Paraspinous soft tissues demonstrate no acute abnormality. Scattered parapelvic cysts noted within left kidney. Visualized visceral structures otherwise unremarkable.  Disc levels:  L1-2: Normal interspace. Mild facet and ligamentum flavum hypertrophy. No stenosis.  L2-3: Normal interspace. Mild to moderate facet and ligament flavum hypertrophy. No stenosis.  L3-4: Minimal annular disc bulge with disc desiccation. Mild to moderate facet and ligament flavum hypertrophy. No stenosis.  L4-5: 3 mm anterolisthesis. Disc desiccation without significant disc bulge. Associated severe facet arthrosis. No significant canal or neural foraminal narrowing. No impingement.  L5-S1: Transitional lumbosacral anatomy with rudimentary L5-S1 disc. No stenosis or impingement.  IMPRESSION: 1. 3 mm anterolisthesis of L4 on L5 with associated severe bilateral facet arthropathy. 2. Additional mild to moderate facet arthropathy at L1-2 thru L3-4. 3. No significant canal or neural foraminal stenosis  within the lumbar spine. No impingement. 4. 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   Electronically Signed   By: Rise Mu M.D.   On: 12/17/2017 23:22   She reports that she has never smoked. She has never used smokeless tobacco.  Recent Labs    08/16/17 1629 11/29/17 1514 04/19/18 1658  HGBA1C 8.7 11.9* 10.0    Objective:  VS:  HT:    WT:   BMI:     BP:136/83  HR:68bpm  TEMP: ( )  RESP:  Physical Exam  Ortho Exam Imaging: Xr C-arm No Report  Result Date: 05/30/2018 Please see Notes or Procedures tab for imaging impression.   Past Medical/Family/Surgical/Social History: Medications & Allergies reviewed per EMR, new medications updated. Patient Active Problem List   Diagnosis Date Noted  . Sciatica of left side 12/02/2017  . Class 3 severe obesity due to excess calories with serious comorbidity and body mass index (BMI) of 40.0 to 44.9 in adult (HCC) 04/19/2017  . GAD (generalized anxiety disorder) 04/19/2017  . Psychophysiological insomnia 04/19/2017  . Type 2 diabetes mellitus with complication, with long-term current use of insulin (HCC) 04/19/2017   Past Medical History:  Diagnosis Date  . Anxiety   . Depression   . Diabetes mellitus without complication (HCC)   . GERD (gastroesophageal reflux disease)   . Headache   . Neuropathy    Family History  Problem Relation Age of Onset  . Hyperlipidemia Mother   . Hypertension Mother   .  Stroke Mother   . Depression Mother   . Drug abuse Mother   . Hyperlipidemia Father   . Hypertension Father   . Stroke Father   . Bipolar disorder Father   . Hyperlipidemia Brother   . Hypertension Brother   . Anxiety disorder Brother    Past Surgical History:  Procedure Laterality Date  . APPENDECTOMY    . CHOLECYSTECTOMY    . TONSILLECTOMY AND ADENOIDECTOMY     Social History   Occupational History  . Not on file  Tobacco Use  . Smoking status: Never Smoker    . Smokeless tobacco: Never Used  Substance and Sexual Activity  . Alcohol use: No  . Drug use: No  . Sexual activity: Never    Birth control/protection: None

## 2018-06-06 ENCOUNTER — Encounter (HOSPITAL_COMMUNITY): Payer: Self-pay | Admitting: Psychiatry

## 2018-06-06 ENCOUNTER — Ambulatory Visit (INDEPENDENT_AMBULATORY_CARE_PROVIDER_SITE_OTHER): Payer: Medicare Other | Admitting: Psychiatry

## 2018-06-06 VITALS — BP 136/82 | HR 76 | Ht 64.0 in | Wt 226.0 lb

## 2018-06-06 DIAGNOSIS — M2550 Pain in unspecified joint: Secondary | ICD-10-CM | POA: Diagnosis not present

## 2018-06-06 DIAGNOSIS — Z818 Family history of other mental and behavioral disorders: Secondary | ICD-10-CM

## 2018-06-06 DIAGNOSIS — G8929 Other chronic pain: Secondary | ICD-10-CM

## 2018-06-06 DIAGNOSIS — F4310 Post-traumatic stress disorder, unspecified: Secondary | ICD-10-CM

## 2018-06-06 DIAGNOSIS — Z79899 Other long term (current) drug therapy: Secondary | ICD-10-CM

## 2018-06-06 DIAGNOSIS — F410 Panic disorder [episodic paroxysmal anxiety] without agoraphobia: Secondary | ICD-10-CM

## 2018-06-06 DIAGNOSIS — M549 Dorsalgia, unspecified: Secondary | ICD-10-CM

## 2018-06-06 DIAGNOSIS — R202 Paresthesia of skin: Secondary | ICD-10-CM

## 2018-06-06 DIAGNOSIS — F515 Nightmare disorder: Secondary | ICD-10-CM

## 2018-06-06 DIAGNOSIS — R450 Nervousness: Secondary | ICD-10-CM | POA: Diagnosis not present

## 2018-06-06 DIAGNOSIS — F3131 Bipolar disorder, current episode depressed, mild: Secondary | ICD-10-CM

## 2018-06-06 DIAGNOSIS — Z915 Personal history of self-harm: Secondary | ICD-10-CM

## 2018-06-06 DIAGNOSIS — Z813 Family history of other psychoactive substance abuse and dependence: Secondary | ICD-10-CM

## 2018-06-06 DIAGNOSIS — R634 Abnormal weight loss: Secondary | ICD-10-CM

## 2018-06-06 DIAGNOSIS — Z79891 Long term (current) use of opiate analgesic: Secondary | ICD-10-CM

## 2018-06-06 DIAGNOSIS — Z87828 Personal history of other (healed) physical injury and trauma: Secondary | ICD-10-CM

## 2018-06-06 DIAGNOSIS — Z6838 Body mass index (BMI) 38.0-38.9, adult: Secondary | ICD-10-CM

## 2018-06-06 DIAGNOSIS — Z91410 Personal history of adult physical and sexual abuse: Secondary | ICD-10-CM

## 2018-06-06 MED ORDER — CHLORPROMAZINE HCL 50 MG PO TABS: 50.0000 mg | ORAL_TABLET | Freq: Every day | ORAL | 0 refills | Status: DC

## 2018-06-06 MED ORDER — CLONAZEPAM 0.5 MG PO TABS: 0.5000 mg | ORAL_TABLET | Freq: Three times a day (TID) | ORAL | 2 refills | Status: DC | PRN

## 2018-06-06 MED ORDER — LAMOTRIGINE 200 MG PO TABS: 200.0000 mg | ORAL_TABLET | Freq: Every day | ORAL | 0 refills | Status: DC
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
## 2018-06-14 ENCOUNTER — Other Ambulatory Visit: Payer: Self-pay | Admitting: Family Medicine

## 2018-06-14 DIAGNOSIS — M5432 Sciatica, left side: Secondary | ICD-10-CM

## 2018-06-14 DIAGNOSIS — E118 Type 2 diabetes mellitus with unspecified complications: Secondary | ICD-10-CM

## 2018-06-14 DIAGNOSIS — Z794 Long term (current) use of insulin: Secondary | ICD-10-CM

## 2018-06-15 NOTE — Telephone Encounter (Signed)
OK to refill

## 2018-06-16 ENCOUNTER — Encounter: Payer: Self-pay | Admitting: Family Medicine

## 2018-06-16 ENCOUNTER — Encounter (INDEPENDENT_AMBULATORY_CARE_PROVIDER_SITE_OTHER): Payer: Self-pay | Admitting: Physical Medicine and Rehabilitation

## 2018-06-29 ENCOUNTER — Other Ambulatory Visit: Payer: Self-pay | Admitting: Family Medicine

## 2018-06-29 DIAGNOSIS — E118 Type 2 diabetes mellitus with unspecified complications: Secondary | ICD-10-CM

## 2018-06-29 DIAGNOSIS — Z794 Long term (current) use of insulin: Secondary | ICD-10-CM

## 2018-07-14 ENCOUNTER — Encounter (INDEPENDENT_AMBULATORY_CARE_PROVIDER_SITE_OTHER): Payer: Self-pay | Admitting: Physical Medicine and Rehabilitation

## 2018-07-18 ENCOUNTER — Encounter: Payer: Self-pay | Admitting: Family Medicine

## 2018-07-18 DIAGNOSIS — M5432 Sciatica, left side: Secondary | ICD-10-CM

## 2018-07-18 MED ORDER — HYDROCODONE-ACETAMINOPHEN 10-325 MG PO TABS: 1.0000 | ORAL_TABLET | Freq: Three times a day (TID) | ORAL | 0 refills | Status: DC | PRN

## 2018-07-25 ENCOUNTER — Ambulatory Visit (INDEPENDENT_AMBULATORY_CARE_PROVIDER_SITE_OTHER): Payer: Medicare Other | Admitting: Licensed Clinical Social Worker

## 2018-07-25 ENCOUNTER — Encounter (HOSPITAL_COMMUNITY): Payer: Self-pay | Admitting: Licensed Clinical Social Worker

## 2018-07-25 DIAGNOSIS — F3131 Bipolar disorder, current episode depressed, mild: Secondary | ICD-10-CM | POA: Diagnosis not present

## 2018-07-25 NOTE — Progress Notes (Signed)
   THERAPIST PROGRESS NOTE  Session Time: 4:30pm-5:30pm  Participation Level: Active  Behavioral Response: NeatAlertDepressed  Type of Therapy: Individual Therapy  Treatment Goals addressed: Improve psychiatric symptoms, improve unhelpful thought patterns, interpersonal relationship skills, emotional regulation (decrease emotional eating, decrease angry outburst),   Interventions: Motivational Interviewing, CBT, psycho-education, Mindfulness and Grounding techniques  Summary: [name] is a 47 y.o. female who presents with Bipolar Affective Disorder, currently depressed, mild.    Suicidal/Homicidal: No without intent/plan  Therapist Response: Rowena met with clinician for an individual session. [name] discussed her psychiatric symptoms, her current life events and her homework. [name] processed changes in her living environment due to son having another baby and needing the space in the home. Boots reports she is now living in her ex-husband's condo with her youngest son (age 59). Clinician explored interactions with son and noted opposite schedules allowing for a lot of space and time to herself. Clinician discussed the concerns about friendship with ex-husband's soon to be ex-wife. [name] reports they have become friends, but when she talks about her marriage to her ex-husband, she feels uncomfortable. [name] processed her feelings about the matter and noted that she is better left out of it and she does not want to know about any details of their divorce or his cheating. Clinician reflected thoughts and feelings using MI and assisted in developing a plan to communicate her feelings.   Plan: Return again in 4 weeks.  Diagnosis:     Axis I: Bipolar Affective Disorder, currently depressed, mild.    Mindi Curling 07/25/2018

## 2018-07-26 ENCOUNTER — Encounter: Payer: Self-pay | Admitting: Family Medicine

## 2018-07-26 ENCOUNTER — Ambulatory Visit (INDEPENDENT_AMBULATORY_CARE_PROVIDER_SITE_OTHER): Payer: Medicare Other | Admitting: Family Medicine

## 2018-07-26 VITALS — BP 124/74 | HR 77 | Temp 98.6°F | Ht 64.0 in | Wt 222.0 lb

## 2018-07-26 DIAGNOSIS — E118 Type 2 diabetes mellitus with unspecified complications: Secondary | ICD-10-CM | POA: Diagnosis not present

## 2018-07-26 DIAGNOSIS — M5432 Sciatica, left side: Secondary | ICD-10-CM

## 2018-07-26 DIAGNOSIS — E119 Type 2 diabetes mellitus without complications: Secondary | ICD-10-CM

## 2018-07-26 DIAGNOSIS — Z6841 Body Mass Index (BMI) 40.0 and over, adult: Secondary | ICD-10-CM | POA: Diagnosis not present

## 2018-07-26 DIAGNOSIS — Z794 Long term (current) use of insulin: Secondary | ICD-10-CM

## 2018-07-26 LAB — POCT GLYCOSYLATED HEMOGLOBIN (HGB A1C): Hemoglobin A1C: 7.1 % — AB (ref 4.0–5.6)

## 2018-07-26 MED ORDER — HYDROCODONE-ACETAMINOPHEN 10-325 MG PO TABS: 1.0000 | ORAL_TABLET | Freq: Three times a day (TID) | ORAL | 0 refills | Status: AC | PRN

## 2018-07-26 NOTE — Progress Notes (Signed)
Jodi Wolfe is a 47 y.o. female is here for follow up.  History of Present Illness:   HPI:  1. Type 2 diabetes mellitus with complication, with long-term current use of insulin (HCC).   Current symptoms: no polyuria or polydipsia, no chest pain, dyspnea or TIA's, no unusual visual symptoms, no hypoglycemia, weight has decreased.   Lab Results  Component Value Date   HGBA1C 7.1 (A) 07/26/2018    Lab Results  Component Value Date   MICROALBUR 2.4 (H) 04/19/2018    Lab Results  Component Value Date   CHOL 235 (H) 04/19/2017   HDL 59.50 04/19/2017   LDLCALC 149 (H) 04/19/2017   TRIG 131.0 04/19/2017   CHOLHDL 4 04/19/2017     Wt Readings from Last 3 Encounters:  07/26/18 222 lb (100.7 kg)  04/19/18 239 lb 9.6 oz (108.7 kg)  01/10/18 239 lb 3.2 oz (108.5 kg)   BP Readings from Last 3 Encounters:  07/26/18 124/74  05/30/18 136/83  04/19/18 125/72   Lab Results  Component Value Date   CREATININE 0.60 11/29/2017    2. Class 3 severe obesity due to excess calories with serious comorbidity and body mass index (BMI) of 40.0 to 44.9 in adult [facility](HCC).   Wt Readings from Last 3 Encounters:  07/26/18 222 lb (100.7 kg)  04/19/18 239 lb 9.6 oz (108.7 kg)  01/10/18 239 lb 3.2 oz (108.5 kg)    3. Sciatica of left side. Ongoing. Injections with PMR. Using opioid TID generally with some relief.     Health Maintenance Due  Topic Date Due  . OPHTHALMOLOGY EXAM  07/05/2018   Depression screen PHQ 2/9 04/19/2017  Decreased Interest 0  Down, Depressed, Hopeless 0  PHQ - 2 Score 0   PMHx, SurgHx, SocialHx, FamHx, Medications, and Allergies were reviewed in the Visit Navigator and updated as appropriate.   Patient Active Problem List   Diagnosis Date Noted  . Sciatica of left side 12/02/2017  . Class 3 severe obesity due to excess calories with serious comorbidity and body mass index (BMI) of 40.0 to 44.9 in adult (HCC) 04/19/2017  . GAD (generalized anxiety disorder) 04/19/2017   . Psychophysiological insomnia 04/19/2017  . Type 2 diabetes mellitus with complication, with long-term current use of insulin (HCC) 04/19/2017   Social History   Tobacco Use  . Smoking status: Never Smoker  . Smokeless tobacco: Never Used  Substance Use Topics  . Alcohol use: No  . Drug use: No   Current Medications and Allergies:   Current Outpatient Medications:  .  chlorproMAZINE (THORAZINE) 50 MG tablet, Take 1 tablet (50 mg total) by mouth at bedtime., Disp: 90 tablet, Rfl: 0 .  clonazePAM (KLONOPIN) 0.5 MG tablet, Take 1 tablet (0.5 mg total) by mouth 3 (three) times daily as needed for anxiety., Disp: 90 tablet, Rfl: 2 .  esomeprazole (NEXIUM) 40 MG capsule, TAKE 1 CAPSULE BY MOUTH EVERY DAY, Disp: 90 capsule, Rfl: 1 .  gabapentin (NEURONTIN) 600 MG tablet, TAKE 1 TABLET BY MOUTH THREE TIMES A DAY, Disp: 270 tablet, Rfl: 0 .  lamoTRIgine (LAMICTAL) 200 MG tablet, Take 1 tablet (200 mg total) by mouth daily., Disp: 90 tablet, Rfl: 0 .  LEVEMIR FLEXTOUCH 100 UNIT/ML Pen, INJECT 20 UNITS UNDER THE SKIN DAILY AT 10PM (Patient taking differently: 26UNITS UNDER THE SKIN DAILY AT 10PM), Disp: 3 mL, Rfl: 5 .  naproxen (NAPROSYN) 500 MG tablet, Take 0.5 tablets (250 mg total) by mouth 2 (two) times daily  with a meal., Disp: 90 tablet, Rfl: 3 .  ondansetron (ZOFRAN-ODT) 4 MG disintegrating tablet, TAKE 1 TABLET BY MOUTH EVERY 8 HOURS AS NEEDED FOR NAUSEA AND VOMITING, Disp: 20 tablet, Rfl: 0 .  Semaglutide (OZEMPIC) 1 MG/DOSE SOPN, Inject 1 mg into the skin once a week., Disp: 2 pen, Rfl: 6 .  [START ON 09/23/2018] HYDROcodone-acetaminophen (NORCO) 10-325 MG tablet, Take 1 tablet by mouth every 8 (eight) hours as needed for up to 28 days., Disp: 90 tablet, Rfl: 0 .  [START ON 08/26/2018] HYDROcodone-acetaminophen (NORCO) 10-325 MG tablet, Take 1 tablet by mouth every 8 (eight) hours as needed for up to 28 days., Disp: 90 tablet, Rfl: 0 .  HYDROcodone-acetaminophen (NORCO) 10-325 MG tablet,  Take 1 tablet by mouth every 8 (eight) hours as needed., Disp: 90 tablet, Rfl: 0   Allergies  Allergen Reactions  . Morphine And Related   . Sulfa Antibiotics    Review of Systems   Pertinent items are noted in the HPI. Otherwise, ROS is negative.  Vitals:   Vitals:   07/26/18 1452  BP: 124/74  Pulse: 77  Temp: 98.6 F (37 C)  TempSrc: Oral  SpO2: 98%  Weight: 222 lb (100.7 kg)  Height: 5\' 4"  (1.626 m)     Body mass index is 38.11 kg/m.  Physical Exam:   Physical Exam  Constitutional: She appears well-nourished.  HENT:  Head: Normocephalic and atraumatic.  Eyes: Pupils are equal, round, and reactive to light. EOM are normal.  Neck: Normal range of motion. Neck supple.  Cardiovascular: Normal rate, regular rhythm, normal heart sounds and intact distal pulses.  Pulmonary/Chest: Effort normal.  Abdominal: Soft.  Skin: Skin is warm.  Psychiatric: She has a normal mood and affect. Her behavior is normal.  Nursing note and vitals reviewed.  Diabetic Foot Exam - Simple   Simple Foot Form Diabetic Foot exam was performed with the following findings:  Yes 07/26/2018  3:01 PM  Visual Inspection No deformities, no ulcerations, no other skin breakdown bilaterally:  Yes Sensation Testing Intact to touch and monofilament testing bilaterally:  Yes Pulse Check Posterior Tibialis and Dorsalis pulse intact bilaterally:  Yes Comments     Results for orders placed or performed in visit on 07/26/18  POCT glycosylated hemoglobin (Hb A1C)  Result Value Ref Range   Hemoglobin A1C 7.1 (A) 4.0 - 5.6 %   HbA1c POC (<> result, manual entry)  4.0 - 5.6 %   HbA1c, POC (prediabetic range)  5.7 - 6.4 %   HbA1c, POC (controlled diabetic range)  0.0 - 7.0 %    Assessment and Plan:   Diagnoses and all orders for this visit:  Type 2 diabetes mellitus with complication, with long-term current use of insulin (HCC) Comments: Doing well with current treatment. Will continue for  now. Orders: -     POCT glycosylated hemoglobin (Hb A1C)  Class 3 severe obesity due to excess calories with serious comorbidity and body mass index (BMI) of 40.0 to 44.9 in adult [facility]) Comments: Down 17 pounds with the help of Ozempic.   Sciatica of left side Comments: Continues to work with Pain Management. Upcoming injection.  Orders: -     HYDROcodone-acetaminophen (NORCO) 10-325 MG tablet; Take 1 tablet by mouth every 8 (eight) hours as needed for up to 28 days. -     HYDROcodone-acetaminophen (NORCO) 10-325 MG tablet; Take 1 tablet by mouth every 8 (eight) hours as needed for up to 28 days. -  HYDROcodone-acetaminophen (NORCO) 10-325 MG tablet; Take 1 tablet by mouth every 8 (eight) hours as needed.  Encounter for diabetic foot exam (HCC)    . Reviewed expectations re: course of current medical issues. . Discussed self-management of symptoms. . Outlined signs and symptoms indicating need for more acute intervention. . Patient verbalized understanding and all questions were answered. Marland Kitchen Health Maintenance issues including appropriate healthy diet, exercise, and smoking avoidance were discussed with patient. . See orders for this visit as documented in the electronic medical record. . Patient received an After Visit Summary.  Helane Rima, DO [place] [place] 07/27/2018  Future Appointments  Date Time Provider Department Center  08/08/2018  2:30 PM Tyrell Antonio, MD [facility] None  08/23/2018  2:30 PM Maia Plan, Shanda Bumps R [facility] None  09/05/2018  2:00 PM Arfeen, Phillips Grout, MD [facility] None  10/31/2018  3:40 PM Helane Rima, DO [facility]

## 2018-08-01 ENCOUNTER — Other Ambulatory Visit: Payer: Self-pay | Admitting: Family Medicine

## 2018-08-01 DIAGNOSIS — E118 Type 2 diabetes mellitus with unspecified complications: Secondary | ICD-10-CM

## 2018-08-01 DIAGNOSIS — Z794 Long term (current) use of insulin: Secondary | ICD-10-CM

## 2018-08-04 ENCOUNTER — Encounter (INDEPENDENT_AMBULATORY_CARE_PROVIDER_SITE_OTHER): Payer: Medicare Other | Admitting: Physical Medicine and Rehabilitation

## 2018-08-08 ENCOUNTER — Ambulatory Visit (INDEPENDENT_AMBULATORY_CARE_PROVIDER_SITE_OTHER): Payer: Self-pay

## 2018-08-08 ENCOUNTER — Ambulatory Visit (INDEPENDENT_AMBULATORY_CARE_PROVIDER_SITE_OTHER): Payer: Medicare Other | Admitting: Physical Medicine and Rehabilitation

## 2018-08-08 ENCOUNTER — Encounter (INDEPENDENT_AMBULATORY_CARE_PROVIDER_SITE_OTHER): Payer: Self-pay | Admitting: Physical Medicine and Rehabilitation

## 2018-08-08 VITALS — BP 115/71 | HR 92

## 2018-08-08 DIAGNOSIS — M5416 Radiculopathy, lumbar region: Secondary | ICD-10-CM | POA: Diagnosis not present

## 2018-08-08 MED ORDER — METHYLPREDNISOLONE ACETATE 80 MG/ML IJ SUSP
80.0000 mg | Freq: Once | INTRAMUSCULAR | Status: AC
  Administered 2018-08-08: 80 mg

## 2018-08-08 NOTE — Progress Notes (Signed)
 .  Numeric Pain Rating Scale and Functional Assessment Average Pain 8   In the last MONTH (on 0-10 scale) has pain interfered with the following?  1. General activity like being  able to carry out your everyday physical activities such as walking, climbing stairs, carrying groceries, or moving a chair?  Rating(6)   +Driver, -BT, -Dye Allergies.  

## 2018-08-08 NOTE — Patient Instructions (Signed)

## 2018-08-09 NOTE — Procedures (Signed)
Lumbar Epidural Steroid Injection - Interlaminar Approach with Fluoroscopic Guidance  Patient: Jodi Wolfe      Date of Birth: June 05, 1971 MRN: [id number] PCP: Helane RimaWallace, Erica, DO      Visit Date: 08/08/2018   Universal Protocol:     Consent Given By: the patient  Position: PRONE  Additional Comments: Vital signs were monitored before and after the procedure. Patient was prepped and draped in the usual sterile fashion. The correct patient, procedure, and site was verified.   Injection Procedure Details:  Procedure Site One Meds Administered:  Meds ordered this encounter  Medications  . methylPREDNISolone acetate (DEPO-MEDROL) injection 80 mg     Laterality: Left  Location/Site:  L4-L5  Needle size: 20 G  Needle type: Tuohy  Needle Placement: Paramedian epidural  Findings:   -Comments: Excellent flow of contrast into the epidural space.  Procedure Details: Using a paramedian approach from the side mentioned above, the region overlying the inferior lamina was localized under fluoroscopic visualization and the soft tissues overlying this structure were infiltrated with 4 ml. of 1% Lidocaine without Epinephrine. The Tuohy needle was inserted into the epidural space using a paramedian approach.   The epidural space was localized using loss of resistance along with lateral and bi-planar fluoroscopic views.  After negative aspirate for air, blood, and CSF, a 2 ml. volume of Isovue-250 was injected into the epidural space and the flow of contrast was observed. Radiographs were obtained for documentation purposes.    The injectate was administered into the level noted above.   Additional Comments:  The patient tolerated the procedure well Dressing: Band-Aid    Post-procedure details: Patient was observed during the procedure. Post-procedure instructions were reviewed.  Patient left the clinic in stable condition.

## 2018-08-09 NOTE — Progress Notes (Signed)
Jodi Wolfe - 47 y.o. female MRN [id number]  Date of birth: May 25, 1971  Office Visit Note: Visit Date: 08/08/2018 PCP: Helane RimaWallace, Erica, DO Referred by: Helane RimaWallace, Erica, DO  Subjective: Chief Complaint  Patient presents with  . Lower Back - Pain  . Left Thigh - Pain   HPI: Jodi Wolfe is a 47 year old female with chronic pain syndrome and chronic mostly axial low back pain with some referral in the left thigh.  We have completed 2 diagnostic facet/medial branch blocks with 60% relief of her symptoms temporarily each time.  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  She has no focal weakness on exam.  Unfortunately from a chronic pain standpoint her case is complicated by depression anxiety and PTSD.  She is on 10 mg of hydrocodone 3 times a day as well as 600 mg of gabapentin 3 times a day.  Medications are managed by Dr. Earlene PlaterWallace who is her primary care physician.  At this point I think the next step at least one time is epidural injection to see how much relief she gets of the thigh pain.  If the epidural injection helps her greatly and is more beneficial than the facet joint block that she may have to competing diagnoses.  If she does not get much relief of that with my schedule her for radiofrequency ablation of the L4-5 facet joints and hopefully that will give her longer term relief of up to 60% like the medial branch blocks gave her.  I would think it 60% relief but hopefully she could wean off of the hydrocodone.   ROS Otherwise per HPI.  Assessment & Plan: Visit Diagnoses:  1. Lumbar radiculopathy     Plan: No additional findings.   Meds & Orders:  Meds ordered this encounter  Medications  . methylPREDNISolone acetate (DEPO-MEDROL) injection 80 mg    Orders Placed This Encounter  Procedures  . XR C-ARM NO REPORT  . Epidural Steroid injection    Follow-up:  Return if symptoms worsen or fail to improve, for Consider RFA.   Procedures: No procedures performed  Lumbar Epidural Steroid Injection - Interlaminar Approach with Fluoroscopic Guidance  Patient: Jodi Wolfe      Date of Birth: May 25, 1971 MRN: [id number] PCP: Helane RimaWallace, Erica, DO      Visit Date: 08/08/2018   Universal Protocol:     Consent Given By: the patient  Position: PRONE  Additional Comments: Vital signs were monitored before and after the procedure. Patient was prepped and draped in the usual sterile fashion. The correct patient, procedure, and site was verified.   Injection Procedure Details:  Procedure Site One Meds Administered:  Meds ordered this encounter  Medications  . methylPREDNISolone acetate (DEPO-MEDROL) injection 80 mg     Laterality: Left  Location/Site:  L4-L5  Needle size: 20 G  Needle type: Tuohy  Needle Placement: Paramedian epidural  Findings:   -Comments: Excellent flow of contrast into the epidural space.  Procedure Details: Using a paramedian approach from the side mentioned above, the region overlying the inferior lamina was localized under fluoroscopic visualization and the soft tissues overlying this structure were infiltrated with 4 ml. of 1% Lidocaine without Epinephrine. The Tuohy needle was inserted into the epidural space using a paramedian approach.   The epidural space was localized using loss of resistance along with lateral and bi-planar fluoroscopic views.  After negative aspirate for  air, blood, and CSF, a 2 ml. volume of Isovue-250 was injected into the epidural space and the flow of contrast was observed. Radiographs were obtained for documentation purposes.    The injectate was administered into the level noted above.   Additional Comments:  The patient tolerated the procedure well Dressing: Band-Aid    Post-procedure details: Patient was observed during the procedure. Post-procedure instructions were  reviewed.  Patient left the clinic in stable condition.   Clinical History: MRI LUMBAR SPINE WITHOUT CONTRAST  TECHNIQUE: Multiplanar, multisequence MR imaging of the lumbar spine was performed. No intravenous contrast was administered.  COMPARISON:  None.  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 L5-S1 disc is rudimentary.  Alignment: 3 mm anterolisthesis of L4 on L5. Exaggeration of the normal lumbar lordosis. Trace dextroscoliosis.  Vertebrae: Vertebral body heights maintained without evidence for acute or chronic fracture. Bone marrow signal intensity within normal limits. No discrete or worrisome osseous lesions.  Conus medullaris and cauda equina: Conus extends to the T12-L1 level. Conus and cauda equina appear normal.  Paraspinal and other soft tissues: Paraspinous soft tissues demonstrate no acute abnormality. Scattered parapelvic cysts noted within left kidney. Visualized visceral structures otherwise unremarkable.  Disc levels:  L1-2: Normal interspace. Mild facet and ligamentum flavum hypertrophy. No stenosis.  L2-3: Normal interspace. Mild to moderate facet and ligament flavum hypertrophy. No stenosis.  L3-4: Minimal annular disc bulge with disc desiccation. Mild to moderate facet and ligament flavum hypertrophy. No stenosis.  L4-5: 3 mm anterolisthesis. Disc desiccation without significant disc bulge. Associated severe facet arthrosis. No significant canal or neural foraminal narrowing. No impingement.  L5-S1: Transitional lumbosacral anatomy with rudimentary L5-S1 disc. No stenosis or impingement.  IMPRESSION: 1. 3 mm anterolisthesis of L4 on L5 with associated severe bilateral facet arthropathy. 2. Additional mild to moderate facet arthropathy at L1-2 thru L3-4. 3. No significant canal or neural foraminal stenosis within the lumbar spine. No impingement. 4. 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   Electronically Signed   By: Rise Mu M.D.   On: 12/17/2017 23:22   She reports that she has never smoked. She has never used smokeless tobacco.  Recent Labs    11/29/17 1514 04/19/18 1658 07/26/18 1513  HGBA1C 11.9* 10.0 7.1*    Objective:  VS:  HT:    WT:   BMI:     BP:115/71  HR:92bpm  TEMP: ( )  RESP:  Physical Exam  Ortho Exam Imaging: Xr C-arm No Report  Result Date: 08/08/2018 Please see Notes tab for imaging impression.   Past Medical/Family/Surgical/Social History: Medications & Allergies reviewed per EMR, new medications updated. Patient Active Problem List   Diagnosis Date Noted  . Sciatica of left side 12/02/2017  . Class 3 severe obesity due to excess calories with serious comorbidity and body mass index (BMI) of 40.0 to 44.9 in adult (HCC) 04/19/2017  . GAD (generalized anxiety disorder) 04/19/2017  . Psychophysiological insomnia 04/19/2017  . Type 2 diabetes mellitus with complication, with long-term current use of insulin (HCC) 04/19/2017   Past Medical History:  Diagnosis Date  . Anxiety   . Depression   . Diabetes mellitus without complication (HCC)   . GERD (gastroesophageal reflux disease)   . Headache   . Neuropathy    Family History  Problem Relation Age of Onset  . Hyperlipidemia Mother   . Hypertension Mother   . Stroke Mother   . Depression Mother   .  Drug abuse Mother   . Hyperlipidemia Father   . Hypertension Father   . Stroke Father   . Bipolar disorder Father   . Hyperlipidemia Brother   . Hypertension Brother   . Anxiety disorder Brother    Past Surgical History:  Procedure Laterality Date  . APPENDECTOMY    . CHOLECYSTECTOMY    . TONSILLECTOMY AND ADENOIDECTOMY     Social History   Occupational History  . Not on file  Tobacco Use  . Smoking status: Never Smoker  . Smokeless tobacco: Never Used  Substance and Sexual Activity  .  Alcohol use: No  . Drug use: No  . Sexual activity: Never    Birth control/protection: None

## 2018-08-23 ENCOUNTER — Ambulatory Visit (HOSPITAL_COMMUNITY): Payer: Medicare Other | Admitting: Licensed Clinical Social Worker

## 2018-08-24 ENCOUNTER — Other Ambulatory Visit: Payer: Self-pay | Admitting: Family Medicine

## 2018-08-24 DIAGNOSIS — E118 Type 2 diabetes mellitus with unspecified complications: Secondary | ICD-10-CM

## 2018-08-24 DIAGNOSIS — Z794 Long term (current) use of insulin: Secondary | ICD-10-CM

## 2018-08-25 NOTE — Telephone Encounter (Signed)
Patient just receive script on 08/01/18 for #20 ok to send in refill now?

## 2018-08-26 ENCOUNTER — Other Ambulatory Visit (HOSPITAL_COMMUNITY): Payer: Self-pay | Admitting: Psychiatry

## 2018-08-26 DIAGNOSIS — F410 Panic disorder [episodic paroxysmal anxiety] without agoraphobia: Secondary | ICD-10-CM

## 2018-09-01 ENCOUNTER — Other Ambulatory Visit: Payer: Self-pay | Admitting: Family Medicine

## 2018-09-01 DIAGNOSIS — M5432 Sciatica, left side: Secondary | ICD-10-CM

## 2018-09-05 ENCOUNTER — Telehealth (INDEPENDENT_AMBULATORY_CARE_PROVIDER_SITE_OTHER): Payer: Self-pay | Admitting: Physical Medicine and Rehabilitation

## 2018-09-05 ENCOUNTER — Ambulatory Visit (HOSPITAL_COMMUNITY): Payer: Self-pay | Admitting: Psychiatry

## 2018-09-05 NOTE — Telephone Encounter (Signed)
That was only 08/08/2018 so I am not sure other than repeating once, followed be repeat injection in 3 weeks to see if it will go longer. She could see spine surgeon for evaluation. Can follow with PCP.

## 2018-09-06 NOTE — Telephone Encounter (Signed)
Scheduled for 10/14 and 11/11 at 1600.

## 2018-09-08 ENCOUNTER — Other Ambulatory Visit: Payer: Self-pay | Admitting: Family Medicine

## 2018-09-08 ENCOUNTER — Other Ambulatory Visit (HOSPITAL_COMMUNITY): Payer: Self-pay | Admitting: Psychiatry

## 2018-09-08 DIAGNOSIS — F3131 Bipolar disorder, current episode depressed, mild: Secondary | ICD-10-CM

## 2018-09-08 DIAGNOSIS — K219 Gastro-esophageal reflux disease without esophagitis: Secondary | ICD-10-CM

## 2018-09-08 DIAGNOSIS — Z794 Long term (current) use of insulin: Secondary | ICD-10-CM

## 2018-09-08 DIAGNOSIS — E118 Type 2 diabetes mellitus with unspecified complications: Secondary | ICD-10-CM

## 2018-09-19 ENCOUNTER — Other Ambulatory Visit (HOSPITAL_COMMUNITY): Payer: Self-pay

## 2018-09-19 DIAGNOSIS — F3131 Bipolar disorder, current episode depressed, mild: Secondary | ICD-10-CM

## 2018-09-19 MED ORDER — CHLORPROMAZINE HCL 50 MG PO TABS: 50.0000 mg | ORAL_TABLET | Freq: Every day | ORAL | 0 refills | Status: DC

## 2018-09-19 MED ORDER — LAMOTRIGINE 200 MG PO TABS: 200.0000 mg | ORAL_TABLET | Freq: Every day | ORAL | 0 refills | Status: DC

## 2018-09-21 ENCOUNTER — Telehealth (HOSPITAL_COMMUNITY): Payer: Self-pay | Admitting: *Deleted

## 2018-09-21 DIAGNOSIS — F410 Panic disorder [episodic paroxysmal anxiety] without agoraphobia: Secondary | ICD-10-CM

## 2018-09-21 MED ORDER — CLONAZEPAM 0.5 MG PO TABS: 0.5000 mg | ORAL_TABLET | Freq: Three times a day (TID) | ORAL | 0 refills | Status: DC | PRN

## 2018-09-21 NOTE — Telephone Encounter (Signed)
Patient called for refill of Klonopin. Chart reviewed, refill given for 30 day supply, no refills. Next appointment 10/17/18. Notified patient of refill.

## 2018-10-10 ENCOUNTER — Encounter (INDEPENDENT_AMBULATORY_CARE_PROVIDER_SITE_OTHER): Payer: Medicare Other | Admitting: Physical Medicine and Rehabilitation

## 2018-10-12 ENCOUNTER — Other Ambulatory Visit (HOSPITAL_COMMUNITY): Payer: Self-pay | Admitting: Psychiatry

## 2018-10-12 DIAGNOSIS — F3131 Bipolar disorder, current episode depressed, mild: Secondary | ICD-10-CM

## 2018-10-16 ENCOUNTER — Other Ambulatory Visit (HOSPITAL_COMMUNITY): Payer: Self-pay | Admitting: Psychiatry

## 2018-10-16 DIAGNOSIS — F3131 Bipolar disorder, current episode depressed, mild: Secondary | ICD-10-CM

## 2018-10-17 ENCOUNTER — Encounter (INDEPENDENT_AMBULATORY_CARE_PROVIDER_SITE_OTHER): Payer: Medicare Other | Admitting: Physical Medicine and Rehabilitation

## 2018-10-17 ENCOUNTER — Encounter (HOSPITAL_COMMUNITY): Payer: Self-pay | Admitting: Psychiatry

## 2018-10-17 ENCOUNTER — Ambulatory Visit (INDEPENDENT_AMBULATORY_CARE_PROVIDER_SITE_OTHER): Payer: Medicare Other | Admitting: Psychiatry

## 2018-10-17 VITALS — BP 112/66 | HR 85 | Ht 62.5 in | Wt 224.0 lb

## 2018-10-17 DIAGNOSIS — F3131 Bipolar disorder, current episode depressed, mild: Secondary | ICD-10-CM

## 2018-10-17 DIAGNOSIS — F4321 Adjustment disorder with depressed mood: Secondary | ICD-10-CM

## 2018-10-17 DIAGNOSIS — F410 Panic disorder [episodic paroxysmal anxiety] without agoraphobia: Secondary | ICD-10-CM | POA: Diagnosis not present

## 2018-10-17 DIAGNOSIS — F4310 Post-traumatic stress disorder, unspecified: Secondary | ICD-10-CM | POA: Diagnosis not present

## 2018-10-17 MED ORDER — CLONAZEPAM 0.5 MG PO TABS: 0.5000 mg | ORAL_TABLET | Freq: Three times a day (TID) | ORAL | 2 refills | Status: DC | PRN

## 2018-10-17 MED ORDER — CHLORPROMAZINE HCL 50 MG PO TABS: 50.0000 mg | ORAL_TABLET | Freq: Every day | ORAL | 2 refills | Status: DC

## 2018-10-17 MED ORDER — LAMOTRIGINE 200 MG PO TABS: 200.0000 mg | ORAL_TABLET | Freq: Every day | ORAL | 2 refills | Status: DC
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
## 2018-10-20 ENCOUNTER — Other Ambulatory Visit: Payer: Self-pay | Admitting: Family Medicine

## 2018-10-21 NOTE — Telephone Encounter (Signed)
Patient stated that she is not taking medication.

## 2018-10-25 ENCOUNTER — Other Ambulatory Visit: Payer: Self-pay | Admitting: Family Medicine

## 2018-10-25 DIAGNOSIS — Z794 Long term (current) use of insulin: Secondary | ICD-10-CM

## 2018-10-25 DIAGNOSIS — E118 Type 2 diabetes mellitus with unspecified complications: Secondary | ICD-10-CM

## 2018-10-30 NOTE — Progress Notes (Deleted)
Jodi Wolfe is a 47 y.o. female is here for follow up.  History of Present Illness:   HPI: See Assessment and Plan section for Problem Based Charting of issues discussed today.   Health Maintenance Due  Topic Date Due  . OPHTHALMOLOGY EXAM  07/05/2018  . INFLUENZA VACCINE  07/28/2018   Depression screen PHQ 2/9 04/19/2017  Decreased Interest 0  Down, Depressed, Hopeless 0  PHQ - 2 Score 0   PMHx, SurgHx, SocialHx, FamHx, Medications, and Allergies were reviewed in the Visit Navigator and updated as appropriate.   Patient Active Problem List   Diagnosis Date Noted  . Sciatica of left side 12/02/2017  . Class 3 severe obesity due to excess calories with serious comorbidity and body mass index (BMI) of 40.0 to 44.9 in adult (HCC) 04/19/2017  . GAD (generalized anxiety disorder) 04/19/2017  . Psychophysiological insomnia 04/19/2017  . Type 2 diabetes mellitus with complication, with long-term current use of insulin (HCC) 04/19/2017   Social History   Tobacco Use  . Smoking status: Never Smoker  . Smokeless tobacco: Never Used  Substance Use Topics  . Alcohol use: No  . Drug use: No   Current Medications and Allergies:   Current Outpatient Medications:  .  chlorproMAZINE (THORAZINE) 50 MG tablet, Take 1 tablet (50 mg total) by mouth at bedtime., Disp: 30 tablet, Rfl: 2 .  clonazePAM (KLONOPIN) 0.5 MG tablet, Take 1 tablet (0.5 mg total) by mouth 3 (three) times daily as needed for anxiety., Disp: 90 tablet, Rfl: 2 .  esomeprazole (NEXIUM) 40 MG capsule, TAKE 1 CAPSULE BY MOUTH EVERY DAY, Disp: 90 capsule, Rfl: 1 .  gabapentin (NEURONTIN) 600 MG tablet, TAKE 1 TABLET BY MOUTH THREE TIMES A DAY, Disp: 270 tablet, Rfl: 0 .  lamoTRIgine (LAMICTAL) 200 MG tablet, Take 1 tablet (200 mg total) by mouth daily., Disp: 30 tablet, Rfl: 2 .  LEVEMIR FLEXTOUCH 100 UNIT/ML Pen, INJECT 20 UNITS UNDER THE SKIN DAILY AT 10PM (Patient taking differently: 26UNITS UNDER THE SKIN DAILY AT 10PM),  Disp: 3 mL, Rfl: 5 .  naproxen (NAPROSYN) 500 MG tablet, Take 0.5 tablets (250 mg total) by mouth 2 (two) times daily with a meal., Disp: 90 tablet, Rfl: 3 .  ondansetron (ZOFRAN-ODT) 4 MG disintegrating tablet, TAKE 1 TABLET BY MOUTH EVERY 8 HOURS AS NEEDED FOR NAUSEA AND VOMITING, Disp: 20 tablet, Rfl: 0 .  Semaglutide (OZEMPIC) 1 MG/DOSE SOPN, Inject 1 mg into the skin once a week., Disp: 2 pen, Rfl: 6  Allergies  Allergen Reactions  . Morphine And Related   . Sulfa Antibiotics    Review of Systems   Pertinent items are noted in the HPI. Otherwise, ROS is negative.  Vitals:  There were no vitals filed for this visit.   There is no height or weight on file to calculate BMI.  Physical Exam:   Physical Exam  Results for orders placed or performed in visit on 07/26/18  POCT glycosylated hemoglobin (Hb A1C)  Result Value Ref Range   Hemoglobin A1C 7.1 (A) 4.0 - 5.6 %   HbA1c POC (<> result, manual entry)  4.0 - 5.6 %   HbA1c, POC (prediabetic range)  5.7 - 6.4 %   HbA1c, POC (controlled diabetic range)  0.0 - 7.0 %    Assessment and Plan:   No problem-specific Assessment & Plan notes found for this encounter.   No orders of the defined types were placed in this encounter.   No orders  of the defined types were placed in this encounter.   . Reviewed expectations re: course of current medical issues. . Discussed self-management of symptoms. . Outlined signs and symptoms indicating need for more acute intervention. . Patient verbalized understanding and all questions were answered. Marland Kitchen Health Maintenance issues including appropriate healthy diet, exercise, and smoking avoidance were discussed with patient. . See orders for this visit as documented in the electronic medical record. . Patient received an After Visit Summary.  Helane Rima, DO [place], [facility] 10/30/2018

## 2018-10-31 ENCOUNTER — Encounter: Payer: Self-pay | Admitting: Family Medicine

## 2018-10-31 ENCOUNTER — Ambulatory Visit: Payer: Medicare Other | Admitting: Family Medicine

## 2018-11-01 ENCOUNTER — Other Ambulatory Visit: Payer: Self-pay

## 2018-11-01 ENCOUNTER — Other Ambulatory Visit: Payer: Self-pay | Admitting: Surgical

## 2018-11-01 MED ORDER — HYDROCODONE-ACETAMINOPHEN 10-325 MG PO TABS: 1.0000 | ORAL_TABLET | Freq: Three times a day (TID) | ORAL | 0 refills | Status: DC | PRN

## 2018-11-01 NOTE — Telephone Encounter (Signed)
Please advise 

## 2018-11-01 NOTE — Telephone Encounter (Signed)
Patient called office we have printed script at reception. She called office will not make before we close she would like sent to pharmacy. I have sent paper script to shred.

## 2018-11-01 NOTE — Telephone Encounter (Signed)
See note  Copied from CRM [id number]. Topic: Quick Communication - Rx Refill/Question >> Nov 01, 2018  2:01 PM Crist Infante wrote: Medication: HYDROcodone-acetaminophen (NORCO) 10-325 MG tablet  Pt states she talked with someone earlier to have this Rx called in to the pharmacy. But pt states it is not at the pharmacy.  I do not see a message. Pt will try to pick up today if it cannot be called to the pharmacy.

## 2018-11-03 ENCOUNTER — Other Ambulatory Visit: Payer: Self-pay | Admitting: Family Medicine

## 2018-11-03 DIAGNOSIS — Z794 Long term (current) use of insulin: Secondary | ICD-10-CM

## 2018-11-03 DIAGNOSIS — E118 Type 2 diabetes mellitus with unspecified complications: Secondary | ICD-10-CM

## 2018-11-07 ENCOUNTER — Encounter (INDEPENDENT_AMBULATORY_CARE_PROVIDER_SITE_OTHER): Payer: Medicare Other | Admitting: Physical Medicine and Rehabilitation

## 2018-11-07 ENCOUNTER — Ambulatory Visit: Payer: Medicare Other | Admitting: Family Medicine

## 2018-11-07 NOTE — Progress Notes (Signed)
Jodi Wolfe is a 47 y.o. female is here for follow up.  History of Present Illness:   HPI: See Assessment and Plan section for Problem Based Charting of issues discussed today.   Health Maintenance Due  Topic Date Due  . OPHTHALMOLOGY EXAM  07/05/2018  . INFLUENZA VACCINE  07/28/2018   Depression screen PHQ 2/9 04/19/2017  Decreased Interest 0  Down, Depressed, Hopeless 0  PHQ - 2 Score 0   PMHx, SurgHx, SocialHx, FamHx, Medications, and Allergies were reviewed in the Visit Navigator and updated as appropriate.   Patient Active Problem List   Diagnosis Date Noted  . Chronic back pain 11/10/2018  . PTSD (post-traumatic stress disorder) 11/10/2018  . Bipolar depression (HCC), followed by Psychiatry 11/10/2018  . Sciatica of left side 12/02/2017  . Class 3 severe obesity due to excess calories with serious comorbidity and body mass index (BMI) of 40.0 to 44.9 in adult (HCC) 04/19/2017  . GAD (generalized anxiety disorder) 04/19/2017  . Psychophysiological insomnia 04/19/2017  . Type 2 diabetes mellitus with complication, with long-term current use of insulin (HCC) 04/19/2017   Social History   Tobacco Use  . Smoking status: Never Smoker  . Smokeless tobacco: Never Used  Substance Use Topics  . Alcohol use: No  . Drug use: No   Current Medications and Allergies:   .  chlorproMAZINE (THORAZINE) 50 MG tablet, Take 1 tablet (50 mg total) by mouth at bedtime., Disp: 30 tablet, Rfl: 2 .  clonazePAM (KLONOPIN) 0.5 MG tablet, Take 1 tablet (0.5 mg total) by mouth 3 (three) times daily as needed for anxiety., Disp: 90 tablet, Rfl: 2 .  esomeprazole (NEXIUM) 40 MG capsule, TAKE 1 CAPSULE BY MOUTH EVERY DAY, Disp: 90 capsule, Rfl: 1 .  gabapentin (NEURONTIN) 600 MG tablet, TAKE 1 TABLET BY MOUTH THREE TIMES A DAY, Disp: 270 tablet, Rfl: 0 .  HYDROcodone-acetaminophen (NORCO) 10-325 MG tablet, Take 1 tablet by mouth every 8 (eight) hours as needed for severe pain (Chronic pain).,  Disp: 90 tablet, Rfl: 0 .  lamoTRIgine (LAMICTAL) 200 MG tablet, Take 1 tablet (200 mg total) by mouth daily., Disp: 30 tablet, Rfl: 2 .  LEVEMIR FLEXTOUCH 100 UNIT/ML Pen, INJECT 20 UNITS UNDER THE SKIN DAILY AT 10PM (Patient taking differently: 26UNITS UNDER THE SKIN DAILY AT 10PM), Disp: 3 mL, Rfl: 5 .  naproxen (NAPROSYN) 500 MG tablet, Take 0.5 tablets (250 mg total) by mouth 2 (two) times daily with a meal., Disp: 90 tablet, Rfl: 3 .  ondansetron (ZOFRAN-ODT) 4 MG disintegrating tablet, TAKE 1 TABLET BY MOUTH EVERY 8 HOURS AS NEEDED FOR NAUSEA AND VOMITING, Disp: 20 tablet, Rfl: 0 .  OZEMPIC, 1 MG/DOSE, 2 MG/1.5ML SOPN, INJECT 1 MG INTO THE SKIN ONCE A WEEK., Disp: 3 pen, Rfl: 6   Allergies  Allergen Reactions  . Morphine And Related   . Sulfa Antibiotics    Review of Systems   Pertinent items are noted in the HPI. Otherwise, ROS is negative.  Vitals:   Vitals:   11/09/18 1433  BP: 118/68  Pulse: 94  Temp: (!) 97.4 F (36.3 C)  TempSrc: Oral  SpO2: 94%  Weight: 227 lb 3.2 oz (103.1 kg)  Height: 5\' 4"  (1.626 m)     Body mass index is 39 kg/m.  Physical Exam:   Physical Exam  Constitutional: She appears well-nourished.  HENT:  Head: Normocephalic and atraumatic.  Eyes: Pupils are equal, round, and reactive to light. EOM are normal.  Neck:  Normal range of motion. Neck supple.  Cardiovascular: Normal rate, regular rhythm, normal heart sounds and intact distal pulses.  Pulmonary/Chest: Effort normal.  Abdominal: Soft.  Musculoskeletal:       Lumbar back: She exhibits decreased range of motion, pain and spasm.  + SLR right side.  Skin: Skin is warm.  Psychiatric: She has a normal mood and affect. Her behavior is normal.  Nursing note and vitals reviewed.  Assessment and Plan:   Type 2 diabetes mellitus with complication, with long-term current use of insulin (HCC) Medication compliance: compliant all of the time, diabetic diet compliance: noncompliant some of the  time, home glucose monitoring: increasing to 200s again, further diabetic ROS: no polyuria or polydipsia, no chest pain, dyspnea or TIA's, no unusual visual symptoms, no medication side effects noted.  Diabetes Health Maintenance Due  Topic Date Due  . OPHTHALMOLOGY EXAM  07/05/2018  . HEMOGLOBIN A1C  01/26/2019  . URINE MICROALBUMIN  04/20/2019  . FOOT EXAM  07/27/2019   Diabetes QM Metrics Latest Ref Rng & Units 07/26/2018 04/19/2018  HbA1c 4.0 - 5.6 % 7.1(A) 10.0  Microalb 0.0 - 1.9 mg/dL - 2.4(H)  Micro/Creat Ratio 0.0 - 30.0 mg/g - 4.7  Some recent data might be hidden   BP& BMI  11/09/2018 08/08/2018 07/26/2018 05/30/2018 04/19/2018  BP 118/68 115/71 124/74 136/83 125/72  BMI 39 - 38.11 - 41.13  Some encounter information is confidential and restricted. Go to Review Flowsheets activity to see all data.  Some recent data might be hidden   Plan: 1.  Patient is counseled on appropriate foot care. 2.  BP goal < 130/80. 3.  LDL goal of < 100, HDL > 40 and TG < 150.  4.  Eye Exam yearly and Dental Exam every 6 months. 5.  Dietary recommendations: < 100 g carbohydrates daily. 6.  Physical Activity recommendations: as tolerated. 7.  Pneumovax at diagnosis and once 65+. Wait five years between first dose and dose after 65.  8.  Influenza annually.   Chronic back pain Followed by Dr. Alvester Morin. Review of recent note: Jodi Wolfe is a 47 year old female with chronic pain syndrome and chronic mostly axial low back pain with some referral in the left thigh.  We have completed 2 diagnostic facet/medial branch blocks with 60% relief of her symptoms temporarily each time.  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  She has no focal weakness on exam. Unfortunately from a chronic pain standpoint her case is complicated by depression anxiety and PTSD.  She is on 10 mg of hydrocodone 3 times  a day as well as 600 mg of gabapentin 3 times a day.  At this point I think the next step at least one time is epidural injection to see how much relief she gets of the thigh pain.  If the epidural injection helps her greatly and is more beneficial than the facet joint block than she may have two competing diagnoses.  If she does not get much relief of that with my schedule her for radiofrequency ablation of the L4-5 facet joints and hopefully that will give her longer term relief of up to 60% like the medial branch blocks gave her.  I would think it 60% relief but hopefully she could wean off of the hydrocodone.  Chronic Pain, established problem, stable. 1. Current Medications: per med list. 2. Controlled substance database  reviewed and appropriate: Yes  ROS: No constipation, sedation, falls, or confusion.  Plan: 1. Will continue current Norco dose, increase Gabapentin to 900 mg TID, and add on Lidocaine patches. Three month Rx provided. Will follow Dr. Oscoda Blas plan during this time.  2. Pt warned that strong pain medication has side effects.  Watch for sedation and any alteration in mentation. Pt to stop medication if having inappropriate side effects. Also, all pain medications can have habit forming qualities: patient is aware of this information.  Orders Placed This Encounter  Procedures  . Flu Vaccine QUAD 36+ mos IM  . CBC with Differential/Platelet  . Comprehensive metabolic panel  . Hemoglobin A1c  . Lipid panel   Meds ordered this encounter  Medications  . gabapentin (NEURONTIN) 600 MG tablet    Sig: Take 1.5 tablets (900 mg total) by mouth 3 (three) times daily.    Dispense:  405 tablet    Refill:  0  . HYDROcodone-acetaminophen (NORCO) 10-325 MG tablet    Sig: Take 1 tablet by mouth every 8 (eight) hours as needed for severe pain (Chronic pain).    Dispense:  90 tablet    Refill:  0  . lidocaine (LIDODERM) 5 %    Sig: Place 1 patch onto the skin daily. Remove & Discard  patch within 12 hours or as directed by MD    Dispense:  30 patch    Refill:  0   . Reviewed expectations re: course of current medical issues. . Discussed self-management of symptoms. . Outlined signs and symptoms indicating need for more acute intervention. . Patient verbalized understanding and all questions were answered. Marland Kitchen Health Maintenance issues including appropriate healthy diet, exercise, and smoking avoidance were discussed with patient. . See orders for this visit as documented in the electronic medical record. . Patient received an After Visit Summary.  Helane Rima, DO [place], [facility] 11/10/2018

## 2018-11-09 ENCOUNTER — Ambulatory Visit (INDEPENDENT_AMBULATORY_CARE_PROVIDER_SITE_OTHER): Payer: Medicare Other | Admitting: Family Medicine

## 2018-11-09 ENCOUNTER — Other Ambulatory Visit (HOSPITAL_COMMUNITY): Payer: Self-pay | Admitting: Psychiatry

## 2018-11-09 ENCOUNTER — Encounter: Payer: Self-pay | Admitting: Family Medicine

## 2018-11-09 VITALS — BP 118/68 | HR 94 | Temp 97.4°F | Ht 64.0 in | Wt 227.2 lb

## 2018-11-09 DIAGNOSIS — F4310 Post-traumatic stress disorder, unspecified: Secondary | ICD-10-CM

## 2018-11-09 DIAGNOSIS — E118 Type 2 diabetes mellitus with unspecified complications: Secondary | ICD-10-CM

## 2018-11-09 DIAGNOSIS — F319 Bipolar disorder, unspecified: Secondary | ICD-10-CM | POA: Diagnosis not present

## 2018-11-09 DIAGNOSIS — Z23 Encounter for immunization: Secondary | ICD-10-CM

## 2018-11-09 DIAGNOSIS — Z794 Long term (current) use of insulin: Secondary | ICD-10-CM | POA: Diagnosis not present

## 2018-11-09 DIAGNOSIS — M5442 Lumbago with sciatica, left side: Secondary | ICD-10-CM

## 2018-11-09 DIAGNOSIS — M5432 Sciatica, left side: Secondary | ICD-10-CM

## 2018-11-09 DIAGNOSIS — F3131 Bipolar disorder, current episode depressed, mild: Secondary | ICD-10-CM

## 2018-11-09 DIAGNOSIS — G8929 Other chronic pain: Secondary | ICD-10-CM | POA: Diagnosis not present

## 2018-11-09 MED ORDER — LIDOCAINE 5 % EX PTCH: 1.0000 | MEDICATED_PATCH | CUTANEOUS | 0 refills | Status: DC

## 2018-11-09 MED ORDER — HYDROCODONE-ACETAMINOPHEN 10-325 MG PO TABS: 1.0000 | ORAL_TABLET | Freq: Three times a day (TID) | ORAL | 0 refills | Status: DC | PRN

## 2018-11-09 MED ORDER — GABAPENTIN 600 MG PO TABS: 900.0000 mg | ORAL_TABLET | Freq: Three times a day (TID) | ORAL | 0 refills | Status: DC

## 2018-11-10 ENCOUNTER — Encounter: Payer: Self-pay | Admitting: Family Medicine

## 2018-11-10 ENCOUNTER — Other Ambulatory Visit: Payer: Self-pay | Admitting: Family Medicine

## 2018-11-10 DIAGNOSIS — M549 Dorsalgia, unspecified: Secondary | ICD-10-CM

## 2018-11-10 DIAGNOSIS — G8929 Other chronic pain: Secondary | ICD-10-CM | POA: Insufficient documentation

## 2018-11-10 DIAGNOSIS — F4310 Post-traumatic stress disorder, unspecified: Secondary | ICD-10-CM | POA: Insufficient documentation

## 2018-11-10 DIAGNOSIS — F319 Bipolar disorder, unspecified: Secondary | ICD-10-CM | POA: Insufficient documentation

## 2018-11-10 NOTE — Assessment & Plan Note (Signed)
Medication compliance: compliant all of the time, diabetic diet compliance: noncompliant some of the time, home glucose monitoring: increasing to 200s again, further diabetic ROS: no polyuria or polydipsia, no chest pain, dyspnea or TIA's, no unusual visual symptoms, no medication side effects noted.  Diabetes Health Maintenance Due  Topic Date Due  . OPHTHALMOLOGY EXAM  07/05/2018  . HEMOGLOBIN A1C  01/26/2019  . URINE MICROALBUMIN  04/20/2019  . FOOT EXAM  07/27/2019   Diabetes QM Metrics Latest Ref Rng & Units 07/26/2018 04/19/2018  HbA1c 4.0 - 5.6 % 7.1(A) 10.0  Microalb 0.0 - 1.9 mg/dL - 2.4(H)  Micro/Creat Ratio 0.0 - 30.0 mg/g - 4.7  Some recent data might be hidden   BP& BMI  11/09/2018 08/08/2018 07/26/2018 05/30/2018 04/19/2018  BP 118/68 115/71 124/74 136/83 125/72  BMI 39 - 38.11 - 41.13  Some encounter information is confidential and restricted. Go to Review Flowsheets activity to see all data.  Some recent data might be hidden   Plan: 1.  Patient is counseled on appropriate foot care. 2.  BP goal < 130/80. 3.  LDL goal of < 100, HDL > 40 and TG < 150.  4.  Eye Exam yearly and Dental Exam every 6 months. 5.  Dietary recommendations: < 100 g carbohydrates daily. 6.  Physical Activity recommendations: as tolerated. 7.  Pneumovax at diagnosis and once 65+. Wait five years between first dose and dose after 65.  8.  Influenza annually.

## 2018-11-10 NOTE — Assessment & Plan Note (Signed)
Followed by Dr. Alvester MorinNewton. Review of recent note: [name] is a 47 year old female with chronic pain syndrome and chronic mostly axial low back pain with some referral in the left thigh.  We have completed 2 diagnostic facet/medial branch blocks with 60% relief of her symptoms temporarily each time.  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  She has no focal weakness on exam. Unfortunately from a chronic pain standpoint her case is complicated by depression anxiety and PTSD.  She is on 10 mg of hydrocodone 3 times a day as well as 600 mg of gabapentin 3 times a day.  At this point I think the next step at least one time is epidural injection to see how much relief she gets of the thigh pain.  If the epidural injection helps her greatly and is more beneficial than the facet joint block than she may have two competing diagnoses.  If she does not get much relief of that with my schedule her for radiofrequency ablation of the L4-5 facet joints and hopefully that will give her longer term relief of up to 60% like the medial branch blocks gave her.  I would think it 60% relief but hopefully she could wean off of the hydrocodone.  Chronic Pain, established problem, stable. 1. Current Medications: per med list. 2. Controlled substance database reviewed and appropriate: Yes  ROS: No constipation, sedation, falls, or confusion.  Plan: 1. Will continue current Norco dose, increase Gabapentin to 900 mg TID, and add on Lidocaine patches. Three month Rx provided. Will follow Dr.  BlasNewton's plan during this time.  2. Pt warned that strong pain medication has side effects.  Watch for sedation and any alteration in mentation. Pt to stop medication if having inappropriate side effects. Also, all pain medications can have habit forming qualities: patient is aware of this information.

## 2018-11-11 ENCOUNTER — Telehealth: Payer: Self-pay | Admitting: Family Medicine

## 2018-11-11 NOTE — Telephone Encounter (Signed)
Copied from CRM [phone]. Topic: General - Other >> Nov 11, 2018  9:18 AM Leafy Roobinson, Norma J wrote: Reason for CRM: pt is calling [facility] will not cover lidocaine patches.

## 2018-11-11 NOTE — Telephone Encounter (Signed)
Try OTC 4% lidocaine patches.

## 2018-11-11 NOTE — Telephone Encounter (Signed)
Please see message and advise 

## 2018-11-11 NOTE — Telephone Encounter (Signed)
See note

## 2018-11-14 NOTE — Telephone Encounter (Signed)
Spoke to pt told her since Lidocaine patch was denied by insurance Dr. Earlene PlaterWallace said you can try OTC 4% Lidocaine patches and see if that helps. Pt verbalized understanding.

## 2018-11-16 NOTE — Telephone Encounter (Signed)
I have placed prior auth for patches  [facility] (Key: [id number])   Your information has been submitted to [employer]. [employer] will review the request and will issue a decision, typically within 1-3 days from your submission. You can check the updated outcome later by reopening this request. If [employer] has not responded in 1-3 days or if you have any questions about your ePA request, please contact [employer] at [phone]. If you think there may be a problem with your PA request, use our live chat feature at the bottom right

## 2018-11-16 NOTE — Telephone Encounter (Addendum)
Need to call patient and let know approved.   Jodi Wolfe Key: [id number] - PA Case : [id number]: [id number] - Rx #: [id number] Need help? Call us at [phone]  Outcome  Approvedtoday  Your request has been approved  DrugLidocaine 5% patches  [employer] Electronic PA Form  Original Claim [id number] PA REQUIRED-PRESCRIBER [facility] REQUIRED, MD CONTACT FOR PA.DRUG REQUIRES PRIOR AUTHORIZATION

## 2018-11-17 NOTE — Telephone Encounter (Signed)
Called patient l/m to let know can pick up

## 2018-11-21 ENCOUNTER — Ambulatory Visit (INDEPENDENT_AMBULATORY_CARE_PROVIDER_SITE_OTHER): Payer: Self-pay

## 2018-11-21 ENCOUNTER — Ambulatory Visit (INDEPENDENT_AMBULATORY_CARE_PROVIDER_SITE_OTHER): Payer: Medicare Other | Admitting: Physical Medicine and Rehabilitation

## 2018-11-21 ENCOUNTER — Encounter (INDEPENDENT_AMBULATORY_CARE_PROVIDER_SITE_OTHER): Payer: Self-pay | Admitting: Physical Medicine and Rehabilitation

## 2018-11-21 VITALS — BP 135/70

## 2018-11-21 DIAGNOSIS — G8929 Other chronic pain: Secondary | ICD-10-CM

## 2018-11-21 DIAGNOSIS — M5442 Lumbago with sciatica, left side: Secondary | ICD-10-CM | POA: Diagnosis not present

## 2018-11-21 DIAGNOSIS — M5416 Radiculopathy, lumbar region: Secondary | ICD-10-CM

## 2018-11-21 DIAGNOSIS — G894 Chronic pain syndrome: Secondary | ICD-10-CM | POA: Diagnosis not present

## 2018-11-21 MED ORDER — METHYLPREDNISOLONE ACETATE 80 MG/ML IJ SUSP
80.0000 mg | Freq: Once | INTRAMUSCULAR | Status: AC
  Administered 2018-11-21: 80 mg

## 2018-11-21 NOTE — Procedures (Signed)
Lumbar Epidural Steroid Injection - Interlaminar Approach with Fluoroscopic Guidance  Patient: Jodi Wolfe      Date of Birth: 03-20-1971 MRN: [id number] PCP: Helane RimaWallace, Erica, DO      Visit Date: 11/21/2018   Universal Protocol:     Consent Given By: the patient  Position: PRONE  Additional Comments: Vital signs were monitored before and after the procedure. Patient was prepped and draped in the usual sterile fashion. The correct patient, procedure, and site was verified.   Injection Procedure Details:  Procedure Site One Meds Administered:  Meds ordered this encounter  Medications  . methylPREDNISolone acetate (DEPO-MEDROL) injection 80 mg     Laterality: Left  Location/Site:  L4-L5  Needle size: 20 G  Needle type: Tuohy  Needle Placement: Paramedian epidural  Findings:   -Comments: Excellent flow of contrast into the epidural space.  Procedure Details: Using a paramedian approach from the side mentioned above, the region overlying the inferior lamina was localized under fluoroscopic visualization and the soft tissues overlying this structure were infiltrated with 4 ml. of 1% Lidocaine without Epinephrine. The Tuohy needle was inserted into the epidural space using a paramedian approach.   The epidural space was localized using loss of resistance along with lateral and bi-planar fluoroscopic views.  After negative aspirate for air, blood, and CSF, a 2 ml. volume of Isovue-250 was injected into the epidural space and the flow of contrast was observed. Radiographs were obtained for documentation purposes.    The injectate was administered into the level noted above.   Additional Comments:  The patient tolerated the procedure well Dressing: Band-Aid    Post-procedure details: Patient was observed during the procedure. Post-procedure instructions were reviewed.  Patient left the clinic in stable condition.

## 2018-11-21 NOTE — Progress Notes (Signed)
 .  Numeric Pain Rating Scale and Functional Assessment Average Pain 8   In the last MONTH (on 0-10 scale) has pain interfered with the following?  1. General activity like being  able to carry out your everyday physical activities such as walking, climbing stairs, carrying groceries, or moving a chair?  Rating(6)   +Driver, -BT, -Dye Allergies.  

## 2018-11-21 NOTE — Progress Notes (Signed)
Jodi Wolfe - 47 y.o. female MRN [id number]  Date of birth: [birth year]  Office Visit Note: Visit Date: 11/21/2018 PCP: Helane RimaWallace, Erica, DO Referred by: Helane RimaWallace, Erica, DO  Subjective: Chief Complaint  Patient presents with  . Lower Back - Pain  . Left Leg - Pain   HPI:  Jodi Wolfe is a 47 y.o. female who comes in today For planned repeat lumbar epidural injection at L4-5.  Prior MRI shows significant facet arthropathy fairly advanced at L4-5 with lateral recess narrowing more than canal narrowing without focal nerve compression.  Patient has had chronic pain and has traumatic past with PTSD that contributes to her pain complaints in general.  She is had no new episodes of injury and the injection gave her quite a bit of relief more than 70% relief up until just recently.  Last injection was in August and this was an epidural injection.  She continues to take hydrocodone through Dr. Earlene PlaterWallace however even despite the fact that the injections seem to help.  We will go ahead and repeat this today.  ROS Otherwise per HPI.  Assessment & Plan: Visit Diagnoses:  1. Lumbar radiculopathy   2. Chronic left-sided low back pain with left-sided sciatica   3. Chronic pain syndrome     Plan: No additional findings.   Meds & Orders:  Meds ordered this encounter  Medications  . methylPREDNISolone acetate (DEPO-MEDROL) injection 80 mg    Orders Placed This Encounter  Procedures  . XR C-ARM NO REPORT  . Epidural Steroid injection    Follow-up: Return if symptoms worsen or fail to improve.   Procedures: No procedures performed  Lumbar Epidural Steroid Injection - Interlaminar Approach with Fluoroscopic Guidance  Patient: Jodi Wolfe      Date of Birth: [birth year] MRN: [id number] PCP: Helane RimaWallace, Erica, DO      Visit Date: 11/21/2018   Universal Protocol:     Consent Given By: the patient  Position: PRONE  Additional Comments: Vital signs were monitored before and after the  procedure. Patient was prepped and draped in the usual sterile fashion. The correct patient, procedure, and site was verified.   Injection Procedure Details:  Procedure Site One Meds Administered:  Meds ordered this encounter  Medications  . methylPREDNISolone acetate (DEPO-MEDROL) injection 80 mg     Laterality: Left  Location/Site:  L4-L5  Needle size: 20 G  Needle type: Tuohy  Needle Placement: Paramedian epidural  Findings:   -Comments: Excellent flow of contrast into the epidural space.  Procedure Details: Using a paramedian approach from the side mentioned above, the region overlying the inferior lamina was localized under fluoroscopic visualization and the soft tissues overlying this structure were infiltrated with 4 ml. of 1% Lidocaine without Epinephrine. The Tuohy needle was inserted into the epidural space using a paramedian approach.   The epidural space was localized using loss of resistance along with lateral and bi-planar fluoroscopic views.  After negative aspirate for air, blood, and CSF, a 2 ml. volume of Isovue-250 was injected into the epidural space and the flow of contrast was observed. Radiographs were obtained for documentation purposes.    The injectate was administered into the level noted above.   Additional Comments:  The patient tolerated the procedure well Dressing: Band-Aid    Post-procedure details: Patient was observed during the procedure. Post-procedure instructions were reviewed.  Patient left the clinic in stable condition.   Clinical History: MRI LUMBAR SPINE WITHOUT CONTRAST  TECHNIQUE: Multiplanar, multisequence MR imaging of the  lumbar spine was performed. No intravenous contrast was administered.  COMPARISON:  None.  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 L5-S1 disc is rudimentary.  Alignment: 3 mm anterolisthesis of L4 on L5. Exaggeration of  the normal lumbar lordosis. Trace dextroscoliosis.  Vertebrae: Vertebral body heights maintained without evidence for acute or chronic fracture. Bone marrow signal intensity within normal limits. No discrete or worrisome osseous lesions.  Conus medullaris and cauda equina: Conus extends to the T12-L1 level. Conus and cauda equina appear normal.  Paraspinal and other soft tissues: Paraspinous soft tissues demonstrate no acute abnormality. Scattered parapelvic cysts noted within left kidney. Visualized visceral structures otherwise unremarkable.  Disc levels:  L1-2: Normal interspace. Mild facet and ligamentum flavum hypertrophy. No stenosis.  L2-3: Normal interspace. Mild to moderate facet and ligament flavum hypertrophy. No stenosis.  L3-4: Minimal annular disc bulge with disc desiccation. Mild to moderate facet and ligament flavum hypertrophy. No stenosis.  L4-5: 3 mm anterolisthesis. Disc desiccation without significant disc bulge. Associated severe facet arthrosis. No significant canal or neural foraminal narrowing. No impingement.  L5-S1: Transitional lumbosacral anatomy with rudimentary L5-S1 disc. No stenosis or impingement.  IMPRESSION: 1. 3 mm anterolisthesis of L4 on L5 with associated severe bilateral facet arthropathy. 2. Additional mild to moderate facet arthropathy at L1-2 thru L3-4. 3. No significant canal or neural foraminal stenosis within the lumbar spine. No impingement. 4. 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   Electronically Signed   By: Rise Mu M.D.   On: 12/17/2017 23:22     Objective:  VS:  HT:    WT:   BMI:     BP:135/70  HR: bpm  TEMP: ( )  RESP:  Physical Exam  Ortho Exam Imaging: Xr C-arm No Report  Result Date: 11/21/2018 Please see Notes tab for imaging impression.

## 2018-11-21 NOTE — Patient Instructions (Signed)

## 2018-11-27 ENCOUNTER — Other Ambulatory Visit: Payer: Self-pay | Admitting: Family Medicine

## 2018-11-27 DIAGNOSIS — M5432 Sciatica, left side: Secondary | ICD-10-CM

## 2018-12-05 ENCOUNTER — Encounter (INDEPENDENT_AMBULATORY_CARE_PROVIDER_SITE_OTHER): Payer: Self-pay | Admitting: Physical Medicine and Rehabilitation

## 2018-12-10 ENCOUNTER — Other Ambulatory Visit: Payer: Self-pay | Admitting: Family Medicine

## 2018-12-25 ENCOUNTER — Other Ambulatory Visit: Payer: Self-pay | Admitting: Family Medicine

## 2018-12-25 DIAGNOSIS — Z794 Long term (current) use of insulin: Secondary | ICD-10-CM

## 2018-12-25 DIAGNOSIS — E118 Type 2 diabetes mellitus with unspecified complications: Secondary | ICD-10-CM

## 2018-12-26 ENCOUNTER — Encounter (INDEPENDENT_AMBULATORY_CARE_PROVIDER_SITE_OTHER): Payer: Medicare Other | Admitting: Physical Medicine and Rehabilitation

## 2019-01-03 ENCOUNTER — Encounter: Payer: Self-pay | Admitting: Family Medicine

## 2019-01-04 ENCOUNTER — Telehealth: Payer: Self-pay | Admitting: Family Medicine

## 2019-01-04 NOTE — Telephone Encounter (Signed)
See note

## 2019-01-04 NOTE — Telephone Encounter (Signed)
Pt has open message on this as well. Ok to fill?

## 2019-01-04 NOTE — Telephone Encounter (Signed)
Copied from CRM [phone]. Topic: Quick Communication - Rx Refill/Question >> Jan 04, 2019 10:56 AM Tamela OddiHarris, Symphonie Schneiderman J wrote: Medication: HYDROcodone-acetaminophen (NORCO) 10-325 MG tablet  Patient called to request a refill for the above medication.  Preferred Pharmacy (with phone number or street name): [id number] [place] - [address] [phone] (Phone) [phone] (Fax)

## 2019-01-05 ENCOUNTER — Other Ambulatory Visit: Payer: Self-pay

## 2019-01-05 MED ORDER — HYDROCODONE-ACETAMINOPHEN 10-325 MG PO TABS: 1.0000 | ORAL_TABLET | Freq: Three times a day (TID) | ORAL | 0 refills | Status: DC | PRN

## 2019-01-05 NOTE — Telephone Encounter (Signed)
Completed via phone note.

## 2019-01-05 NOTE — Telephone Encounter (Signed)
See note

## 2019-01-05 NOTE — Telephone Encounter (Signed)
Pharm does not have the hydrocodone. Please resend

## 2019-01-05 NOTE — Telephone Encounter (Signed)
Patient calling and states that she was told this medication was sent to  the pharmacy. Patient called the pharmacy and states that they have not received this medication refill. Please advise.

## 2019-01-05 NOTE — Telephone Encounter (Signed)
Called patient let her know that script did not go when Dr. Earlene Plater sent today. I have printed script that she can come by and pick up at our office she will do that.

## 2019-01-08 ENCOUNTER — Other Ambulatory Visit (HOSPITAL_COMMUNITY): Payer: Self-pay | Admitting: Psychiatry

## 2019-01-08 DIAGNOSIS — F3131 Bipolar disorder, current episode depressed, mild: Secondary | ICD-10-CM

## 2019-01-09 ENCOUNTER — Other Ambulatory Visit: Payer: Self-pay | Admitting: Family Medicine

## 2019-01-11 ENCOUNTER — Other Ambulatory Visit (HOSPITAL_COMMUNITY): Payer: Self-pay

## 2019-01-11 DIAGNOSIS — F3131 Bipolar disorder, current episode depressed, mild: Secondary | ICD-10-CM

## 2019-01-11 MED ORDER — CHLORPROMAZINE HCL 50 MG PO TABS: 50.0000 mg | ORAL_TABLET | Freq: Every day | ORAL | 0 refills | Status: DC

## 2019-01-15 ENCOUNTER — Other Ambulatory Visit: Payer: Self-pay | Admitting: Family Medicine

## 2019-01-15 DIAGNOSIS — Z794 Long term (current) use of insulin: Secondary | ICD-10-CM

## 2019-01-15 DIAGNOSIS — E118 Type 2 diabetes mellitus with unspecified complications: Secondary | ICD-10-CM

## 2019-01-16 ENCOUNTER — Encounter (HOSPITAL_COMMUNITY): Payer: Self-pay | Admitting: Psychiatry

## 2019-01-16 ENCOUNTER — Ambulatory Visit (INDEPENDENT_AMBULATORY_CARE_PROVIDER_SITE_OTHER): Payer: Medicare Other | Admitting: Psychiatry

## 2019-01-16 DIAGNOSIS — F4310 Post-traumatic stress disorder, unspecified: Secondary | ICD-10-CM | POA: Diagnosis not present

## 2019-01-16 DIAGNOSIS — F3131 Bipolar disorder, current episode depressed, mild: Secondary | ICD-10-CM | POA: Diagnosis not present

## 2019-01-16 DIAGNOSIS — F410 Panic disorder [episodic paroxysmal anxiety] without agoraphobia: Secondary | ICD-10-CM | POA: Diagnosis not present

## 2019-01-16 MED ORDER — CHLORPROMAZINE HCL 50 MG PO TABS: 50.0000 mg | ORAL_TABLET | Freq: Every day | ORAL | 0 refills | Status: DC

## 2019-01-16 MED ORDER — LAMOTRIGINE 200 MG PO TABS: 200.0000 mg | ORAL_TABLET | Freq: Every day | ORAL | 0 refills | Status: DC

## 2019-01-16 MED ORDER — CLONAZEPAM 0.5 MG PO TABS: 0.5000 mg | ORAL_TABLET | Freq: Three times a day (TID) | ORAL | 2 refills | Status: DC | PRN
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
## 2019-01-23 ENCOUNTER — Encounter (INDEPENDENT_AMBULATORY_CARE_PROVIDER_SITE_OTHER): Payer: Self-pay | Admitting: Physical Medicine and Rehabilitation

## 2019-01-23 ENCOUNTER — Ambulatory Visit (INDEPENDENT_AMBULATORY_CARE_PROVIDER_SITE_OTHER): Payer: Self-pay

## 2019-01-23 ENCOUNTER — Ambulatory Visit (INDEPENDENT_AMBULATORY_CARE_PROVIDER_SITE_OTHER): Payer: Medicare Other | Admitting: Physical Medicine and Rehabilitation

## 2019-01-23 VITALS — BP 124/65 | HR 77 | Temp 97.5°F

## 2019-01-23 DIAGNOSIS — G8929 Other chronic pain: Secondary | ICD-10-CM

## 2019-01-23 DIAGNOSIS — M5416 Radiculopathy, lumbar region: Secondary | ICD-10-CM | POA: Diagnosis not present

## 2019-01-23 DIAGNOSIS — M5442 Lumbago with sciatica, left side: Secondary | ICD-10-CM | POA: Diagnosis not present

## 2019-01-23 DIAGNOSIS — M4316 Spondylolisthesis, lumbar region: Secondary | ICD-10-CM

## 2019-01-23 DIAGNOSIS — Z6841 Body Mass Index (BMI) 40.0 and over, adult: Secondary | ICD-10-CM

## 2019-01-23 MED ORDER — METHYLPREDNISOLONE ACETATE 80 MG/ML IJ SUSP
80.0000 mg | Freq: Once | INTRAMUSCULAR | Status: AC
  Administered 2019-01-23: 80 mg

## 2019-01-23 NOTE — Progress Notes (Signed)
 .  Numeric Pain Rating Scale and Functional Assessment Average Pain 8   In the last MONTH (on 0-10 scale) has pain interfered with the following?  1. General activity like being  able to carry out your everyday physical activities such as walking, climbing stairs, carrying groceries, or moving a chair?  Rating(7)   +Driver, -BT, -Dye Allergies.  

## 2019-01-23 NOTE — Patient Instructions (Signed)

## 2019-01-28 ENCOUNTER — Other Ambulatory Visit: Payer: Self-pay | Admitting: Family Medicine

## 2019-01-28 DIAGNOSIS — Z794 Long term (current) use of insulin: Secondary | ICD-10-CM

## 2019-01-28 DIAGNOSIS — E118 Type 2 diabetes mellitus with unspecified complications: Secondary | ICD-10-CM

## 2019-01-30 ENCOUNTER — Encounter: Payer: Self-pay | Admitting: Family Medicine

## 2019-02-01 MED ORDER — HYDROCODONE-ACETAMINOPHEN 10-325 MG PO TABS: 1.0000 | ORAL_TABLET | Freq: Three times a day (TID) | ORAL | 0 refills | Status: DC | PRN

## 2019-02-05 ENCOUNTER — Other Ambulatory Visit: Payer: Self-pay | Admitting: Family Medicine

## 2019-02-08 ENCOUNTER — Other Ambulatory Visit: Payer: Self-pay | Admitting: Family Medicine

## 2019-02-08 DIAGNOSIS — M5432 Sciatica, left side: Secondary | ICD-10-CM

## 2019-02-12 ENCOUNTER — Other Ambulatory Visit (HOSPITAL_COMMUNITY): Payer: Self-pay | Admitting: Family Medicine

## 2019-02-12 DIAGNOSIS — F3131 Bipolar disorder, current episode depressed, mild: Secondary | ICD-10-CM

## 2019-02-12 NOTE — Progress Notes (Signed)
Jodi Wolfe is a 48 y.o. female is here for follow up.  History of Present Illness:   HPI: Medication compliance: compliant all of the time, diabetic diet compliance: noncompliant much of the time, home glucose monitoring: is performed sporadically, further diabetic ROS: no polyuria or polydipsia, no chest pain, dyspnea or TIA's.  Review of Systems  Constitutional: Positive for malaise/fatigue. Negative for chills, fever and weight loss.  Respiratory: Negative for cough, shortness of breath and wheezing.   Cardiovascular: Negative for chest pain, palpitations and leg swelling.  Gastrointestinal: Positive for heartburn. Negative for constipation, diarrhea, nausea and vomiting.  Genitourinary: Negative for dysuria and urgency.  Musculoskeletal: Positive for back pain, joint pain and myalgias. Negative for falls.  Skin: Negative for rash.  Neurological: Negative for dizziness and headaches.  Psychiatric/Behavioral: Negative for depression, substance abuse and suicidal ideas. The patient is not nervous/anxious.    Health Maintenance Due  Topic Date Due  . OPHTHALMOLOGY EXAM  07/05/2018   Depression screen [facility] 2/9 02/13/2019 04/19/2017  Decreased Interest 0 0  Down, Depressed, Hopeless 0 0  PHQ - 2 Score 0 0  Altered sleeping 0 -  Tired, decreased energy 1 -  Change in appetite 0 -  Feeling bad or failure about yourself  0 -  Trouble concentrating 0 -  Moving slowly or fidgety/restless 0 -  Suicidal thoughts 0 -  PHQ-9 Score 1 -  Difficult doing work/chores Not difficult at all -   PMHx, SurgHx, SocialHx, FamHx, Medications, and Allergies were reviewed in the Visit Navigator and updated as appropriate.   Patient Active Problem List   Diagnosis Date Noted  . Chronic midline low back pain with left-sided sciatica 02/16/2019  . Dyspepsia 02/16/2019  . Hyperlipidemia associated with type 2 diabetes mellitus (HCC) 02/16/2019  . Lumbar radiculopathy 11/21/2018  . Chronic back pain  11/10/2018  . PTSD (post-traumatic stress disorder) 11/10/2018  . Bipolar depression (HCC), followed by Psychiatry 11/10/2018  . Sciatica of left side 12/02/2017  . Class 3 severe obesity due to excess calories with serious comorbidity and body mass index (BMI) of 40.0 to 44.9 in adult (HCC) 04/19/2017  . GAD (generalized anxiety disorder) 04/19/2017  . Psychophysiological insomnia 04/19/2017  . Type 2 diabetes mellitus with complication, with long-term current use of insulin (HCC) 04/19/2017   Social History   Tobacco Use  . Smoking status: Never Smoker  . Smokeless tobacco: Never Used  Substance Use Topics  . Alcohol use: No  . Drug use: No   Current Medications and Allergies   .  atorvastatin (LIPITOR) 10 MG tablet, TAKE 1 TABLET (10 MG TOTAL) DAILY BY MOUTH., Disp: 90 tablet, Rfl: 1 .  chlorproMAZINE (THORAZINE) 50 MG tablet, Take 1 tablet (50 mg total) by mouth at bedtime., Disp: 90 tablet, Rfl: 0 .  clonazePAM (KLONOPIN) 0.5 MG tablet, Take 1 tablet (0.5 mg total) by mouth 3 (three) times daily as needed for anxiety., Disp: 90 tablet, Rfl: 2 .  esomeprazole (NEXIUM) 40 MG capsule, TAKE 1 CAPSULE BY MOUTH EVERY DAY, Disp: 90 capsule, Rfl: 1 .  gabapentin (NEURONTIN) 600 MG tablet, TAKE 1.5 TABLETS (900 MG TOTAL) BY MOUTH 3 (THREE) TIMES DAILY., Disp: 405 tablet, Rfl: 0 .  HYDROcodone-acetaminophen (NORCO) 10-325 MG tablet, Take 1 tablet by mouth every 8 (eight) hours as needed for severe pain (Chronic pain)., Disp: 90 tablet, Rfl: 0 .  lamoTRIgine (LAMICTAL) 200 MG tablet, Take 1 tablet (200 mg total) by mouth daily., Disp: 90 tablet, Rfl:  0 .  LEVEMIR FLEXTOUCH 100 UNIT/ML Pen, INJECT 20 UNITS UNDER THE SKIN DAILY AT 10PM (Patient taking differently: 26UNITS UNDER THE SKIN DAILY AT 10PM), Disp: 3 mL, Rfl: 5 .  lidocaine (LIDODERM) 5 %, PLACE 1 PATCH ONTO THE SKIN DAILY. REMOVE & DISCARD PATCH WITHIN 12 HOURS OR AS DIRECTED BY MD, Disp: 30 patch, Rfl: 0 .  naproxen (NAPROSYN) 500 MG  tablet, TAKE 0.5 TABLETS (250 MG TOTAL) BY MOUTH 2 (TWO) TIMES DAILY WITH A MEAL., Disp: 90 tablet, Rfl: 3 .  ondansetron (ZOFRAN-ODT) 4 MG disintegrating tablet, TAKE 1 TABLET BY MOUTH EVERY 8 HOURS AS NEEDED FOR NAUSEA AND VOMITING, Disp: 20 tablet, Rfl: 0 .  OZEMPIC, 1 MG/DOSE, 2 MG/1.5ML SOPN, INJECT 1 MG INTO THE SKIN ONCE A WEEK., Disp: 3 pen, Rfl: 6   Allergies  Allergen Reactions  . Morphine And Related   . Sulfa Antibiotics    Review of Systems   Pertinent items are noted in the HPI. Otherwise, a complete ROS is negative.  Vitals   Vitals:   02/13/19 1549  BP: 122/62  Pulse: 91  Temp: 98 F (36.7 C)  TempSrc: Oral  SpO2: 96%  Weight: 223 lb 3.2 oz (101.2 kg)  Height: 5\' 4"  (1.626 m)     Body mass index is 38.31 kg/m.  Physical Exam   Physical Exam Vitals signs and nursing note reviewed.  HENT:     Head: Normocephalic and atraumatic.     Nose: Nose normal.     Mouth/Throat:     Mouth: Mucous membranes are moist.  Eyes:     Pupils: Pupils are equal, round, and reactive to light.  Neck:     Musculoskeletal: Normal range of motion and neck supple.  Cardiovascular:     Rate and Rhythm: Normal rate and regular rhythm.     Heart sounds: Normal heart sounds.  Pulmonary:     Effort: Pulmonary effort is normal.  Abdominal:     Palpations: Abdomen is soft.  Skin:    General: Skin is warm.  Psychiatric:        Behavior: Behavior normal.     Results for orders placed or performed in visit on 02/13/19  Lipid panel  Result Value Ref Range   Cholesterol 163 0 - 200 mg/dL   Triglycerides 161.0143.0 0.0 - 149.0 mg/dL   HDL 96.0460.30 >54.09>39.00 mg/dL   VLDL 81.128.6 0.0 - 91.440.0 mg/dL   LDL Cholesterol 74 0 - 99 mg/dL   Total CHOL/HDL Ratio 3    NonHDL 102.52   Hemoglobin A1c  Result Value Ref Range   Hgb A1c MFr Bld 7.7 (H) 4.6 - 6.5 %  Comprehensive metabolic panel  Result Value Ref Range   Sodium 137 135 - 145 mEq/L   Potassium 4.7 3.5 - 5.1 mEq/L   Chloride 97 96 -  112 mEq/L   CO2 31 19 - 32 mEq/L   Glucose, Bld 138 (H) 70 - 99 mg/dL   BUN 8 6 - 23 mg/dL   Creatinine, Ser 7.820.57 0.40 - 1.20 mg/dL   Total Bilirubin 0.4 0.2 - 1.2 mg/dL   Alkaline Phosphatase 102 39 - 117 U/L   AST 12 0 - 37 U/L   ALT 16 0 - 35 U/L   Total Protein 7.3 6.0 - 8.3 g/dL   Albumin 4.4 3.5 - 5.2 g/dL   Calcium 9.7 8.4 - 95.610.5 mg/dL   GFR 213.08113.13 >65.78>60.00 mL/min  CBC with Differential/Platelet  Result Value Ref  Range   WBC 7.5 4.0 - 10.5 K/uL   RBC 4.55 3.87 - 5.11 Mil/uL   Hemoglobin 12.7 12.0 - 15.0 g/dL   HCT 26.2 03.5 - 59.7 %   MCV 85.7 78.0 - 100.0 fl   MCHC 32.6 30.0 - 36.0 g/dL   RDW 41.6 (H) 38.4 - 53.6 %   Platelets 210.0 150.0 - 400.0 K/uL   Neutrophils Relative % 59.1 43.0 - 77.0 %   Lymphocytes Relative 33.8 12.0 - 46.0 %   Monocytes Relative 4.8 3.0 - 12.0 %   Eosinophils Relative 1.0 0.0 - 5.0 %   Basophils Relative 1.3 0.0 - 3.0 %   Neutro Abs 4.4 1.4 - 7.7 K/uL   Lymphs Abs 2.5 0.7 - 4.0 K/uL   Monocytes Absolute 0.4 0.1 - 1.0 K/uL   Eosinophils Absolute 0.1 0.0 - 0.7 K/uL   Basophils Absolute 0.1 0.0 - 0.1 K/uL   Assessment and Plan   Aarion was seen today for follow-up.  Diagnoses and all orders for this visit:  Type 2 diabetes mellitus with complication, with long-term current use of insulin (HCC) -     Hemoglobin A1c -     Comprehensive metabolic panel -     CBC with Differential/Platelet  Bipolar depression (HCC), followed by Psychiatry Comments: Stable.   Class 3 severe obesity due to excess calories with serious comorbidity and body mass index (BMI) of 40.0 to 44.9 in adult [facility])  Chronic midline low back pain with left-sided sciatica Comments: Improved.   Dyspepsia Comments: Worse. See orders.  Orders: -     dexlansoprazole (DEXILANT) 60 MG capsule; Take 1 capsule (60 mg total) by mouth daily.  Hyperlipidemia associated with type 2 diabetes mellitus (HCC) Comments: Stable. Orders: -     Lipid panel -     Comprehensive  metabolic panel    . Orders and follow up as documented in EpicCare, reviewed diet, exercise and weight control, cardiovascular risk and specific lipid/LDL goals reviewed, reviewed medications and side effects in detail.  . Reviewed expectations re: course of current medical issues. . Outlined signs and symptoms indicating need for more acute intervention. . Patient verbalized understanding and all questions were answered. . Patient received an After Visit Summary.  Helane Rima, DO [place] 02/16/2019

## 2019-02-13 ENCOUNTER — Ambulatory Visit (INDEPENDENT_AMBULATORY_CARE_PROVIDER_SITE_OTHER): Payer: Medicare Other | Admitting: Family Medicine

## 2019-02-13 ENCOUNTER — Encounter: Payer: Self-pay | Admitting: Family Medicine

## 2019-02-13 VITALS — BP 122/62 | HR 91 | Temp 98.0°F | Ht 64.0 in | Wt 223.2 lb

## 2019-02-13 DIAGNOSIS — Z6841 Body Mass Index (BMI) 40.0 and over, adult: Secondary | ICD-10-CM | POA: Diagnosis not present

## 2019-02-13 DIAGNOSIS — R1013 Epigastric pain: Secondary | ICD-10-CM | POA: Diagnosis not present

## 2019-02-13 DIAGNOSIS — Z794 Long term (current) use of insulin: Secondary | ICD-10-CM | POA: Diagnosis not present

## 2019-02-13 DIAGNOSIS — E1169 Type 2 diabetes mellitus with other specified complication: Secondary | ICD-10-CM

## 2019-02-13 DIAGNOSIS — M5442 Lumbago with sciatica, left side: Secondary | ICD-10-CM | POA: Diagnosis not present

## 2019-02-13 DIAGNOSIS — F319 Bipolar disorder, unspecified: Secondary | ICD-10-CM | POA: Diagnosis not present

## 2019-02-13 DIAGNOSIS — E785 Hyperlipidemia, unspecified: Secondary | ICD-10-CM | POA: Diagnosis not present

## 2019-02-13 DIAGNOSIS — G8929 Other chronic pain: Secondary | ICD-10-CM | POA: Diagnosis not present

## 2019-02-13 DIAGNOSIS — E118 Type 2 diabetes mellitus with unspecified complications: Secondary | ICD-10-CM | POA: Diagnosis not present

## 2019-02-13 MED ORDER — DEXLANSOPRAZOLE 60 MG PO CPDR: 60.0000 mg | DELAYED_RELEASE_CAPSULE | Freq: Every day | ORAL | 6 refills | Status: DC

## 2019-02-14 LAB — COMPREHENSIVE METABOLIC PANEL
ALT: 16 U/L (ref 0–35)
AST: 12 U/L (ref 0–37)
Albumin: 4.4 g/dL (ref 3.5–5.2)
Alkaline Phosphatase: 102 U/L (ref 39–117)
BUN: 8 mg/dL (ref 6–23)
CO2: 31 mEq/L (ref 19–32)
Calcium: 9.7 mg/dL (ref 8.4–10.5)
Chloride: 97 mEq/L (ref 96–112)
Creatinine, Ser: 0.57 mg/dL (ref 0.40–1.20)
GFR: 113.13 mL/min (ref >60.00)
Glucose, Bld: 138 mg/dL — ABNORMAL HIGH (ref 70–99)
Potassium: 4.7 mEq/L (ref 3.5–5.1)
Sodium: 137 mEq/L (ref 135–145)
Total Bilirubin: 0.4 mg/dL (ref 0.2–1.2)
Total Protein: 7.3 g/dL (ref 6.0–8.3)

## 2019-02-14 LAB — CBC WITH DIFFERENTIAL/PLATELET
Basophils Absolute: 0.1 10*3/uL (ref 0.0–0.1)
Basophils Relative: 1.3 % (ref 0.0–3.0)
Eosinophils Absolute: 0.1 10*3/uL (ref 0.0–0.7)
Eosinophils Relative: 1 % (ref 0.0–5.0)
HCT: 39 % (ref 36.0–46.0)
Hemoglobin: 12.7 g/dL (ref 12.0–15.0)
Lymphocytes Relative: 33.8 % (ref 12.0–46.0)
Lymphs Abs: 2.5 10*3/uL (ref 0.7–4.0)
MCHC: 32.6 g/dL (ref 30.0–36.0)
MCV: 85.7 fl (ref 78.0–100.0)
Monocytes Absolute: 0.4 10*3/uL (ref 0.1–1.0)
Monocytes Relative: 4.8 % (ref 3.0–12.0)
Neutro Abs: 4.4 10*3/uL (ref 1.4–7.7)
Neutrophils Relative %: 59.1 % (ref 43.0–77.0)
Platelets: 210 10*3/uL (ref 150.0–400.0)
RBC: 4.55 Mil/uL (ref 3.87–5.11)
RDW: 15.7 % — ABNORMAL HIGH (ref 11.5–15.5)
WBC: 7.5 10*3/uL (ref 4.0–10.5)

## 2019-02-14 LAB — LIPID PANEL
Cholesterol: 163 mg/dL (ref 0–200)
HDL: 60.3 mg/dL (ref >39.00)
LDL Cholesterol: 74 mg/dL (ref 0–99)
NonHDL: 102.52
Total CHOL/HDL Ratio: 3
Triglycerides: 143 mg/dL (ref 0.0–149.0)
VLDL: 28.6 mg/dL (ref 0.0–40.0)

## 2019-02-14 LAB — HEMOGLOBIN A1C: Hgb A1c MFr Bld: 7.7 % — ABNORMAL HIGH (ref 4.6–6.5)

## 2019-02-16 DIAGNOSIS — G8929 Other chronic pain: Secondary | ICD-10-CM | POA: Insufficient documentation

## 2019-02-16 DIAGNOSIS — R1013 Epigastric pain: Secondary | ICD-10-CM | POA: Insufficient documentation

## 2019-02-16 DIAGNOSIS — E1169 Type 2 diabetes mellitus with other specified complication: Secondary | ICD-10-CM | POA: Insufficient documentation

## 2019-02-16 DIAGNOSIS — E785 Hyperlipidemia, unspecified: Secondary | ICD-10-CM

## 2019-02-16 DIAGNOSIS — M5442 Lumbago with sciatica, left side: Secondary | ICD-10-CM

## 2019-02-17 ENCOUNTER — Other Ambulatory Visit: Payer: Self-pay | Admitting: Family Medicine

## 2019-02-17 DIAGNOSIS — E118 Type 2 diabetes mellitus with unspecified complications: Secondary | ICD-10-CM

## 2019-02-17 DIAGNOSIS — Z794 Long term (current) use of insulin: Secondary | ICD-10-CM

## 2019-02-21 ENCOUNTER — Other Ambulatory Visit: Payer: Self-pay | Admitting: Family Medicine

## 2019-02-21 NOTE — Telephone Encounter (Signed)
Copied from CRM [id number]. Topic: Quick Communication - Rx Refill/Question >> Feb 21, 2019  2:54 PM Elliot Gault wrote:  Medication: HYDROcodone-acetaminophen (NORCO) 10-325 MG tablet   Has the patient contacted their pharmacy? No, patient was advised at last follow up 02/13/2019 Rx would auto fill,patient will run out on  02/26/2019 and wanted to call in advance    Preferred Pharmacy (with phone number or street name):  [id number] [place], [place] - [address] [phone] (Phone) [phone] (Fax)    Agent: Please be advised that RX refills may take up to 3 business days. We ask that you follow-up with your pharmacy.

## 2019-02-22 NOTE — Telephone Encounter (Signed)
Patient calling and states that it was her that requested this medication. State that she does it early because she is aware of the refill policy and that it could take up to 72 hours for approval. Did not want to run out of medication while waiting on approval. Please advise.

## 2019-02-22 NOTE — Telephone Encounter (Signed)
Filled on 2/5. Call patient to see if she requested.

## 2019-02-23 NOTE — Telephone Encounter (Signed)
Please tee up 2 new scripts to be filled on appropriate dates and I will send.

## 2019-02-23 NOTE — Telephone Encounter (Signed)
See note

## 2019-02-24 NOTE — Telephone Encounter (Signed)
Patient is calling to check on her medication refill 02/26/19 is on Sunday.   Thanks

## 2019-02-24 NOTE — Telephone Encounter (Signed)
See note

## 2019-02-25 MED ORDER — HYDROCODONE-ACETAMINOPHEN 10-325 MG PO TABS: 1.0000 | ORAL_TABLET | Freq: Three times a day (TID) | ORAL | 0 refills | Status: DC | PRN

## 2019-02-25 NOTE — Telephone Encounter (Signed)
I have Rx's ready for you.

## 2019-03-04 ENCOUNTER — Other Ambulatory Visit: Payer: Self-pay | Admitting: Family Medicine

## 2019-03-07 ENCOUNTER — Other Ambulatory Visit: Payer: Self-pay | Admitting: Family Medicine

## 2019-03-07 DIAGNOSIS — K219 Gastro-esophageal reflux disease without esophagitis: Secondary | ICD-10-CM

## 2019-03-16 ENCOUNTER — Other Ambulatory Visit: Payer: Self-pay | Admitting: Family Medicine

## 2019-03-16 DIAGNOSIS — E118 Type 2 diabetes mellitus with unspecified complications: Secondary | ICD-10-CM

## 2019-03-16 DIAGNOSIS — Z794 Long term (current) use of insulin: Secondary | ICD-10-CM

## 2019-03-23 NOTE — Progress Notes (Signed)
Jodi Wolfe - 48 y.o. female MRN [id number]  Date of birth: 01/24/1971  Office Visit Note: Visit Date: 01/23/2019 PCP: Helane Rima, DO Referred by: Helane Rima, DO  Subjective: Chief Complaint  Patient presents with  . Lower Back - Pain   HPI: Jodi Wolfe is a 48 y.o. female who comes in today For reevaluation management and potential epidural injection.  Last injection performed in November seem to help for about a month.  Epidural injection has helped more than facet joint block.  MRI findings mainly show severe arthritis at L4-5 with very small listhesis.  There is no central canal but probable lateral recess narrowing.  She has transitional L5 segment which shows a exaggerated lordosis but is really not a big issue.  We will go ahead and repeat the injection today.  She is on opioid treatment through her primary care physician Dr. Helane Rima.  She is someone who probably would do well with lumbar fusion at L4-5.  Normally anti-surgery but for 1 level fusion depending on her other socio-economic issues and medical conditions could be an option.  She has type II diabetic and overweight.  We did stress exercise and weight loss.  ROS Otherwise per HPI.  Assessment & Plan: Visit Diagnoses:  1. Lumbar radiculopathy   2. Chronic left-sided low back pain with left-sided sciatica   3. Spondylolisthesis of lumbar region   4. Class 3 severe obesity due to excess calories with serious comorbidity and body mass index (BMI) of 40.0 to 44.9 in adult [facility])     Plan: No additional findings.   Meds & Orders:  Meds ordered this encounter  Medications  . methylPREDNISolone acetate (DEPO-MEDROL) injection 80 mg    Orders Placed This Encounter  Procedures  . XR C-ARM NO REPORT  . Epidural Steroid injection    Follow-up: Return if symptoms worsen or fail to improve.   Procedures: No procedures performed  Lumbar Epidural Steroid Injection - Interlaminar Approach with Fluoroscopic  Guidance  Patient: Jodi Wolfe      Date of Birth: 1971-11-05 MRN: [id number] PCP: Helane Rima, DO      Visit Date: 01/23/2019   Universal Protocol:     Consent Given By: the patient  Position: PRONE  Additional Comments: Vital signs were monitored before and after the procedure. Patient was prepped and draped in the usual sterile fashion. The correct patient, procedure, and site was verified.   Injection Procedure Details:  Procedure Site One Meds Administered:  Meds ordered this encounter  Medications  . methylPREDNISolone acetate (DEPO-MEDROL) injection 80 mg     Laterality: Left  Location/Site:  L4-L5  Needle size: 20 G  Needle type: Tuohy  Needle Placement: Paramedian epidural  Findings:   -Comments: Excellent flow of contrast into the epidural space.  Procedure Details: Using a paramedian approach from the side mentioned above, the region overlying the inferior lamina was localized under fluoroscopic visualization and the soft tissues overlying this structure were infiltrated with 4 ml. of 1% Lidocaine without Epinephrine. The Tuohy needle was inserted into the epidural space using a paramedian approach.   The epidural space was localized using loss of resistance along with lateral and bi-planar fluoroscopic views.  After negative aspirate for air, blood, and CSF, a 2 ml. volume of Isovue-250 was injected into the epidural space and the flow of contrast was observed. Radiographs were obtained for documentation purposes.    The injectate was administered into the level noted above.   Additional Comments:  The patient tolerated the procedure well Dressing: 2 x 2 sterile gauze and Band-Aid    Post-procedure details: Patient was observed during the procedure. Post-procedure instructions were reviewed.  Patient left the clinic in stable condition.   Clinical History: MRI LUMBAR SPINE WITHOUT CONTRAST  TECHNIQUE: Multiplanar, multisequence MR  imaging of the lumbar spine was performed. No intravenous contrast was administered.  COMPARISON:  None.  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 L5-S1 disc is rudimentary.  Alignment: 3 mm anterolisthesis of L4 on L5. Exaggeration of the normal lumbar lordosis. Trace dextroscoliosis.  Vertebrae: Vertebral body heights maintained without evidence for acute or chronic fracture. Bone marrow signal intensity within normal limits. No discrete or worrisome osseous lesions.  Conus medullaris and cauda equina: Conus extends to the T12-L1 level. Conus and cauda equina appear normal.  Paraspinal and other soft tissues: Paraspinous soft tissues demonstrate no acute abnormality. Scattered parapelvic cysts noted within left kidney. Visualized visceral structures otherwise unremarkable.  Disc levels:  L1-2: Normal interspace. Mild facet and ligamentum flavum hypertrophy. No stenosis.  L2-3: Normal interspace. Mild to moderate facet and ligament flavum hypertrophy. No stenosis.  L3-4: Minimal annular disc bulge with disc desiccation. Mild to moderate facet and ligament flavum hypertrophy. No stenosis.  L4-5: 3 mm anterolisthesis. Disc desiccation without significant disc bulge. Associated severe facet arthrosis. No significant canal or neural foraminal narrowing. No impingement.  L5-S1: Transitional lumbosacral anatomy with rudimentary L5-S1 disc. No stenosis or impingement.  IMPRESSION: 1. 3 mm anterolisthesis of L4 on L5 with associated severe bilateral facet arthropathy. 2. Additional mild to moderate facet arthropathy at L1-2 thru L3-4. 3. No significant canal or neural foraminal stenosis within the lumbar spine. No impingement. 4. 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   Electronically Signed   By: Rise Mu M.D.   On: 12/17/2017 23:22   She  reports that she has never smoked. She has never used smokeless tobacco.  Recent Labs    04/19/18 1658 07/26/18 1513 02/13/19 1624  HGBA1C 10.0 7.1* 7.7*    Objective:  VS:  HT:    WT:   BMI:     BP:124/65  HR:77bpm  TEMP:(!) 97.5 F (36.4 C)(Oral)  RESP:  Physical Exam  Ortho Exam Imaging: No results found.  Past Medical/Family/Surgical/Social History: Medications & Allergies reviewed per EMR, new medications updated. Patient Active Problem List   Diagnosis Date Noted  . Chronic midline low back pain with left-sided sciatica 02/16/2019  . Dyspepsia 02/16/2019  . Hyperlipidemia associated with type 2 diabetes mellitus (HCC) 02/16/2019  . Lumbar radiculopathy 11/21/2018  . Chronic back pain 11/10/2018  . PTSD (post-traumatic stress disorder) 11/10/2018  . Bipolar depression (HCC), followed by Psychiatry 11/10/2018  . Sciatica of left side 12/02/2017  . Class 3 severe obesity due to excess calories with serious comorbidity and body mass index (BMI) of 40.0 to 44.9 in adult (HCC) 04/19/2017  . GAD (generalized anxiety disorder) 04/19/2017  . Psychophysiological insomnia 04/19/2017  . Type 2 diabetes mellitus with complication, with long-term current use of insulin (HCC) 04/19/2017   Past Medical History:  Diagnosis Date  . Anxiety   . Depression   . Diabetes mellitus without complication (HCC)   . GERD (gastroesophageal reflux disease)   . Headache   . Neuropathy    Family History  Problem Relation Age of Onset  . Hyperlipidemia Mother   . Hypertension Mother   . Stroke Mother   . Depression  Mother   . Drug abuse Mother   . Hyperlipidemia Father   . Hypertension Father   . Stroke Father   . Bipolar disorder Father   . Hyperlipidemia Brother   . Hypertension Brother   . Anxiety disorder Brother    Past Surgical History:  Procedure Laterality Date  . APPENDECTOMY    . CHOLECYSTECTOMY    . TONSILLECTOMY AND ADENOIDECTOMY     Social History    Occupational History  . Not on file  Tobacco Use  . Smoking status: Never Smoker  . Smokeless tobacco: Never Used  Substance and Sexual Activity  . Alcohol use: No  . Drug use: No  . Sexual activity: Not Currently    Birth control/protection: None

## 2019-03-23 NOTE — Procedures (Signed)
Lumbar Epidural Steroid Injection - Interlaminar Approach with Fluoroscopic Guidance  Patient: Jodi Wolfe      Date of Birth: November 14, 1971 MRN: [id number] PCP: Helane Rima, DO      Visit Date: 01/23/2019   Universal Protocol:     Consent Given By: the patient  Position: PRONE  Additional Comments: Vital signs were monitored before and after the procedure. Patient was prepped and draped in the usual sterile fashion. The correct patient, procedure, and site was verified.   Injection Procedure Details:  Procedure Site One Meds Administered:  Meds ordered this encounter  Medications  . methylPREDNISolone acetate (DEPO-MEDROL) injection 80 mg     Laterality: Left  Location/Site:  L4-L5  Needle size: 20 G  Needle type: Tuohy  Needle Placement: Paramedian epidural  Findings:   -Comments: Excellent flow of contrast into the epidural space.  Procedure Details: Using a paramedian approach from the side mentioned above, the region overlying the inferior lamina was localized under fluoroscopic visualization and the soft tissues overlying this structure were infiltrated with 4 ml. of 1% Lidocaine without Epinephrine. The Tuohy needle was inserted into the epidural space using a paramedian approach.   The epidural space was localized using loss of resistance along with lateral and bi-planar fluoroscopic views.  After negative aspirate for air, blood, and CSF, a 2 ml. volume of Isovue-250 was injected into the epidural space and the flow of contrast was observed. Radiographs were obtained for documentation purposes.    The injectate was administered into the level noted above.   Additional Comments:  The patient tolerated the procedure well Dressing: 2 x 2 sterile gauze and Band-Aid    Post-procedure details: Patient was observed during the procedure. Post-procedure instructions were reviewed.  Patient left the clinic in stable condition.

## 2019-03-24 ENCOUNTER — Encounter: Payer: Self-pay | Admitting: Family Medicine

## 2019-03-26 NOTE — Progress Notes (Signed)
Virtual Visit via Video   I connected with [name] on 03/29/19 at  2:20 PM EDT by a video enabled telemedicine application and verified that I am speaking with the correct person using two identifiers. Location patient: Home Location provider: [facility], Office Persons participating in the virtual visit: Caylin Cashlynn, [place], DO Barnie Mort, CMA acting as scribe for Dr. Helane Rima.   I discussed the limitations of evaluation and management by telemedicine and the availability of in person appointments. The patient expressed understanding and agreed to proceed.  Subjective:   HPI: Patient is doing well. She has had increase in b/l back pain. She has injection scheduled but she had to move out until may due to Covid. She is using pain medications as well as patches. Patient request 3 months of scripts for pain medications.   Blood sugars have been going up a little. Last reading was 238. She is working on [employer]. She is having hard time with that due to new stress in life. She has had increased anxiety due to Covid. She has an appointment with psychiatry in next weeks.    ROS: See pertinent positives and negatives per HPI.  Patient Active Problem List   Diagnosis Date Noted  . Chronic midline low back pain with left-sided sciatica 02/16/2019  . Dyspepsia 02/16/2019  . Hyperlipidemia associated with type 2 diabetes mellitus (HCC) 02/16/2019  . Lumbar radiculopathy 11/21/2018  . Chronic back pain 11/10/2018  . PTSD (post-traumatic stress disorder) 11/10/2018  . Bipolar depression (HCC), followed by Psychiatry 11/10/2018  . Sciatica of left side 12/02/2017  . Class 3 severe obesity due to excess calories with serious comorbidity and body mass index (BMI) of 40.0 to 44.9 in adult (HCC) 04/19/2017  . GAD (generalized anxiety disorder) 04/19/2017  . Psychophysiological insomnia 04/19/2017  . Type 2 diabetes mellitus with complication, with long-term  current use of insulin (HCC) 04/19/2017    Social History   Tobacco Use  . Smoking status: Never Smoker  . Smokeless tobacco: Never Used  Substance Use Topics  . Alcohol use: No    Current Outpatient Medications:  .  atorvastatin (LIPITOR) 10 MG tablet, TAKE 1 TABLET (10 MG TOTAL) DAILY BY MOUTH., Disp: 90 tablet, Rfl: 1 .  chlorproMAZINE (THORAZINE) 50 MG tablet, TAKE 1 TABLET BY MOUTH EVERYDAY AT BEDTIME, Disp: 90 tablet, Rfl: 0 .  clonazePAM (KLONOPIN) 0.5 MG tablet, Take 1 tablet (0.5 mg total) by mouth 3 (three) times daily as needed for anxiety., Disp: 90 tablet, Rfl: 2 .  dexlansoprazole (DEXILANT) 60 MG capsule, Take 1 capsule (60 mg total) by mouth daily., Disp: 30 capsule, Rfl: 6 .  gabapentin (NEURONTIN) 600 MG tablet, TAKE 1.5 TABLETS (900 MG TOTAL) BY MOUTH 3 (THREE) TIMES DAILY., Disp: 405 tablet, Rfl: 0 .  HYDROcodone-acetaminophen (NORCO) 10-325 MG tablet, Take 1 tablet by mouth every 8 (eight) hours as needed for severe pain (Chronic pain)., Disp: 90 tablet, Rfl: 0 .  HYDROcodone-acetaminophen (NORCO) 10-325 MG tablet, Take 1 tablet by mouth every 8 (eight) hours as needed for severe pain (Chronic pain)., Disp: 90 tablet, Rfl: 0 .  lamoTRIgine (LAMICTAL) 200 MG tablet, Take 1 tablet (200 mg total) by mouth daily., Disp: 90 tablet, Rfl: 0 .  LEVEMIR FLEXTOUCH 100 UNIT/ML Pen, INJECT 20 UNITS UNDER THE SKIN DAILY AT 10PM (Patient taking differently: 26UNITS UNDER THE SKIN DAILY AT 10PM), Disp: 3 mL, Rfl: 5 .  lidocaine (LIDODERM) 5 %, PLACE 1 PATCH ONTO  THE SKIN DAILY. REMOVE & DISCARD PATCH WITHIN 12 HOURS OR AS DIRECTED BY MD, Disp: 30 patch, Rfl: 0 .  naproxen (NAPROSYN) 500 MG tablet, TAKE 0.5 TABLETS (250 MG TOTAL) BY MOUTH 2 (TWO) TIMES DAILY WITH A MEAL., Disp: 90 tablet, Rfl: 3 .  ondansetron (ZOFRAN-ODT) 4 MG disintegrating tablet, TAKE 1 TABLET BY MOUTH EVERY 8 HOURS AS NEEDED FOR NAUSEA AND VOMITING, Disp: 20 tablet, Rfl: 0 .  OZEMPIC, 1 MG/DOSE, 2 MG/1.5ML SOPN,  INJECT 1 MG INTO THE SKIN ONCE A WEEK., Disp: 3 pen, Rfl: 6  Allergies  Allergen Reactions  . Morphine And Related   . Sulfa Antibiotics     Objective:   VITALS: Per patient if applicable, see vitals. GENERAL: Alert, appears well and in no acute distress. HEENT: Atraumatic, conjunctiva clear, no obvious abnormalities on inspection of external nose and ears. NECK: Normal movements of the head and neck. CARDIOPULMONARY: No increased WOB. Speaking in clear sentences. I:E ratio WNL.  MS: Moves all visible extremities without noticeable abnormality. PSYCH: Pleasant and cooperative, well-groomed. Speech normal rate and rhythm. Affect is appropriate. Insight and judgement are appropriate. Attention is focused, linear, and appropriate.  NEURO: CN grossly intact. Oriented as arrived to appointment on time with no prompting. Moves both UE equally.  SKIN: No obvious lesions, wounds, erythema, or cyanosis noted on face or hands.  Assessment and Plan:   Dari was seen today for pain and discuss labs results.  Diagnoses and all orders for this visit:  Chronic midline low back pain with left-sided sciatica -     HYDROcodone-acetaminophen (NORCO) 10-325 MG tablet; Take 1 tablet by mouth every 8 (eight) hours as needed for severe pain (Chronic pain). -     HYDROcodone-acetaminophen (NORCO) 10-325 MG tablet; Take 1 tablet by mouth every 8 (eight) hours as needed for severe pain (Chronic pain). -     HYDROcodone-acetaminophen (NORCO) 10-325 MG tablet; 1 tab every 8 hours as needed for severe pain. -     lidocaine (LIDODERM) 5 %; Place 1 patch onto the skin daily. Remove & Discard patch within 12 hours or as directed by MD -     lidocaine (LIDODERM) 5 %; Place 1 patch onto the skin daily. Remove & Discard patch within 12 hours or as directed by MD  Dyspepsia Comments: Worse. See orders.  Orders: -     dexlansoprazole (DEXILANT) 60 MG capsule; Take 1 capsule (60 mg total) by mouth  daily.   . Reviewed expectations re: course of current medical issues. . Discussed self-management of symptoms. . Outlined signs and symptoms indicating need for more acute intervention. . Patient verbalized understanding and all questions were answered. Marland Kitchen Health Maintenance issues including appropriate healthy diet, exercise, and smoking avoidance were discussed with patient. . See orders for this visit as documented in the electronic medical record.  Helane Rima, DO 03/29/2019

## 2019-03-28 ENCOUNTER — Telehealth: Payer: Self-pay | Admitting: Family Medicine

## 2019-03-28 ENCOUNTER — Encounter: Payer: Self-pay | Admitting: Family Medicine

## 2019-03-28 ENCOUNTER — Telehealth (INDEPENDENT_AMBULATORY_CARE_PROVIDER_SITE_OTHER): Payer: Self-pay | Admitting: Physical Medicine and Rehabilitation

## 2019-03-28 ENCOUNTER — Ambulatory Visit (INDEPENDENT_AMBULATORY_CARE_PROVIDER_SITE_OTHER): Payer: Medicare Other | Admitting: Family Medicine

## 2019-03-28 ENCOUNTER — Other Ambulatory Visit: Payer: Self-pay

## 2019-03-28 VITALS — Temp 98.6°F | Ht 64.0 in

## 2019-03-28 DIAGNOSIS — M5442 Lumbago with sciatica, left side: Secondary | ICD-10-CM | POA: Diagnosis not present

## 2019-03-28 DIAGNOSIS — G8929 Other chronic pain: Secondary | ICD-10-CM | POA: Diagnosis not present

## 2019-03-28 DIAGNOSIS — R1013 Epigastric pain: Secondary | ICD-10-CM | POA: Diagnosis not present

## 2019-03-28 NOTE — Telephone Encounter (Signed)
Copied from CRM [id number]. Topic: Quick Communication - Rx Refill/Question >> Mar 28, 2019  5:03 PM Jay Schlichter wrote: Medication:HYDROcodone-acetaminophen (NORCO) 10-325 MG tablet and lidocaine (LIDODERM) 5 %  Has the patient contacted their pharmacy? Yes.   (Agent: If no, request that the patient contact the pharmacy for the refill.) (Agent: If yes, when and what did the pharmacy advise?)  Preferred Pharmacy (with phone number or street name): [facility] [address]   Agent: Please be advised that RX refills may take up to 3 business days. We ask that you follow-up with your pharmacy.

## 2019-03-28 NOTE — Telephone Encounter (Signed)
Yes ok, but not lasting very long, may need to try facet at some point again.

## 2019-03-28 NOTE — Telephone Encounter (Signed)
Scheduled for 5/11 at 1530.

## 2019-03-29 MED ORDER — LIDOCAINE 5 % EX PTCH: 1.0000 | MEDICATED_PATCH | CUTANEOUS | 0 refills | Status: DC

## 2019-03-29 MED ORDER — HYDROCODONE-ACETAMINOPHEN 10-325 MG PO TABS: ORAL_TABLET | ORAL | 0 refills | Status: DC

## 2019-03-29 MED ORDER — DEXLANSOPRAZOLE 60 MG PO CPDR: 60.0000 mg | DELAYED_RELEASE_CAPSULE | Freq: Every day | ORAL | 6 refills | Status: DC

## 2019-03-29 MED ORDER — HYDROCODONE-ACETAMINOPHEN 10-325 MG PO TABS: 1.0000 | ORAL_TABLET | Freq: Three times a day (TID) | ORAL | 0 refills | Status: DC | PRN

## 2019-03-29 NOTE — Telephone Encounter (Signed)
Has been sent.

## 2019-03-29 NOTE — Telephone Encounter (Signed)
See note

## 2019-04-06 ENCOUNTER — Other Ambulatory Visit (HOSPITAL_COMMUNITY): Payer: Self-pay | Admitting: *Deleted

## 2019-04-06 DIAGNOSIS — F410 Panic disorder [episodic paroxysmal anxiety] without agoraphobia: Secondary | ICD-10-CM

## 2019-04-06 MED ORDER — CLONAZEPAM 0.5 MG PO TABS: 0.5000 mg | ORAL_TABLET | Freq: Three times a day (TID) | ORAL | 0 refills | Status: DC | PRN

## 2019-04-06 NOTE — Telephone Encounter (Signed)
Medication management - Telephone call with patient to inform Dr. Lolly Mustache had sent in a new Klonopin order for her but that she should not be taking more than prescribed or filling early.  Pt. agreed to keep virtual meeting with Dr. Lolly Mustache set for 04/17/19

## 2019-04-06 NOTE — Telephone Encounter (Signed)
Patient called stating she is in need of a refill on Clonazepam, her last pill will be 4/11. Chart reviewed, has appointment on 4/20, will clarify if MD would like a 30 day refill or just enough until appointment.

## 2019-04-06 NOTE — Telephone Encounter (Signed)
Called refill.  Should not be taking more than prescribed.

## 2019-04-17 ENCOUNTER — Ambulatory Visit (HOSPITAL_COMMUNITY): Payer: Medicare Other | Admitting: Psychiatry

## 2019-04-17 ENCOUNTER — Other Ambulatory Visit: Payer: Self-pay

## 2019-04-17 ENCOUNTER — Ambulatory Visit (INDEPENDENT_AMBULATORY_CARE_PROVIDER_SITE_OTHER): Payer: Medicare Other | Admitting: Psychiatry

## 2019-04-17 DIAGNOSIS — F3131 Bipolar disorder, current episode depressed, mild: Secondary | ICD-10-CM | POA: Diagnosis not present

## 2019-04-17 DIAGNOSIS — F410 Panic disorder [episodic paroxysmal anxiety] without agoraphobia: Secondary | ICD-10-CM

## 2019-04-17 DIAGNOSIS — F4310 Post-traumatic stress disorder, unspecified: Secondary | ICD-10-CM | POA: Diagnosis not present

## 2019-04-17 MED ORDER — CHLORPROMAZINE HCL 50 MG PO TABS: ORAL_TABLET | ORAL | 0 refills | Status: DC

## 2019-04-17 MED ORDER — LAMOTRIGINE 200 MG PO TABS: 200.0000 mg | ORAL_TABLET | Freq: Every day | ORAL | 0 refills | Status: DC

## 2019-04-17 MED ORDER — CLONAZEPAM 0.5 MG PO TABS: 0.5000 mg | ORAL_TABLET | Freq: Four times a day (QID) | ORAL | 2 refills | Status: DC | PRN
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
## 2019-04-19 ENCOUNTER — Other Ambulatory Visit: Payer: Self-pay | Admitting: Family Medicine

## 2019-04-19 DIAGNOSIS — Z794 Long term (current) use of insulin: Secondary | ICD-10-CM

## 2019-04-19 DIAGNOSIS — E118 Type 2 diabetes mellitus with unspecified complications: Secondary | ICD-10-CM

## 2019-04-19 NOTE — Telephone Encounter (Signed)
Last OV 03/28/2019 Last refill atorvastatin 11/10/18 #90/0                 Ondansetron 03/16/19 #20/0 Next OV 05/08/19

## 2019-04-19 NOTE — Telephone Encounter (Signed)
Forwarding to Dr. Wallace.  

## 2019-04-25 ENCOUNTER — Other Ambulatory Visit: Payer: Self-pay | Admitting: Family Medicine

## 2019-04-25 DIAGNOSIS — M5432 Sciatica, left side: Secondary | ICD-10-CM

## 2019-04-25 NOTE — Telephone Encounter (Signed)
Last OV 03/28/19 Last refill 02/08/19 #405/0 Next OV 05/08/19

## 2019-05-05 ENCOUNTER — Other Ambulatory Visit: Payer: Self-pay | Admitting: Family Medicine

## 2019-05-05 DIAGNOSIS — Z794 Long term (current) use of insulin: Secondary | ICD-10-CM

## 2019-05-05 DIAGNOSIS — E118 Type 2 diabetes mellitus with unspecified complications: Secondary | ICD-10-CM

## 2019-05-08 ENCOUNTER — Encounter: Payer: Self-pay | Admitting: Physical Medicine and Rehabilitation

## 2019-05-08 ENCOUNTER — Other Ambulatory Visit: Payer: Self-pay

## 2019-05-08 ENCOUNTER — Ambulatory Visit (INDEPENDENT_AMBULATORY_CARE_PROVIDER_SITE_OTHER): Payer: Medicare Other | Admitting: Family Medicine

## 2019-05-08 ENCOUNTER — Encounter: Payer: Self-pay | Admitting: Family Medicine

## 2019-05-08 DIAGNOSIS — G8929 Other chronic pain: Secondary | ICD-10-CM

## 2019-05-08 DIAGNOSIS — M5442 Lumbago with sciatica, left side: Secondary | ICD-10-CM | POA: Diagnosis not present

## 2019-05-08 MED ORDER — HYDROCODONE-ACETAMINOPHEN 10-325 MG PO TABS: ORAL_TABLET | ORAL | 0 refills | Status: DC

## 2019-05-08 MED ORDER — HYDROCODONE-ACETAMINOPHEN 10-325 MG PO TABS: 1.0000 | ORAL_TABLET | Freq: Three times a day (TID) | ORAL | 0 refills | Status: DC | PRN

## 2019-05-08 NOTE — Progress Notes (Signed)
Virtual Visit via Video   Due to the COVID-19 pandemic, this visit was completed with telemedicine (audio/video) technology to reduce patient and provider exposure as well as to preserve personal protective equipment.   I connected with [name] by a video enabled telemedicine application and verified that I am speaking with the correct person using two identifiers. Location patient: Home Location provider: [facility], Office Persons participating in the virtual visit: Melora Kailanni, [place], DO Barnie Mort, CMA acting as scribe for Dr. Helane Rima.   I discussed the limitations of evaluation and management by telemedicine and the availability of in person appointments. The patient expressed understanding and agreed to proceed.  Care Team   Patient Care Team: Helane Rima, DO as PCP - General (Family Medicine)  Subjective:   HPI: Patient in for three month follow up. Patient has had increased anxiety with father who is sick. She has been coping with everything very well. She is taking norco for pain. She feels like the dose she is on is helping very well and does not want to make any changes in it now.   She is aware that her blood sugars have been high due to stress eating. She is working on watching diet and exercising.  Lab Results  Component Value Date   HGBA1C 7.7 (H) 02/13/2019   Lab Results  Component Value Date   CHOL 163 02/13/2019   HDL 60.30 02/13/2019   LDLCALC 74 02/13/2019   TRIG 143.0 02/13/2019   CHOLHDL 3 02/13/2019   Lab Results  Component Value Date   ALT 16 02/13/2019   AST 12 02/13/2019   ALKPHOS 102 02/13/2019   BILITOT 0.4 02/13/2019    Review of Systems  Constitutional: Negative for chills and fever.  HENT: Negative for hearing loss and tinnitus.   Eyes: Negative for blurred vision and double vision.  Respiratory: Negative for cough.   Cardiovascular: Negative for chest pain and palpitations.  Gastrointestinal: Negative for  heartburn and nausea.  Genitourinary: Negative for dysuria.  Musculoskeletal: Negative for myalgias.  Skin: Negative for rash.  Neurological: Negative for dizziness and headaches.  Endo/Heme/Allergies: Does not bruise/bleed easily.  Psychiatric/Behavioral: Negative for depression.    Patient Active Problem List   Diagnosis Date Noted  . Chronic midline low back pain with left-sided sciatica 02/16/2019  . Dyspepsia 02/16/2019  . Hyperlipidemia associated with type 2 diabetes mellitus (HCC) 02/16/2019  . Lumbar radiculopathy 11/21/2018  . Chronic back pain 11/10/2018  . PTSD (post-traumatic stress disorder) 11/10/2018  . Bipolar depression (HCC), followed by Psychiatry 11/10/2018  . Sciatica of left side 12/02/2017  . Class 3 severe obesity due to excess calories with serious comorbidity and body mass index (BMI) of 40.0 to 44.9 in adult (HCC) 04/19/2017  . GAD (generalized anxiety disorder) 04/19/2017  . Psychophysiological insomnia 04/19/2017  . Type 2 diabetes mellitus with complication, with long-term current use of insulin (HCC) 04/19/2017    Social History   Tobacco Use  . Smoking status: Never Smoker  . Smokeless tobacco: Never Used  Substance Use Topics  . Alcohol use: No   Current Outpatient Medications:  .  atorvastatin (LIPITOR) 10 MG tablet, TAKE 1 TABLET (10 MG TOTAL) DAILY BY MOUTH., Disp: 90 tablet, Rfl: 1 .  chlorproMAZINE (THORAZINE) 50 MG tablet, TAKE 1 TABLET BY MOUTH EVERYDAY AT BEDTIME, Disp: 90 tablet, Rfl: 0 .  clonazePAM (KLONOPIN) 0.5 MG tablet, Take 1 tablet (0.5 mg total) by mouth 4 (four) times daily as needed  for anxiety., Disp: 120 tablet, Rfl: 2 .  dexlansoprazole (DEXILANT) 60 MG capsule, Take 1 capsule (60 mg total) by mouth daily., Disp: 30 capsule, Rfl: 6 .  gabapentin (NEURONTIN) 600 MG tablet, TAKE 1.5 TABLETS (900 MG TOTAL) BY MOUTH 3 (THREE) TIMES DAILY., Disp: 405 tablet, Rfl: 0 .  [START ON 05/27/2019] HYDROcodone-acetaminophen (NORCO) 10-325  MG tablet, Take 1 tablet by mouth every 8 (eight) hours as needed for severe pain (Chronic pain)., Disp: 90 tablet, Rfl: 0 .  HYDROcodone-acetaminophen (NORCO) 10-325 MG tablet, Take 1 tablet by mouth every 8 (eight) hours as needed for severe pain (Chronic pain)., Disp: 90 tablet, Rfl: 0 .  HYDROcodone-acetaminophen (NORCO) 10-325 MG tablet, 1 tab every 8 hours as needed for severe pain., Disp: 90 tablet, Rfl: 0 .  lamoTRIgine (LAMICTAL) 200 MG tablet, Take 1 tablet (200 mg total) by mouth daily., Disp: 90 tablet, Rfl: 0 .  LEVEMIR FLEXTOUCH 100 UNIT/ML Pen, INJECT 20 UNITS UNDER THE SKIN DAILY AT 10PM (Patient taking differently: 26UNITS UNDER THE SKIN DAILY AT 10PM), Disp: 3 mL, Rfl: 5 .  lidocaine (LIDODERM) 5 %, Place 1 patch onto the skin daily. Remove & Discard patch within 12 hours or as directed by MD, Disp: 30 patch, Rfl: 0 .  lidocaine (LIDODERM) 5 %, Place 1 patch onto the skin daily. Remove & Discard patch within 12 hours or as directed by MD, Disp: 30 patch, Rfl: 0 .  naproxen (NAPROSYN) 500 MG tablet, TAKE 0.5 TABLETS (250 MG TOTAL) BY MOUTH 2 (TWO) TIMES DAILY WITH A MEAL., Disp: 90 tablet, Rfl: 3 .  ondansetron (ZOFRAN-ODT) 4 MG disintegrating tablet, TAKE 1 TABLET BY MOUTH EVERY 8 HOURS AS NEEDED FOR NAUSEA AND VOMITING, Disp: 20 tablet, Rfl: 0 .  OZEMPIC, 1 MG/DOSE, 2 MG/1.5ML SOPN, INJECT 1 MG INTO THE SKIN ONCE A WEEK., Disp: 9 pen, Rfl: 0  Allergies  Allergen Reactions  . Morphine And Related   . Sulfa Antibiotics    Objective:   VITALS: Per patient if applicable, see vitals. GENERAL: Alert, appears well and in no acute distress. HEENT: Atraumatic, conjunctiva clear, no obvious abnormalities on inspection of external nose and ears. NECK: Normal movements of the head and neck. CARDIOPULMONARY: No increased WOB. Speaking in clear sentences. I:E ratio WNL.  MS: Moves all visible extremities without noticeable abnormality. PSYCH: Pleasant and cooperative, well-groomed.  Speech normal rate and rhythm. Affect is appropriate. Insight and judgement are appropriate. Attention is focused, linear, and appropriate.  NEURO: CN grossly intact. Oriented as arrived to appointment on time with no prompting. Moves both UE equally.  SKIN: No obvious lesions, wounds, erythema, or cyanosis noted on face or hands.  Depression screen [facility] 2/9 02/13/2019 04/19/2017  Decreased Interest 0 0  Down, Depressed, Hopeless 0 0  PHQ - 2 Score 0 0  Altered sleeping 0 -  Tired, decreased energy 1 -  Change in appetite 0 -  Feeling bad or failure about yourself  0 -  Trouble concentrating 0 -  Moving slowly or fidgety/restless 0 -  Suicidal thoughts 0 -  PHQ-9 Score 1 -  Difficult doing work/chores Not difficult at all -   Assessment and Plan:   Barbarann was seen today for follow-up.  Diagnoses and all orders for this visit:  Chronic midline low back pain with left-sided sciatica -     HYDROcodone-acetaminophen (NORCO) 10-325 MG tablet; Take 1 tablet by mouth every 8 (eight) hours as needed for severe pain (Chronic pain). -  HYDROcodone-acetaminophen (NORCO) 10-325 MG tablet; 1 tab every 8 hours as needed for severe pain. -     HYDROcodone-acetaminophen (NORCO) 10-325 MG tablet; Take 1 tablet by mouth every 8 (eight) hours as needed for up to 5 days.   Marland Kitchen. COVID-19 Education: The signs and symptoms of COVID-19 were discussed with the patient and how to seek care for testing if needed. The importance of social distancing was discussed today. . Reviewed expectations re: course of current medical issues. . Discussed self-management of symptoms. . Outlined signs and symptoms indicating need for more acute intervention. . Patient verbalized understanding and all questions were answered. Marland Kitchen. Health Maintenance issues including appropriate healthy diet, exercise, and smoking avoidance were discussed with patient. . See orders for this visit as documented in the electronic medical  record.  Helane RimaErica Altin Sease, DO  Records requested if needed. Time spent:25 minutes, of which >50% was spent in obtaining information about her symptoms, reviewing her previous labs, evaluations, and treatments, counseling her about her condition (please see the discussed topics above), and developing a plan to further investigate it; she had a number of questions which I addressed.

## 2019-05-12 ENCOUNTER — Ambulatory Visit (INDEPENDENT_AMBULATORY_CARE_PROVIDER_SITE_OTHER): Payer: Medicare Other | Admitting: Physician Assistant

## 2019-05-12 ENCOUNTER — Encounter: Payer: Self-pay | Admitting: Physician Assistant

## 2019-05-12 VITALS — Ht 64.0 in | Wt 228.0 lb

## 2019-05-12 DIAGNOSIS — Z0000 Encounter for general adult medical examination without abnormal findings: Secondary | ICD-10-CM

## 2019-05-12 DIAGNOSIS — E118 Type 2 diabetes mellitus with unspecified complications: Secondary | ICD-10-CM

## 2019-05-12 DIAGNOSIS — Z794 Long term (current) use of insulin: Secondary | ICD-10-CM

## 2019-05-12 NOTE — Progress Notes (Signed)
I acted as a Neurosurgeonscribe for Energy East CorporationSamantha Catheryne Deford, PA-C Corky Mullonna Orphanos, LPN   Chief Complaint:  Jodi Nurseammy Salmon is a 48 y.o. female who presents today for a virtual subsequent Medicare Annual Wellness Visit and to discuss management of her chronic medical problems.  Assessment/Plan:   Type 2 diabetes mellitus with complication, with long-term current use of insulin (HCC) Discussed briefly. Uncontrolled. Fasting sugars around 259. Discussed the most recent recommendation from PCP regarding increasing levemir by 2 units slowly to lower fasting blood sugars, as she states that she has not been doing this. Counseled on sodas, currently drinking 4 cans of Dr. Reino KentPepper daily. Made a goal to decrease to 1 can daily by the time of her next appointment with Dr. Earlene PlaterWallace, which was made today for 05/29/2019 at 4p for DM f/u.    Preventative Healthcare Health Maintenance Due  Topic Date Due  . OPHTHALMOLOGY EXAM  07/05/2018  . URINE MICROALBUMIN  04/20/2019    During the course of the visit the patient was educated and counseled about appropriate screening and preventive services including:        Fall prevention   Nutrition Physical Activity Weight Management Cognition    Subjective:  HPI:  Overall doing well, except she endorses ongoing dietary indiscretion during COVID-19. She has increased her regular Dr. Reino KentPepper intake from 1 can to 4 cans daily. She has gained "at least 10 lb, but I'm afraid to weigh myself to see the exact number." She has emotional stress and often overeats. She is up to 29 units of levemir daily. She did not increase it as instructed by PCP to help with lowering her fasting blood sugars.  She states that she is doing Ozempic weekly.  Health Risk Assessment: Patient considers her overall health to be fair. He has no difficulty performing the following: . Preparing food and eating . Bathing  . Getting dressed . Using the toilet . Shopping . Managing Finances . Moving around  from place to place  She has not had any falls within the past year.   [facility] 05/12/[facility]  Decreased Interest 0  Down, Depressed, Hopeless 0  PHQ - 2 Score 0  Altered sleeping 1  Tired, decreased energy 1  Change in appetite 2  Feeling bad or failure about yourself  0  Trouble concentrating 0  Moving slowly or fidgety/restless 0  Suicidal thoughts 0  PHQ-9 Score 4  Difficult doing work/chores Not difficult at all    Lifestyle Factors: Diet: admits to lots of junk food and sodas Exercise: none  Patient Care Team: Helane RimaWallace, Erica, DO as PCP - General (Family Medicine) Lolly MustacheArfeen, Phillips GroutSyed T, MD as Consulting Physician (Psychiatry) Tyrell AntonioNewton, Frederic, MD as Consulting Physician (Physical Medicine and Rehabilitation)   Her chronic medical conditions are outlined below:  ROS: Per HPI  PMH:  The following were reviewed and entered/updated in epic: Past Medical History:  Diagnosis Date  . Anxiety   . Depression   . Diabetes mellitus without complication (HCC)   . GERD (gastroesophageal reflux disease)   . Headache   . Neuropathy    Past Surgical History:  Procedure Laterality Date  . APPENDECTOMY    . CHOLECYSTECTOMY    . TONSILLECTOMY AND ADENOIDECTOMY     Family History  Problem Relation Age of Onset  . Hyperlipidemia Mother   . Hypertension Mother   . Stroke Mother   . Depression Mother   . Drug abuse Mother   . Hyperlipidemia Father   .  Hypertension Father   . Stroke Father   . Bipolar disorder Father   . Hyperlipidemia Brother   . Hypertension Brother   . Anxiety disorder Brother     Medications- reviewed and updated Current Outpatient Medications  Medication Sig Dispense Refill  . atorvastatin (LIPITOR) 10 MG tablet TAKE 1 TABLET (10 MG TOTAL) DAILY BY MOUTH. 90 tablet 1  . chlorproMAZINE (THORAZINE) 50 MG tablet TAKE 1 TABLET BY MOUTH EVERYDAY AT BEDTIME 90 tablet 0  . clonazePAM (KLONOPIN) 0.5 MG tablet Take 1 tablet (0.5 mg total) by  mouth 4 (four) times daily as needed for anxiety. 120 tablet 2  . dexlansoprazole (DEXILANT) 60 MG capsule Take 1 capsule (60 mg total) by mouth daily. 30 capsule 6  . gabapentin (NEURONTIN) 600 MG tablet TAKE 1.5 TABLETS (900 MG TOTAL) BY MOUTH 3 (THREE) TIMES DAILY. 405 tablet 0  . [START ON 06/27/2019] HYDROcodone-acetaminophen (NORCO) 10-325 MG tablet Take 1 tablet by mouth every 8 (eight) hours as needed for severe pain (Chronic pain). 90 tablet 0  . [START ON 07/27/2019] HYDROcodone-acetaminophen (NORCO) 10-325 MG tablet 1 tab every 8 hours as needed for severe pain. 90 tablet 0  . [START ON 08/27/2019] HYDROcodone-acetaminophen (NORCO) 10-325 MG tablet Take 1 tablet by mouth every 8 (eight) hours as needed for up to 5 days. 90 tablet 0  . lamoTRIgine (LAMICTAL) 200 MG tablet Take 1 tablet (200 mg total) by mouth daily. 90 tablet 0  . LEVEMIR FLEXTOUCH 100 UNIT/ML Pen INJECT 20 UNITS UNDER THE SKIN DAILY AT 10PM (Patient taking differently: 29 Units. ) 3 mL 5  . lidocaine (LIDODERM) 5 % Place 1 patch onto the skin daily. Remove & Discard patch within 12 hours or as directed by MD 30 patch 0  . naproxen (NAPROSYN) 500 MG tablet TAKE 0.5 TABLETS (250 MG TOTAL) BY MOUTH 2 (TWO) TIMES DAILY WITH A MEAL. 90 tablet 3  . ondansetron (ZOFRAN-ODT) 4 MG disintegrating tablet TAKE 1 TABLET BY MOUTH EVERY 8 HOURS AS NEEDED FOR NAUSEA AND VOMITING 20 tablet 0  . OZEMPIC, 1 MG/DOSE, 2 MG/1.5ML SOPN INJECT 1 MG INTO THE SKIN ONCE A WEEK. 9 pen 0   No current facility-administered medications for this visit.     Allergies-reviewed and updated Allergies  Allergen Reactions  . Morphine And Related   . Sulfa Antibiotics     Social History   Socioeconomic History  . Marital status: Divorced    Spouse name: Not on file  . Number of children: Not on file  . Years of education: Not on file  . Highest education level: Not on file  Occupational History  . Not on file  Social Needs  . Financial resource  strain: Not on file  . Food insecurity:    Worry: Not on file    Inability: Not on file  . Transportation needs:    Medical: Not on file    Non-medical: Not on file  Tobacco Use  . Smoking status: Never Smoker  . Smokeless tobacco: Never Used  Substance and Sexual Activity  . Alcohol use: No  . Drug use: No  . Sexual activity: Not Currently    Birth control/protection: None  Lifestyle  . Physical activity:    Days per week: Not on file    Minutes per session: Not on file  . Stress: Not on file  Relationships  . Social connections:    Talks on phone: Not on file    Gets together: Not on  file    Attends religious service: Not on file    Active member of club or organization: Not on file    Attends meetings of clubs or organizations: Not on file    Relationship status: Not on file  Other Topics Concern  . Not on file  Social History Narrative   She is divorced with 4 grown children (3 sons, 1 daughter).    Moved to [place] to take care of her granddaughter.          Objective/Observations  Physical Exam: Ht  (1.626 m)   Wt 228 lb (103.4 kg) Comment: per pt 3 months ago, does not have a scale  LMP 05/09/2019   BMI 39.14 kg/m  Gen: NAD, resting comfortably Pulm: Normal work of breathing Neuro: Grossly normal, moves all extremities. No apparent cognitive impairment.  Psych: Normal affect and thought content  No results found for this or any previous visit (from the past 24 hour(s)).   Virtual Visit via Video   I connected with Jodi Wolfe on 05/12/19 at  3:00 PM EDT by a video enabled telemedicine application and verified that I am speaking with the correct person using two identifiers. I discussed the limitations of evaluation and management by telemedicine and the availability of in person appointments. The patient expressed understanding and agreed to proceed.   Patient location: Home Provider location: [place] [employer] Persons participating  in the virtual visit: Myself,  Corky Mull, LPN and Patient     Jarold Motto, Cordelia Poche 05/12/2019 3:28 PM

## 2019-05-12 NOTE — Assessment & Plan Note (Signed)
Discussed briefly. Uncontrolled. Fasting sugars around 259. Discussed the most recent recommendation from PCP regarding increasing levemir by 2 units slowly to lower fasting blood sugars, as she states that she has not been doing this. Counseled on sodas, currently drinking 4 cans of Dr. Reino Kent daily. Made a goal to decrease to 1 can daily by the time of her next appointment with Dr. Earlene Plater, which was made today for 05/29/2019 at 4p for DM f/u.

## 2019-05-23 ENCOUNTER — Telehealth: Payer: Self-pay | Admitting: Family Medicine

## 2019-05-23 ENCOUNTER — Encounter: Payer: Self-pay | Admitting: Family Medicine

## 2019-05-23 NOTE — Telephone Encounter (Signed)
See request °

## 2019-05-23 NOTE — Telephone Encounter (Signed)
MEDICATION: HYDROcodone-acetaminophen (NORCO) 10-325 MG tablet  PHARMACY:  [id number] - [place] - [address]  IS THIS A 90 DAY SUPPLY : Yes  IS PATIENT OUT OF MEDICATION: No  IF NOT; HOW MUCH IS LEFT: 1 day left   LAST APPOINTMENT DATE: @5 /26/2020  NEXT APPOINTMENT DATE:@6 /12/2018  OTHER COMMENTS: Medication cant be refilled until the 30th but pt will be out of medication before then because the pharmacy filled it earlier than anticipated.     **Let patient know to contact pharmacy at the end of the day to make sure medication is ready. **  ** Please notify patient to allow 48-72 hours to process**  **Encourage patient to contact the pharmacy for refills or they can request refills through [facility]**

## 2019-05-23 NOTE — Telephone Encounter (Signed)
Copied from CRM [id number]. Topic: Quick Communication - Rx Refill/Question >> May 23, 2019  9:38 AM Elliot Gault wrote: Medication: HYDROcodone-acetaminophen (NORCO) 10-325 MG tablet (patient states pharmacy refilled early) patient will run out tomorrow)    Preferred Pharmacy (with phone number or street name):  [id number] [place], [place] - [address] [phone] (Phone) [phone] (Fax)    Agent: Please be advised that RX refills may take up to 3 business days. We ask that you follow-up with your pharmacy.

## 2019-05-24 ENCOUNTER — Telehealth: Payer: Self-pay

## 2019-05-24 ENCOUNTER — Encounter: Payer: Self-pay | Admitting: Family Medicine

## 2019-05-24 NOTE — Telephone Encounter (Signed)
Pt calling back about Rx.  Pt states yes, she will be out at end of this day.  Requesting to change date to fill.  Pharmacy filled early last month.  Pt would like a call back asap.

## 2019-05-24 NOTE — Telephone Encounter (Signed)
Pt is officially out and is requesting refill as soon as available. Please advise.

## 2019-05-24 NOTE — Telephone Encounter (Signed)
Addressed via mychart

## 2019-05-25 NOTE — Telephone Encounter (Signed)
Rx has been refilled.  

## 2019-05-25 NOTE — Telephone Encounter (Signed)
Checked online she is due for refill today 5/28. We just need to call pharmacy and let them know ok to fill today.

## 2019-05-25 NOTE — Telephone Encounter (Signed)
Called pharmacy and gave verbal auth for early refill today. Called pt and advised.

## 2019-05-29 ENCOUNTER — Encounter: Payer: Self-pay | Admitting: Family Medicine

## 2019-05-29 ENCOUNTER — Other Ambulatory Visit: Payer: Self-pay

## 2019-05-29 ENCOUNTER — Telehealth (HOSPITAL_COMMUNITY): Payer: Self-pay

## 2019-05-29 ENCOUNTER — Other Ambulatory Visit: Payer: Self-pay | Admitting: Family Medicine

## 2019-05-29 ENCOUNTER — Ambulatory Visit (INDEPENDENT_AMBULATORY_CARE_PROVIDER_SITE_OTHER): Payer: Medicare Other | Admitting: Family Medicine

## 2019-05-29 VITALS — BP 120/64 | HR 89 | Temp 98.6°F | Ht 64.0 in | Wt 222.2 lb

## 2019-05-29 DIAGNOSIS — Z794 Long term (current) use of insulin: Secondary | ICD-10-CM | POA: Diagnosis not present

## 2019-05-29 DIAGNOSIS — E785 Hyperlipidemia, unspecified: Secondary | ICD-10-CM

## 2019-05-29 DIAGNOSIS — E118 Type 2 diabetes mellitus with unspecified complications: Secondary | ICD-10-CM

## 2019-05-29 DIAGNOSIS — M5442 Lumbago with sciatica, left side: Secondary | ICD-10-CM | POA: Diagnosis not present

## 2019-05-29 DIAGNOSIS — G8929 Other chronic pain: Secondary | ICD-10-CM

## 2019-05-29 DIAGNOSIS — E1169 Type 2 diabetes mellitus with other specified complication: Secondary | ICD-10-CM

## 2019-05-29 DIAGNOSIS — Z6841 Body Mass Index (BMI) 40.0 and over, adult: Secondary | ICD-10-CM | POA: Diagnosis not present

## 2019-05-29 NOTE — Progress Notes (Signed)
Jodi Wolfe is a 48 y.o. female is here for follow up.  History of Present Illness:   Jodi Wolfe, CMA acting as scribe for Dr. Helane RimaErica Etoy Mcdonnell.   HPI:   1. Type 2 diabetes mellitus with complication, with long-term current use of insulin (HCC). Medication compliance: compliant all of the time, diabetic diet compliance: compliant all of the time, home glucose monitoring: is performed regularly, further diabetic ROS: no polyuria or polydipsia, no chest pain, dyspnea or TIA's, no numbness, tingling or pain in extremities.  2. Hyperlipidemia associated with type 2 diabetes mellitus (HCC) Is the patient taking medications without problems? Yes. Does the patient complain of muscle aches?  No. Trying to exercise on a regular basis? Yes. Compliant with diet? No.  Lab Results  Component Value Date   CHOL 163 02/13/2019   HDL 60.30 02/13/2019   LDLCALC 74 02/13/2019   TRIG 143.0 02/13/2019   CHOLHDL 3 02/13/2019   Lab Results  Component Value Date   ALT 16 02/13/2019   AST 12 02/13/2019   ALKPHOS 102 02/13/2019   BILITOT 0.4 02/13/2019     3. Class 3 severe obesity due to excess calories with serious comorbidity and body mass index (BMI) of 40.0 to 44.9 in adult [facility](HCC) Wt Readings from Last 3 Encounters:  05/29/19 222 lb 3.2 oz (100.8 kg)  05/12/19 228 lb (103.4 kg)  05/08/19 223 lb (101.2 kg)   4. Chronic midline low back pain with left-sided sciatica, on chronic opioid. Inappropriate side effects (including oversedation/altered mental status)? no Date of contract 11/01/2017. New  prescription drug database checked? yes Are non-opiod therapies optimized? yes UDS date: 11/29/2017  Health Maintenance Due  Topic Date Due  . OPHTHALMOLOGY EXAM  07/05/2018  . URINE MICROALBUMIN  04/20/2019   [facility] 05/12/2019 02/13/2019 04/19/2017  Decreased Interest 0 0 0  Down, Depressed, Hopeless 0 0 0  PHQ - 2 Score 0 0 0  Altered sleeping 1 0 -  Tired, decreased energy 1  1 -  Change in appetite 2 0 -  Feeling bad or failure about yourself  0 0 -  Trouble concentrating 0 0 -  Moving slowly or fidgety/restless 0 0 -  Suicidal thoughts 0 0 -  PHQ-9 Score 4 1 -  Difficult doing work/chores Not difficult at all Not difficult at all -   PMHx, SurgHx, SocialHx, FamHx, Medications, and Allergies were reviewed in the Visit Navigator and updated as appropriate.   Patient Active Problem List   Diagnosis Date Noted  . Chronic midline low back pain with left-sided sciatica 02/16/2019  . Dyspepsia 02/16/2019  . Hyperlipidemia associated with type 2 diabetes mellitus (HCC) 02/16/2019  . Lumbar radiculopathy 11/21/2018  . Chronic back pain 11/10/2018  . PTSD (post-traumatic stress disorder) 11/10/2018  . Bipolar depression (HCC), followed by Psychiatry 11/10/2018  . Sciatica of left side 12/02/2017  . Class 3 severe obesity due to excess calories with serious comorbidity and body mass index (BMI) of 40.0 to 44.9 in adult (HCC) 04/19/2017  . GAD (generalized anxiety disorder) 04/19/2017  . Psychophysiological insomnia 04/19/2017  . Type 2 diabetes mellitus with complication, with long-term current use of insulin (HCC) 04/19/2017   Social History   Tobacco Use  . Smoking status: Never Smoker  . Smokeless tobacco: Never Used  Substance Use Topics  . Alcohol use: No  . Drug use: No   Current Medications and Allergies   Current Outpatient Medications:  .  atorvastatin (LIPITOR) 10 MG  tablet, TAKE 1 TABLET (10 MG TOTAL) DAILY BY MOUTH., Disp: 90 tablet, Rfl: 1 .  chlorproMAZINE (THORAZINE) 50 MG tablet, TAKE 1 TABLET BY MOUTH EVERYDAY AT BEDTIME, Disp: 90 tablet, Rfl: 0 .  clonazePAM (KLONOPIN) 0.5 MG tablet, Take 1 tablet (0.5 mg total) by mouth 4 (four) times daily as needed for anxiety., Disp: 120 tablet, Rfl: 2 .  dexlansoprazole (DEXILANT) 60 MG capsule, Take 1 capsule (60 mg total) by mouth daily., Disp: 30 capsule, Rfl: 6 .  gabapentin (NEURONTIN) 600 MG  tablet, TAKE 1.5 TABLETS (900 MG TOTAL) BY MOUTH 3 (THREE) TIMES DAILY., Disp: 405 tablet, Rfl: 0 .  [START ON 06/27/2019] HYDROcodone-acetaminophen (NORCO) 10-325 MG tablet, Take 1 tablet by mouth every 8 (eight) hours as needed for severe pain (Chronic pain)., Disp: 90 tablet, Rfl: 0 .  [START ON 07/27/2019] HYDROcodone-acetaminophen (NORCO) 10-325 MG tablet, 1 tab every 8 hours as needed for severe pain., Disp: 90 tablet, Rfl: 0 .  [START ON 08/27/2019] HYDROcodone-acetaminophen (NORCO) 10-325 MG tablet, Take 1 tablet by mouth every 8 (eight) hours as needed for up to 5 days., Disp: 90 tablet, Rfl: 0 .  lamoTRIgine (LAMICTAL) 200 MG tablet, Take 1 tablet (200 mg total) by mouth daily., Disp: 90 tablet, Rfl: 0 .  LEVEMIR FLEXTOUCH 100 UNIT/ML Pen, INJECT 20 UNITS UNDER THE SKIN DAILY AT 10PM (Patient taking differently: 29 Units. ), Disp: 3 mL, Rfl: 5 .  lidocaine (LIDODERM) 5 %, Place 1 patch onto the skin daily. Remove & Discard patch within 12 hours or as directed by MD, Disp: 30 patch, Rfl: 0 .  naproxen (NAPROSYN) 500 MG tablet, TAKE 0.5 TABLETS (250 MG TOTAL) BY MOUTH 2 (TWO) TIMES DAILY WITH A MEAL., Disp: 90 tablet, Rfl: 3 .  ondansetron (ZOFRAN-ODT) 4 MG disintegrating tablet, TAKE 1 TABLET BY MOUTH EVERY 8 HOURS AS NEEDED FOR NAUSEA AND VOMITING, Disp: 20 tablet, Rfl: 0 .  OZEMPIC, 1 MG/DOSE, 2 MG/1.5ML SOPN, INJECT 1 MG INTO THE SKIN ONCE A WEEK., Disp: 9 pen, Rfl: 0   Allergies  Allergen Reactions  . Morphine And Related   . Sulfa Antibiotics    Review of Systems   Pertinent items are noted in the HPI. Otherwise, a complete ROS is negative.  Vitals   Vitals:   05/29/19 1559  BP: 120/64  Pulse: 89  Temp: 98.6 F (37 C)  TempSrc: Oral  SpO2: 93%  Weight: 222 lb 3.2 oz (100.8 kg)  Height:  (1.626 m)     Body mass index is 38.14 kg/m.  Physical Exam   Physical Exam Vitals signs and nursing note reviewed.  HENT:     Head: Normocephalic and atraumatic.     Nose:  Nose normal.     Mouth/Throat:     Mouth: Mucous membranes are moist.  Eyes:     Pupils: Pupils are equal, round, and reactive to light.  Neck:     Musculoskeletal: Normal range of motion and neck supple.  Cardiovascular:     Rate and Rhythm: Normal rate and regular rhythm.     Heart sounds: Normal heart sounds.  Pulmonary:     Effort: Pulmonary effort is normal.  Abdominal:     Palpations: Abdomen is soft.  Skin:    General: Skin is warm.  Psychiatric:        Behavior: Behavior normal.     Results for orders placed or performed in visit on 02/13/19  Lipid panel  Result Value Ref Range  Cholesterol 163 0 - 200 mg/dL   Triglycerides 130.8 0.0 - 149.0 mg/dL   HDL 65.78 >46.96 mg/dL   VLDL 29.5 0.0 - 28.4 mg/dL   LDL Cholesterol 74 0 - 99 mg/dL   Total CHOL/HDL Ratio 3    NonHDL 102.52   Hemoglobin A1c  Result Value Ref Range   Hgb A1c MFr Bld 7.7 (H) 4.6 - 6.5 %  Comprehensive metabolic panel  Result Value Ref Range   Sodium 137 135 - 145 mEq/L   Potassium 4.7 3.5 - 5.1 mEq/L   Chloride 97 96 - 112 mEq/L   CO2 31 19 - 32 mEq/L   Glucose, Bld 138 (H) 70 - 99 mg/dL   BUN 8 6 - 23 mg/dL   Creatinine, Ser 1.32 0.40 - 1.20 mg/dL   Total Bilirubin 0.4 0.2 - 1.2 mg/dL   Alkaline Phosphatase 102 39 - 117 U/L   AST 12 0 - 37 U/L   ALT 16 0 - 35 U/L   Total Protein 7.3 6.0 - 8.3 g/dL   Albumin 4.4 3.5 - 5.2 g/dL   Calcium 9.7 8.4 - 44.0 mg/dL   GFR 102.72 >53.66 mL/min  CBC with Differential/Platelet  Result Value Ref Range   WBC 7.5 4.0 - 10.5 K/uL   RBC 4.55 3.87 - 5.11 Mil/uL   Hemoglobin 12.7 12.0 - 15.0 g/dL   HCT 44.0 34.7 - 42.5 %   MCV 85.7 78.0 - 100.0 fl   MCHC 32.6 30.0 - 36.0 g/dL   RDW 95.6 (H) 38.7 - 56.4 %   Platelets 210.0 150.0 - 400.0 K/uL   Neutrophils Relative % 59.1 43.0 - 77.0 %   Lymphocytes Relative 33.8 12.0 - 46.0 %   Monocytes Relative 4.8 3.0 - 12.0 %   Eosinophils Relative 1.0 0.0 - 5.0 %   Basophils Relative 1.3 0.0 - 3.0 %   Neutro  Abs 4.4 1.4 - 7.7 K/uL   Lymphs Abs 2.5 0.7 - 4.0 K/uL   Monocytes Absolute 0.4 0.1 - 1.0 K/uL   Eosinophils Absolute 0.1 0.0 - 0.7 K/uL   Basophils Absolute 0.1 0.0 - 0.1 K/uL    Assessment and Plan   Tanaia was seen today for follow-up.  Diagnoses and all orders for this visit:  Type 2 diabetes mellitus with complication, with long-term current use of insulin (HCC) Comments: Weight stable.  Labs today. Orders: -     CBC with Differential/Platelet -     Comprehensive metabolic panel -     Hemoglobin A1c -     Microalbumin / creatinine urine ratio  Hyperlipidemia associated with type 2 diabetes mellitus (HCC) Comments: Labs today. Orders: -     Comprehensive metabolic panel -     Lipid panel  Class 3 severe obesity due to excess calories with serious comorbidity and body mass index (BMI) of 40.0 to 44.9 in adult [facility]) Comments: Reviewed the importance of diet and exercise during this time.  Chronic midline low back pain with left-sided sciatica Comments: Patient will be seeing her PMR physician this week.  Continuing Norco without any concerns.  Reviewed the importance of exercise and stretching.    . Orders and follow up as documented in EpicCare, reviewed diet, exercise and weight control, cardiovascular risk and specific lipid/LDL goals reviewed, reviewed medications and side effects in detail.  . Reviewed expectations re: course of current medical issues. . Outlined signs and symptoms indicating need for more acute intervention. . Patient verbalized understanding and all questions  were answered. . Patient received an After Visit Summary.  CMA served as Neurosurgeon during this visit. History, Physical, and Plan performed by medical provider. The above documentation has been reviewed and is accurate and complete. Helane Rima, D.O.  Helane Rima, DO [place] [place] [facility] 05/30/2019

## 2019-05-29 NOTE — Telephone Encounter (Signed)
Patient called regarding her Clonazepam 0.5mg . She stated that her pharmacy ran her insurance and said that the dosage (mg) needs to be increased but not the quantity, for the insurance to cover it. She stated she has been paying out of her pocket and picking up 2 15-day supplies to equal 30. She stated that the pharmacy told her that if she continues to take it 4 times a day at 0.5mg , a prior authorization would be needed. Please review and advise. Thank you.

## 2019-05-29 NOTE — Telephone Encounter (Signed)
We are writing Klonopin 0.5 mg to take 4 times a day #120.  If there is an issue with the pharmacy then pharmacy can send [place] a request for prior authorization.

## 2019-05-30 ENCOUNTER — Encounter: Payer: Self-pay | Admitting: Family Medicine

## 2019-05-30 LAB — CBC WITH DIFFERENTIAL/PLATELET
Basophils Absolute: 0.1 10*3/uL (ref 0.0–0.1)
Basophils Relative: 1.1 % (ref 0.0–3.0)
Eosinophils Absolute: 0.1 10*3/uL (ref 0.0–0.7)
Eosinophils Relative: 1.8 % (ref 0.0–5.0)
HCT: 37.3 % (ref 36.0–46.0)
Hemoglobin: 12.5 g/dL (ref 12.0–15.0)
Lymphocytes Relative: 32.4 % (ref 12.0–46.0)
Lymphs Abs: 2.6 10*3/uL (ref 0.7–4.0)
MCHC: 33.4 g/dL (ref 30.0–36.0)
MCV: 84.9 fl (ref 78.0–100.0)
Monocytes Absolute: 0.4 10*3/uL (ref 0.1–1.0)
Monocytes Relative: 5.5 % (ref 3.0–12.0)
Neutro Abs: 4.8 10*3/uL (ref 1.4–7.7)
Neutrophils Relative %: 59.2 % (ref 43.0–77.0)
Platelets: 200 10*3/uL (ref 150.0–400.0)
RBC: 4.4 Mil/uL (ref 3.87–5.11)
RDW: 15.3 % (ref 11.5–15.5)
WBC: 8.2 10*3/uL (ref 4.0–10.5)

## 2019-05-30 LAB — MICROALBUMIN / CREATININE URINE RATIO
Creatinine,U: 169 mg/dL
Microalb Creat Ratio: 1.3 mg/g (ref 0.0–30.0)
Microalb, Ur: 2.2 mg/dL — ABNORMAL HIGH (ref 0.0–1.9)

## 2019-05-30 LAB — LIPID PANEL
Cholesterol: 175 mg/dL (ref 0–200)
HDL: 50.6 mg/dL (ref >39.00)
LDL Cholesterol: 94 mg/dL (ref 0–99)
NonHDL: 124.79
Total CHOL/HDL Ratio: 3
Triglycerides: 155 mg/dL — ABNORMAL HIGH (ref 0.0–149.0)
VLDL: 31 mg/dL (ref 0.0–40.0)

## 2019-05-30 LAB — COMPREHENSIVE METABOLIC PANEL
ALT: 40 U/L — ABNORMAL HIGH (ref 0–35)
AST: 14 U/L (ref 0–37)
Albumin: 4.4 g/dL (ref 3.5–5.2)
Alkaline Phosphatase: 135 U/L — ABNORMAL HIGH (ref 39–117)
BUN: 11 mg/dL (ref 6–23)
CO2: 32 mEq/L (ref 19–32)
Calcium: 9.2 mg/dL (ref 8.4–10.5)
Chloride: 98 mEq/L (ref 96–112)
Creatinine, Ser: 0.64 mg/dL (ref 0.40–1.20)
GFR: 98.85 mL/min (ref >60.00)
Glucose, Bld: 182 mg/dL — ABNORMAL HIGH (ref 70–99)
Potassium: 4.9 mEq/L (ref 3.5–5.1)
Sodium: 136 mEq/L (ref 135–145)
Total Bilirubin: 0.3 mg/dL (ref 0.2–1.2)
Total Protein: 7.6 g/dL (ref 6.0–8.3)

## 2019-05-30 LAB — HEMOGLOBIN A1C: Hgb A1c MFr Bld: 7.5 % — ABNORMAL HIGH (ref 4.6–6.5)

## 2019-06-01 ENCOUNTER — Other Ambulatory Visit: Payer: Self-pay | Admitting: Family Medicine

## 2019-06-01 DIAGNOSIS — E118 Type 2 diabetes mellitus with unspecified complications: Secondary | ICD-10-CM

## 2019-06-01 DIAGNOSIS — Z794 Long term (current) use of insulin: Secondary | ICD-10-CM

## 2019-06-01 NOTE — Telephone Encounter (Signed)
I also spoke with patient to make her aware of the prescription for her Klonopin 1mg  that the pharmacy has on file.

## 2019-06-01 NOTE — Telephone Encounter (Signed)
I spoke with pharmacy regarding patient's Klonopin 0.5mg . They stated patient had already picked it up. I gave them a verbal order for Klonopin 1mg  Take 1/2 tablet 4 times a day #60 0 refills (per doctor) and they put it on file. Thank you.

## 2019-06-05 ENCOUNTER — Encounter: Payer: Self-pay | Admitting: Physical Medicine and Rehabilitation

## 2019-06-05 ENCOUNTER — Other Ambulatory Visit: Payer: Self-pay

## 2019-06-05 ENCOUNTER — Ambulatory Visit: Payer: Self-pay

## 2019-06-05 ENCOUNTER — Ambulatory Visit (INDEPENDENT_AMBULATORY_CARE_PROVIDER_SITE_OTHER): Payer: Medicare Other | Admitting: Physical Medicine and Rehabilitation

## 2019-06-05 VITALS — BP 115/76 | HR 81 | Temp 97.8°F

## 2019-06-05 DIAGNOSIS — M5416 Radiculopathy, lumbar region: Secondary | ICD-10-CM

## 2019-06-05 MED ORDER — BETAMETHASONE SOD PHOS & ACET 6 (3-3) MG/ML IJ SUSP: 12.0000 mg | Freq: Once | INTRAMUSCULAR | Status: DC

## 2019-06-05 NOTE — Progress Notes (Signed)
 .  Numeric Pain Rating Scale and Functional Assessment Average Pain 8   In the last MONTH (on 0-10 scale) has pain interfered with the following?  1. General activity like being  able to carry out your everyday physical activities such as walking, climbing stairs, carrying groceries, or moving a chair?  Rating(6)   +Driver, -BT, -Dye Allergies.  

## 2019-06-06 ENCOUNTER — Encounter: Payer: Self-pay | Admitting: Family Medicine

## 2019-06-06 ENCOUNTER — Other Ambulatory Visit: Payer: Self-pay | Admitting: Family Medicine

## 2019-06-06 DIAGNOSIS — G8929 Other chronic pain: Secondary | ICD-10-CM

## 2019-06-06 DIAGNOSIS — M5442 Lumbago with sciatica, left side: Secondary | ICD-10-CM

## 2019-06-07 MED ORDER — LIDOCAINE 5 % EX PTCH: 1.0000 | MEDICATED_PATCH | CUTANEOUS | 0 refills | Status: DC

## 2019-06-13 ENCOUNTER — Other Ambulatory Visit (HOSPITAL_COMMUNITY): Payer: Self-pay

## 2019-06-13 DIAGNOSIS — F410 Panic disorder [episodic paroxysmal anxiety] without agoraphobia: Secondary | ICD-10-CM

## 2019-06-13 MED ORDER — CLONAZEPAM 1 MG PO TABS: 0.5000 mg | ORAL_TABLET | Freq: Four times a day (QID) | ORAL | 0 refills | Status: DC | PRN

## 2019-06-18 ENCOUNTER — Other Ambulatory Visit: Payer: Self-pay | Admitting: Family Medicine

## 2019-06-20 NOTE — Progress Notes (Signed)
Jodi Wolfe - 48 y.o. female MRN [id number]  Date of birth: Dec 12, 1971  Office Visit Note: Visit Date: 06/05/2019 PCP: Briscoe Deutscher, DO Referred by: Briscoe Deutscher, DO  Subjective: Chief Complaint  Patient presents with  . Lower Back - Pain   HPI:  Jodi Wolfe is a 48 y.o. female who comes in today For planned repeat left L4-5 interlaminar epidural steroid injection.  Patient well-known to [place] and she is followed by Dr. Briscoe Deutscher.  She has chronic mostly axial back pain some referral to the buttock and hip but nothing really down the legs.  She has pretty severe arthritis at L4-5 lateral recess narrowing but not much in the way of central stenosis.  Her case is very complicated medically with type 2 diabetes which is insulin-dependent but she also has history of pretty extreme PTSD which I do think is a factor in her overall chronic pain complaints.  She does take opioids on a consistent basis which is been managed by her primary care physician Dr. Juleen China.  She does get decent relief with the injection is just not lasting very long and it starts to come back slowly.  I think it is fair to repeat this if she does feel like it gives her some functional relief.  We did try facet joint blocks which were just not very beneficial but that is something to think about again at some point and may be do that from a diagnostic medial branch block standpoint.  Otherwise depending on her pain status obviously she is a complicated chronic pain patient with the need for her concomitant behavioral health management.  ROS Otherwise per HPI.  Assessment & Plan: Visit Diagnoses:  1. Lumbar radiculopathy     Plan: No additional findings.   Meds & Orders:  Meds ordered this encounter  Medications  . betamethasone acetate-betamethasone sodium phosphate (CELESTONE) injection 12 mg    Orders Placed This Encounter  Procedures  . XR C-ARM NO REPORT  . Epidural Steroid injection    Follow-up: No  follow-ups on file.   Procedures: No procedures performed  Lumbar Epidural Steroid Injection - Interlaminar Approach with Fluoroscopic Guidance  Patient: Jodi Wolfe      Date of Birth: 01-03-1971 MRN: [id number] PCP: Briscoe Deutscher, DO      Visit Date: 06/05/2019   Universal Protocol:     Consent Given By: the patient  Position: PRONE  Additional Comments: Vital signs were monitored before and after the procedure. Patient was prepped and draped in the usual sterile fashion. The correct patient, procedure, and site was verified.   Injection Procedure Details:  Procedure Site One Meds Administered:  Meds ordered this encounter  Medications  . betamethasone acetate-betamethasone sodium phosphate (CELESTONE) injection 12 mg     Laterality: Left  Location/Site:  L4-L5  Needle size: 20 G  Needle type: Tuohy  Needle Placement: Paramedian epidural  Findings:   -Comments: Excellent flow of contrast into the epidural space.  Procedure Details: Using a paramedian approach from the side mentioned above, the region overlying the inferior lamina was localized under fluoroscopic visualization and the soft tissues overlying this structure were infiltrated with 4 ml. of 1% Lidocaine without Epinephrine. The Tuohy needle was inserted into the epidural space using a paramedian approach.   The epidural space was localized using loss of resistance along with lateral and bi-planar fluoroscopic views.  After negative aspirate for air, blood, and CSF, a 2 ml. volume of Isovue-250 was injected into the epidural  space and the flow of contrast was observed. Radiographs were obtained for documentation purposes.    The injectate was administered into the level noted above.   Additional Comments:  The patient tolerated the procedure well Dressing: 2 x 2 sterile gauze and Band-Aid    Post-procedure details: Patient was observed during the procedure. Post-procedure instructions were  reviewed.  Patient left the clinic in stable condition.    Clinical History: MRI LUMBAR SPINE WITHOUT CONTRAST  TECHNIQUE: Multiplanar, multisequence MR imaging of the lumbar spine was performed. No intravenous contrast was administered.  COMPARISON:  None.  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 L5-S1 disc is rudimentary.  Alignment: 3 mm anterolisthesis of L4 on L5. Exaggeration of the normal lumbar lordosis. Trace dextroscoliosis.  Vertebrae: Vertebral body heights maintained without evidence for acute or chronic fracture. Bone marrow signal intensity within normal limits. No discrete or worrisome osseous lesions.  Conus medullaris and cauda equina: Conus extends to the T12-L1 level. Conus and cauda equina appear normal.  Paraspinal and other soft tissues: Paraspinous soft tissues demonstrate no acute abnormality. Scattered parapelvic cysts noted within left kidney. Visualized visceral structures otherwise unremarkable.  Disc levels:  L1-2: Normal interspace. Mild facet and ligamentum flavum hypertrophy. No stenosis.  L2-3: Normal interspace. Mild to moderate facet and ligament flavum hypertrophy. No stenosis.  L3-4: Minimal annular disc bulge with disc desiccation. Mild to moderate facet and ligament flavum hypertrophy. No stenosis.  L4-5: 3 mm anterolisthesis. Disc desiccation without significant disc bulge. Associated severe facet arthrosis. No significant canal or neural foraminal narrowing. No impingement.  L5-S1: Transitional lumbosacral anatomy with rudimentary L5-S1 disc. No stenosis or impingement.  IMPRESSION: 1. 3 mm anterolisthesis of L4 on L5 with associated severe bilateral facet arthropathy. 2. Additional mild to moderate facet arthropathy at L1-2 thru L3-4. 3. No significant canal or neural foraminal stenosis within the lumbar spine. No impingement. 4. 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   Electronically Signed   By: Rise MuBenjamin  McClintock M.D.   On: 12/17/2017 23:22     Objective:  VS:  HT:    WT:   BMI:     BP:115/76  HR:81bpm  TEMP:97.8 F (36.6 C)( )  RESP:97 % Physical Exam  Ortho Exam Imaging: No results found.

## 2019-06-20 NOTE — Procedures (Signed)
Lumbar Epidural Steroid Injection - Interlaminar Approach with Fluoroscopic Guidance  Patient: Jodi Wolfe      Date of Birth: May 28, 1971 MRN: [id number] PCP: Briscoe Deutscher, DO      Visit Date: 06/05/2019   Universal Protocol:     Consent Given By: the patient  Position: PRONE  Additional Comments: Vital signs were monitored before and after the procedure. Patient was prepped and draped in the usual sterile fashion. The correct patient, procedure, and site was verified.   Injection Procedure Details:  Procedure Site One Meds Administered:  Meds ordered this encounter  Medications  . betamethasone acetate-betamethasone sodium phosphate (CELESTONE) injection 12 mg     Laterality: Left  Location/Site:  L4-L5  Needle size: 20 G  Needle type: Tuohy  Needle Placement: Paramedian epidural  Findings:   -Comments: Excellent flow of contrast into the epidural space.  Procedure Details: Using a paramedian approach from the side mentioned above, the region overlying the inferior lamina was localized under fluoroscopic visualization and the soft tissues overlying this structure were infiltrated with 4 ml. of 1% Lidocaine without Epinephrine. The Tuohy needle was inserted into the epidural space using a paramedian approach.   The epidural space was localized using loss of resistance along with lateral and bi-planar fluoroscopic views.  After negative aspirate for air, blood, and CSF, a 2 ml. volume of Isovue-250 was injected into the epidural space and the flow of contrast was observed. Radiographs were obtained for documentation purposes.    The injectate was administered into the level noted above.   Additional Comments:  The patient tolerated the procedure well Dressing: 2 x 2 sterile gauze and Band-Aid    Post-procedure details: Patient was observed during the procedure. Post-procedure instructions were reviewed.  Patient left the clinic in stable condition.

## 2019-06-21 ENCOUNTER — Other Ambulatory Visit: Payer: Self-pay | Admitting: Family Medicine

## 2019-06-21 DIAGNOSIS — E118 Type 2 diabetes mellitus with unspecified complications: Secondary | ICD-10-CM

## 2019-06-21 DIAGNOSIS — Z794 Long term (current) use of insulin: Secondary | ICD-10-CM

## 2019-06-23 ENCOUNTER — Other Ambulatory Visit: Payer: Self-pay

## 2019-06-23 ENCOUNTER — Telehealth: Payer: Self-pay | Admitting: Family Medicine

## 2019-06-23 DIAGNOSIS — G8929 Other chronic pain: Secondary | ICD-10-CM

## 2019-06-23 DIAGNOSIS — M5432 Sciatica, left side: Secondary | ICD-10-CM

## 2019-06-23 DIAGNOSIS — M5442 Lumbago with sciatica, left side: Secondary | ICD-10-CM

## 2019-06-23 MED ORDER — HYDROCODONE-ACETAMINOPHEN 10-325 MG PO TABS: 1.0000 | ORAL_TABLET | Freq: Three times a day (TID) | ORAL | 0 refills | Status: DC | PRN

## 2019-06-23 NOTE — Telephone Encounter (Signed)
Medication: HYDROcodone-acetaminophen (NORCO) 10-325 MG tablet -pt will be out of town and will not be able to get her refill on Sunday. Pt is requesting refill sent in today if possible. Please advise   Has the patient contacted their pharmacy? Yes  (Agent: If no, request that the patient contact the pharmacy for the refill.) (Agent: If yes, when and what did the pharmacy advise?)  Preferred Pharmacy (with phone number or street name): [id number] [place], [place] - [address] [place] [address] Phone: [phone] Fax: [phone]    Agent: Please be advised that RX refills may take up to 3 business days. We ask that you follow-up with your pharmacy.

## 2019-06-23 NOTE — Telephone Encounter (Signed)
Copied from [place] [id number]. Topic: General - Call Back - No Documentation >> Jun 23, 2019  9:44 AM Erick Blinks wrote: Reason for CRM: Pt requesting paper script for medication refill (See TE) She is leaving to go out of town at 1 pm. Please advise Best Contact: [phone]

## 2019-06-23 NOTE — Telephone Encounter (Signed)
Pt told Rx sent to pharmacy.

## 2019-07-02 ENCOUNTER — Other Ambulatory Visit: Payer: Self-pay | Admitting: Family Medicine

## 2019-07-02 ENCOUNTER — Other Ambulatory Visit (HOSPITAL_COMMUNITY): Payer: Self-pay | Admitting: Psychiatry

## 2019-07-02 DIAGNOSIS — E118 Type 2 diabetes mellitus with unspecified complications: Secondary | ICD-10-CM

## 2019-07-02 DIAGNOSIS — F4310 Post-traumatic stress disorder, unspecified: Secondary | ICD-10-CM

## 2019-07-02 DIAGNOSIS — F3131 Bipolar disorder, current episode depressed, mild: Secondary | ICD-10-CM

## 2019-07-02 DIAGNOSIS — Z794 Long term (current) use of insulin: Secondary | ICD-10-CM

## 2019-07-05 ENCOUNTER — Other Ambulatory Visit (HOSPITAL_COMMUNITY): Payer: Self-pay

## 2019-07-05 DIAGNOSIS — F410 Panic disorder [episodic paroxysmal anxiety] without agoraphobia: Secondary | ICD-10-CM

## 2019-07-05 MED ORDER — CLONAZEPAM 1 MG PO TABS: 0.5000 mg | ORAL_TABLET | Freq: Four times a day (QID) | ORAL | 0 refills | Status: DC | PRN

## 2019-07-13 ENCOUNTER — Other Ambulatory Visit: Payer: Self-pay | Admitting: Family Medicine

## 2019-07-13 DIAGNOSIS — M5432 Sciatica, left side: Secondary | ICD-10-CM

## 2019-07-17 ENCOUNTER — Encounter: Payer: Self-pay | Admitting: Family Medicine

## 2019-07-17 DIAGNOSIS — G8929 Other chronic pain: Secondary | ICD-10-CM

## 2019-07-17 DIAGNOSIS — M5442 Lumbago with sciatica, left side: Secondary | ICD-10-CM

## 2019-07-17 MED ORDER — HYDROCODONE-ACETAMINOPHEN 10-325 MG PO TABS: 1.0000 | ORAL_TABLET | Freq: Three times a day (TID) | ORAL | 0 refills | Status: DC | PRN

## 2019-07-17 MED ORDER — HYDROCODONE-ACETAMINOPHEN 10-325 MG PO TABS: ORAL_TABLET | ORAL | 0 refills | Status: DC

## 2019-07-17 NOTE — Telephone Encounter (Signed)
Dr. Juleen China, refill dates on Rx's for Hydrocodone were incorrect. I have cancelled Rx's for July and August. I have loaded them again for you to send. Thanks.

## 2019-07-17 NOTE — Telephone Encounter (Signed)
[facility] [place] asked him to cancel Rx's for Hydrocodone for 7/30 & 8/30 refills need to change date will have Dr. Juleen China resubmit Rx's Darnelle Maffucci verbalized understanding.

## 2019-07-18 ENCOUNTER — Telehealth: Payer: Self-pay | Admitting: Family Medicine

## 2019-07-18 NOTE — Telephone Encounter (Signed)
I spoke with patient to inform her that her refill is coming up 07/22/19.

## 2019-07-18 NOTE — Telephone Encounter (Signed)
Medication Refill - Medication: HYDROcodone-acetaminophen (NORCO) 10-325 MG tablet    Preferred Pharmacy (with phone number or street name):  [id number] - [place], [place] [phone] (Phone) [phone] (Fax)

## 2019-07-19 ENCOUNTER — Other Ambulatory Visit: Payer: Self-pay | Admitting: Family Medicine

## 2019-07-19 ENCOUNTER — Other Ambulatory Visit (HOSPITAL_COMMUNITY): Payer: Self-pay

## 2019-07-19 DIAGNOSIS — E118 Type 2 diabetes mellitus with unspecified complications: Secondary | ICD-10-CM

## 2019-07-19 DIAGNOSIS — F3131 Bipolar disorder, current episode depressed, mild: Secondary | ICD-10-CM

## 2019-07-19 DIAGNOSIS — F4310 Post-traumatic stress disorder, unspecified: Secondary | ICD-10-CM

## 2019-07-19 DIAGNOSIS — G8929 Other chronic pain: Secondary | ICD-10-CM

## 2019-07-19 DIAGNOSIS — Z794 Long term (current) use of insulin: Secondary | ICD-10-CM

## 2019-07-19 MED ORDER — LAMOTRIGINE 200 MG PO TABS: 200.0000 mg | ORAL_TABLET | Freq: Every day | ORAL | 0 refills | Status: DC

## 2019-07-19 MED ORDER — CHLORPROMAZINE HCL 50 MG PO TABS: ORAL_TABLET | ORAL | 0 refills | Status: DC

## 2019-07-20 NOTE — Telephone Encounter (Signed)
Last fill Zofran 07/03/19  #20/0 Last fill patch  06/07/19  #30/0 Last ov  05/29/19

## 2019-07-28 ENCOUNTER — Other Ambulatory Visit: Payer: Self-pay | Admitting: Family Medicine

## 2019-07-28 DIAGNOSIS — E118 Type 2 diabetes mellitus with unspecified complications: Secondary | ICD-10-CM

## 2019-07-28 DIAGNOSIS — Z794 Long term (current) use of insulin: Secondary | ICD-10-CM

## 2019-07-31 ENCOUNTER — Other Ambulatory Visit (HOSPITAL_COMMUNITY): Payer: Self-pay

## 2019-07-31 DIAGNOSIS — F410 Panic disorder [episodic paroxysmal anxiety] without agoraphobia: Secondary | ICD-10-CM

## 2019-07-31 MED ORDER — CLONAZEPAM 1 MG PO TABS: 0.5000 mg | ORAL_TABLET | Freq: Four times a day (QID) | ORAL | 0 refills | Status: DC | PRN

## 2019-08-07 ENCOUNTER — Encounter: Payer: Self-pay | Admitting: Family Medicine

## 2019-08-07 ENCOUNTER — Other Ambulatory Visit: Payer: Self-pay

## 2019-08-07 ENCOUNTER — Telehealth: Payer: Self-pay | Admitting: Physical Medicine and Rehabilitation

## 2019-08-07 ENCOUNTER — Telehealth: Payer: Self-pay | Admitting: Family Medicine

## 2019-08-07 DIAGNOSIS — G8929 Other chronic pain: Secondary | ICD-10-CM

## 2019-08-07 DIAGNOSIS — M5442 Lumbago with sciatica, left side: Secondary | ICD-10-CM

## 2019-08-07 NOTE — Telephone Encounter (Signed)
Copied from [place] [id number]. Topic: General - Other >> Aug 07, 2019  4:32 PM Rainey Pines A wrote: Patient would like a callback from Dr. Juleen China nurse in regards to her hydrocodone medication. Patient said that her earliest fill date should be 8/23

## 2019-08-07 NOTE — Telephone Encounter (Signed)
Pt is scheduled with driver 1/85/9093

## 2019-08-07 NOTE — Telephone Encounter (Signed)
Could try bilateral facets, L4-5 otherwise not much to offer, could redo MRI if needed last was 2018

## 2019-08-07 NOTE — Telephone Encounter (Signed)
Pts Rx for HYDROcodone-acetaminophen (NORCO) 10-325 MG tablet  was sent to the pharmacy on 7.20.20 with an earliest fill date of 7.25.20 but the Pt states the Pharmacy filled it for her and she picked it up on 7.23.20. her next refill is due for 8.25.20 but she will be out of medication 2 days before that date. Please advise

## 2019-08-07 NOTE — Telephone Encounter (Signed)
Can you call to schedule 

## 2019-08-08 MED ORDER — HYDROCODONE-ACETAMINOPHEN 10-325 MG PO TABS: 1.0000 | ORAL_TABLET | Freq: Three times a day (TID) | ORAL | 0 refills | Status: DC | PRN

## 2019-08-08 NOTE — Telephone Encounter (Signed)
Looked on database patient will be due for refill on 8/23 pulled up will need to send new script so pharmacy will fill when due.

## 2019-08-08 NOTE — Telephone Encounter (Signed)
Duplicate message has been sent to provider already

## 2019-08-08 NOTE — Telephone Encounter (Signed)
Message sent to wallace this is duplicate.

## 2019-08-08 NOTE — Addendum Note (Signed)
Addended by: Francella Solian on: 08/08/2019 10:10 AM   Modules accepted: Orders

## 2019-08-25 ENCOUNTER — Other Ambulatory Visit: Payer: Self-pay | Admitting: Family Medicine

## 2019-08-25 DIAGNOSIS — G8929 Other chronic pain: Secondary | ICD-10-CM

## 2019-08-28 ENCOUNTER — Other Ambulatory Visit (HOSPITAL_COMMUNITY): Payer: Self-pay

## 2019-08-28 ENCOUNTER — Encounter: Payer: Medicare Other | Admitting: Physical Medicine and Rehabilitation

## 2019-08-28 DIAGNOSIS — F410 Panic disorder [episodic paroxysmal anxiety] without agoraphobia: Secondary | ICD-10-CM

## 2019-08-28 MED ORDER — CLONAZEPAM 1 MG PO TABS: 0.5000 mg | ORAL_TABLET | Freq: Four times a day (QID) | ORAL | 0 refills | Status: DC | PRN

## 2019-08-29 NOTE — Telephone Encounter (Signed)
Last fill 07/22/19  #30/0 Last OV 05/29/19 Ok to fill?

## 2019-09-08 ENCOUNTER — Other Ambulatory Visit: Payer: Self-pay | Admitting: Family Medicine

## 2019-09-08 DIAGNOSIS — E118 Type 2 diabetes mellitus with unspecified complications: Secondary | ICD-10-CM

## 2019-09-08 DIAGNOSIS — Z794 Long term (current) use of insulin: Secondary | ICD-10-CM

## 2019-09-08 NOTE — Telephone Encounter (Signed)
Last fill 07/22/19  #20/0 Last OV 05/29/19

## 2019-09-19 ENCOUNTER — Telehealth: Payer: Self-pay | Admitting: Family Medicine

## 2019-09-19 ENCOUNTER — Encounter: Payer: Self-pay | Admitting: Family Medicine

## 2019-09-19 DIAGNOSIS — G8929 Other chronic pain: Secondary | ICD-10-CM

## 2019-09-19 MED ORDER — HYDROCODONE-ACETAMINOPHEN 10-325 MG PO TABS: 1.0000 | ORAL_TABLET | Freq: Three times a day (TID) | ORAL | 0 refills | Status: DC | PRN

## 2019-09-19 NOTE — Telephone Encounter (Signed)
Message sent to Dr. Juleen China will let patient know when we get answer.

## 2019-09-19 NOTE — Telephone Encounter (Signed)
Patient called in to check on status of pharmacy change. Please advise as patient stated she will call back after lunch to check.

## 2019-09-19 NOTE — Telephone Encounter (Signed)
Patient requesting HYDROcodone-acetaminophen ([place]) 10-325 MG tablet please send [facility] pharmacy  [facility] [address] [place]  [phone]. Patient states where the Rx was initially sent is out of stock. Patient has 1 pill left and would like Rx transferred to new pharmacy today with a follow up call.

## 2019-09-19 NOTE — Telephone Encounter (Signed)
Ok to call in? Have pulled up under right pharmacy

## 2019-09-24 ENCOUNTER — Other Ambulatory Visit: Payer: Self-pay | Admitting: Family Medicine

## 2019-09-24 DIAGNOSIS — G8929 Other chronic pain: Secondary | ICD-10-CM

## 2019-09-24 DIAGNOSIS — M5442 Lumbago with sciatica, left side: Secondary | ICD-10-CM

## 2019-09-25 ENCOUNTER — Encounter: Payer: Medicare Other | Admitting: Physical Medicine and Rehabilitation

## 2019-09-26 ENCOUNTER — Other Ambulatory Visit (HOSPITAL_COMMUNITY): Payer: Self-pay

## 2019-09-26 DIAGNOSIS — F410 Panic disorder [episodic paroxysmal anxiety] without agoraphobia: Secondary | ICD-10-CM

## 2019-09-26 MED ORDER — CLONAZEPAM 1 MG PO TABS: 0.5000 mg | ORAL_TABLET | Freq: Four times a day (QID) | ORAL | 0 refills | Status: DC | PRN

## 2019-09-28 NOTE — Telephone Encounter (Signed)
Error

## 2019-10-01 ENCOUNTER — Other Ambulatory Visit (HOSPITAL_COMMUNITY): Payer: Self-pay | Admitting: Psychiatry

## 2019-10-01 DIAGNOSIS — F3131 Bipolar disorder, current episode depressed, mild: Secondary | ICD-10-CM

## 2019-10-01 DIAGNOSIS — F4310 Post-traumatic stress disorder, unspecified: Secondary | ICD-10-CM

## 2019-10-03 ENCOUNTER — Other Ambulatory Visit (HOSPITAL_COMMUNITY): Payer: Self-pay

## 2019-10-03 ENCOUNTER — Other Ambulatory Visit: Payer: Self-pay | Admitting: Family Medicine

## 2019-10-03 ENCOUNTER — Other Ambulatory Visit (HOSPITAL_COMMUNITY): Payer: Self-pay | Admitting: Psychiatry

## 2019-10-03 DIAGNOSIS — F3131 Bipolar disorder, current episode depressed, mild: Secondary | ICD-10-CM

## 2019-10-03 DIAGNOSIS — M5432 Sciatica, left side: Secondary | ICD-10-CM

## 2019-10-03 MED ORDER — CHLORPROMAZINE HCL 50 MG PO TABS: ORAL_TABLET | ORAL | 0 refills | Status: DC

## 2019-10-03 NOTE — Telephone Encounter (Signed)
Last fill 07/17/19  #405/0 Last OV 05/29/19 Ok to fill? Next OV 10/18/19

## 2019-10-06 ENCOUNTER — Encounter: Payer: Medicare Other | Admitting: Family Medicine

## 2019-10-18 ENCOUNTER — Ambulatory Visit (INDEPENDENT_AMBULATORY_CARE_PROVIDER_SITE_OTHER): Payer: Medicare Other | Admitting: Family Medicine

## 2019-10-18 ENCOUNTER — Other Ambulatory Visit: Payer: Self-pay

## 2019-10-18 ENCOUNTER — Encounter: Payer: Self-pay | Admitting: Family Medicine

## 2019-10-18 VITALS — BP 110/74 | HR 94 | Temp 97.9°F | Ht 64.0 in | Wt 216.0 lb

## 2019-10-18 DIAGNOSIS — Z794 Long term (current) use of insulin: Secondary | ICD-10-CM | POA: Diagnosis not present

## 2019-10-18 DIAGNOSIS — F319 Bipolar disorder, unspecified: Secondary | ICD-10-CM | POA: Diagnosis not present

## 2019-10-18 DIAGNOSIS — Z6841 Body Mass Index (BMI) 40.0 and over, adult: Secondary | ICD-10-CM

## 2019-10-18 DIAGNOSIS — E785 Hyperlipidemia, unspecified: Secondary | ICD-10-CM

## 2019-10-18 DIAGNOSIS — M5442 Lumbago with sciatica, left side: Secondary | ICD-10-CM | POA: Diagnosis not present

## 2019-10-18 DIAGNOSIS — G8929 Other chronic pain: Secondary | ICD-10-CM | POA: Diagnosis not present

## 2019-10-18 DIAGNOSIS — R1013 Epigastric pain: Secondary | ICD-10-CM

## 2019-10-18 DIAGNOSIS — E118 Type 2 diabetes mellitus with unspecified complications: Secondary | ICD-10-CM

## 2019-10-18 DIAGNOSIS — N951 Menopausal and female climacteric states: Secondary | ICD-10-CM

## 2019-10-18 DIAGNOSIS — E1169 Type 2 diabetes mellitus with other specified complication: Secondary | ICD-10-CM

## 2019-10-18 DIAGNOSIS — Z0000 Encounter for general adult medical examination without abnormal findings: Secondary | ICD-10-CM

## 2019-10-18 DIAGNOSIS — F5104 Psychophysiologic insomnia: Secondary | ICD-10-CM | POA: Diagnosis not present

## 2019-10-18 LAB — CBC WITH DIFFERENTIAL/PLATELET
Basophils Absolute: 0 10*3/uL (ref 0.0–0.1)
Basophils Relative: 0.1 % (ref 0.0–3.0)
Eosinophils Absolute: 0.1 10*3/uL (ref 0.0–0.7)
Eosinophils Relative: 1.4 % (ref 0.0–5.0)
HCT: 38.8 % (ref 36.0–46.0)
Hemoglobin: 12.9 g/dL (ref 12.0–15.0)
Lymphocytes Relative: 25.7 % (ref 12.0–46.0)
Lymphs Abs: 1.7 10*3/uL (ref 0.7–4.0)
MCHC: 33.2 g/dL (ref 30.0–36.0)
MCV: 87.2 fl (ref 78.0–100.0)
Monocytes Absolute: 0.4 10*3/uL (ref 0.1–1.0)
Monocytes Relative: 5.9 % (ref 3.0–12.0)
Neutro Abs: 4.5 10*3/uL (ref 1.4–7.7)
Neutrophils Relative %: 66.9 % (ref 43.0–77.0)
Platelets: 233 10*3/uL (ref 150.0–400.0)
RBC: 4.45 Mil/uL (ref 3.87–5.11)
RDW: 15.3 % (ref 11.5–15.5)
WBC: 6.8 10*3/uL (ref 4.0–10.5)

## 2019-10-18 LAB — COMPREHENSIVE METABOLIC PANEL
ALT: 14 U/L (ref 0–35)
AST: 14 U/L (ref 0–37)
Albumin: 4.8 g/dL (ref 3.5–5.2)
Alkaline Phosphatase: 112 U/L (ref 39–117)
BUN: 15 mg/dL (ref 6–23)
CO2: 32 mEq/L (ref 19–32)
Calcium: 9.7 mg/dL (ref 8.4–10.5)
Chloride: 97 mEq/L (ref 96–112)
Creatinine, Ser: 0.74 mg/dL (ref 0.40–1.20)
GFR: 83.47 mL/min (ref >60.00)
Glucose, Bld: 170 mg/dL — ABNORMAL HIGH (ref 70–99)
Potassium: 5 mEq/L (ref 3.5–5.1)
Sodium: 138 mEq/L (ref 135–145)
Total Bilirubin: 0.5 mg/dL (ref 0.2–1.2)
Total Protein: 7.9 g/dL (ref 6.0–8.3)

## 2019-10-18 LAB — HEMOGLOBIN A1C: Hgb A1c MFr Bld: 6.9 % — ABNORMAL HIGH (ref 4.6–6.5)

## 2019-10-18 MED ORDER — ONDANSETRON 4 MG PO TBDP: ORAL_TABLET | ORAL | 0 refills | Status: DC

## 2019-10-18 MED ORDER — GABAPENTIN 600 MG PO TABS: 900.0000 mg | ORAL_TABLET | Freq: Three times a day (TID) | ORAL | 0 refills | Status: DC

## 2019-10-18 MED ORDER — DAPAGLIFLOZIN PROPANEDIOL 10 MG PO TABS: 10.0000 mg | ORAL_TABLET | Freq: Every day | ORAL | 3 refills | Status: DC

## 2019-10-18 MED ORDER — NAPROXEN 500 MG PO TABS: 250.0000 mg | ORAL_TABLET | Freq: Two times a day (BID) | ORAL | 3 refills | Status: DC

## 2019-10-18 MED ORDER — BLOOD GLUCOSE MONITOR KIT: PACK | 0 refills | Status: DC

## 2019-10-18 MED ORDER — HYDROCODONE-ACETAMINOPHEN 10-325 MG PO TABS: 1.0000 | ORAL_TABLET | Freq: Three times a day (TID) | ORAL | 0 refills | Status: DC | PRN

## 2019-10-18 MED ORDER — ATORVASTATIN CALCIUM 10 MG PO TABS: 10.0000 mg | ORAL_TABLET | Freq: Every day | ORAL | 1 refills | Status: DC

## 2019-10-18 MED ORDER — OZEMPIC (1 MG/DOSE) 2 MG/1.5ML ~~LOC~~ SOPN: 1.0000 "pen " | PEN_INJECTOR | SUBCUTANEOUS | 3 refills | Status: DC

## 2019-10-18 MED ORDER — DEXLANSOPRAZOLE 60 MG PO CPDR: 60.0000 mg | DELAYED_RELEASE_CAPSULE | Freq: Every day | ORAL | 6 refills | Status: DC

## 2019-10-18 MED ORDER — LIDOCAINE 5 % EX PTCH: 1.0000 | MEDICATED_PATCH | CUTANEOUS | 0 refills | Status: DC

## 2019-10-18 MED ORDER — HYDROCODONE-ACETAMINOPHEN 10-325 MG PO TABS: ORAL_TABLET | ORAL | 0 refills | Status: DC

## 2019-10-18 MED ORDER — HYDROCODONE-ACETAMINOPHEN 10-325 MG PO TABS: 1.0000 | ORAL_TABLET | Freq: Three times a day (TID) | ORAL | 0 refills | Status: AC | PRN

## 2019-10-18 NOTE — Progress Notes (Signed)
Subjective:    Jodi Wolfe is a 48 y.o. female and is here for a comprehensive physical exam.  She will transferring care to Dr. Jonni Sanger at [facility].  Patient with chronic back pain, left sciatica. Rx Norco, Aleve, Gabapentin, Lidocaine patch, ESI. Has not been able to get ESI for several months due to lack of transportation. Pain worsening.   Diabetes, on insulin. Maxed Ozempic, still taking Insulin daily. Drinking lemonade. Not exercising. Intolerant to Metformin - diarrhea. Jodi Wolfe was not covered by insurance.   There are no preventive care reminders to display for this patient.  Current Outpatient Medications:  .  atorvastatin (LIPITOR) 10 MG tablet, Take 1 tablet (10 mg total) by mouth daily., Disp: 90 tablet, Rfl: 1 .  chlorproMAZINE (THORAZINE) 50 MG tablet, TAKE 1 TABLET BY MOUTH EVERYDAY AT BEDTIME, Disp: 90 tablet, Rfl: 0 .  clonazePAM (KLONOPIN) 1 MG tablet, Take 0.5 tablets (0.5 mg total) by mouth 4 (four) times daily as needed for anxiety., Disp: 60 tablet, Rfl: 0 .  dexlansoprazole (DEXILANT) 60 MG capsule, Take 1 capsule (60 mg total) by mouth daily., Disp: 30 capsule, Rfl: 6 .  gabapentin (NEURONTIN) 600 MG tablet, Take 1.5 tablets (900 mg total) by mouth 3 (three) times daily., Disp: 405 tablet, Rfl: 0 .  [START ON 11/17/2019] HYDROcodone-acetaminophen (NORCO) 10-325 MG tablet, Take 1 tablet by mouth every 8 (eight) hours as needed for severe pain (Chronic pain)., Disp: 90 tablet, Rfl: 0 .  HYDROcodone-acetaminophen (NORCO) 10-325 MG tablet, 1 tab every 8 hours as needed for severe pain., Disp: 90 tablet, Rfl: 0 .  lamoTRIgine (LAMICTAL) 200 MG tablet, Take 1 tablet (200 mg total) by mouth daily., Disp: 90 tablet, Rfl: 0 .  lidocaine (LIDODERM) 5 %, Place 1 patch onto the skin daily. Remove & Discard patch within 12 hours or as directed by MD, Disp: 30 patch, Rfl: 0 .  naproxen (NAPROSYN) 500 MG tablet, Take 0.5 tablets (250 mg total) by mouth 2 (two) times daily with a meal.,  Disp: 90 tablet, Rfl: 3 .  ondansetron (ZOFRAN-ODT) 4 MG disintegrating tablet, TAKE 1 TABLET BY MOUTH EVERY 8 HOURS AS NEEDED FOR NAUSEA AND VOMITING, Disp: 20 tablet, Rfl: 0 .  Semaglutide, 1 MG/DOSE, (OZEMPIC, 1 MG/DOSE,) 2 MG/1.5ML SOPN, Inject 1 pen into the skin once a week., Disp: 4 pen, Rfl: 3 .  blood glucose meter kit and supplies KIT, Dispense based on patient and insurance preference. Use up to four times daily as directed. (FOR ICD-9 250.00, 250.01)., Disp: 1 each, Rfl: 0 .  dapagliflozin propanediol (FARXIGA) 10 MG TABS tablet, Take 10 mg by mouth daily before breakfast., Disp: 30 tablet, Rfl: 3 .  HYDROcodone-acetaminophen (NORCO) 10-325 MG tablet, Take 1 tablet by mouth every 8 (eight) hours as needed., Disp: 90 tablet, Rfl: 0  PMHx, SurgHx, SocialHx, Medications, and Allergies were reviewed in the Visit Navigator and updated as appropriate.   Past Medical History:  Diagnosis Date  . Anxiety   . Depression   . Diabetes mellitus without complication ([place])   . GERD (gastroesophageal reflux disease)   . Headache   . Neuropathy      Past Surgical History:  Procedure Laterality Date  . APPENDECTOMY    . CHOLECYSTECTOMY    . TONSILLECTOMY AND ADENOIDECTOMY       Family History  Problem Relation Age of Onset  . Hyperlipidemia Mother   . Hypertension Mother   . Stroke Mother   . Depression Mother   .  Drug abuse Mother   . Hyperlipidemia Father   . Hypertension Father   . Stroke Father   . Bipolar disorder Father   . Hyperlipidemia Brother   . Hypertension Brother   . Anxiety disorder Brother    Social History   Tobacco Use  . Smoking status: Never Smoker  . Smokeless tobacco: Never Used  Substance Use Topics  . Alcohol use: No  . Drug use: No    Review of Systems:   Pertinent items are noted in the HPI. Otherwise, ROS is negative.  Objective:   BP 110/74   Pulse 94   Temp 97.9 F (36.6 C) (Temporal)   Ht _0  (1.626 m)   Wt 216 lb (98 kg)    SpO2 96%   BMI 37.08 kg/m   General appearance: alert, cooperative and appears stated age. Head: normocephalic, without obvious abnormality, atraumatic. Neck: no adenopathy, supple, symmetrical, trachea midline; thyroid not enlarged, symmetric, no tenderness/mass/nodules. Lungs: clear to auscultation bilaterally. Heart: regular rate and rhythm Abdomen: soft, non-tender; no masses,  no organomegaly. Extremities: extremities normal, atraumatic, no cyanosis or edema. Skin: skin color, texture, turgor normal, no rashes or lesions. Lymph: cervical, supraclavicular, and axillary nodes normal; no abnormal inguinal nodes palpated. Neurologic: grossly normal.                                 Diabetic Foot Exam - Simple   Simple Foot Form Diabetic Foot exam was performed with the following findings: Yes 10/18/2019 10:12 AM  Visual Inspection No deformities, no ulcerations, no other skin breakdown bilaterally: Yes Sensation Testing Intact to touch and monofilament testing bilaterally: Yes Pulse Check Posterior Tibialis and Dorsalis pulse intact bilaterally: Yes Comments          Assessment/Plan:   Jodi Wolfe was seen today for follow-up.  Diagnoses and all orders for this visit:  Routine physical examination  Type 2 diabetes mellitus with complication, with long-term current use of insulin (HCC) -     CBC with Differential/Platelet -     Comprehensive metabolic panel -     Hemoglobin A1c -     blood glucose meter kit and supplies KIT; Dispense based on patient and insurance preference. Use up to four times daily as directed. (FOR ICD-9 250.00, 250.01). -     ondansetron (ZOFRAN-ODT) 4 MG disintegrating tablet; TAKE 1 TABLET BY MOUTH EVERY 8 HOURS AS NEEDED FOR NAUSEA AND VOMITING -     Semaglutide, 1 MG/DOSE, (OZEMPIC, 1 MG/DOSE,) 2 MG/1.5ML SOPN; Inject 1 pen into the skin once a week. -     dapagliflozin propanediol (FARXIGA) 10 MG TABS tablet; Take 10 mg by mouth daily before  breakfast.  Hyperlipidemia associated with type 2 diabetes mellitus (HCC)  Chronic midline low back pain with left-sided sciatica -     naproxen (NAPROSYN) 500 MG tablet; Take 0.5 tablets (250 mg total) by mouth 2 (two) times daily with a meal. -     HYDROcodone-acetaminophen (NORCO) 10-325 MG tablet; Take 1 tablet by mouth every 8 (eight) hours as needed for severe pain (Chronic pain). -     lidocaine (LIDODERM) 5 %; Place 1 patch onto the skin daily. Remove & Discard patch within 12 hours or as directed by MD -     gabapentin (NEURONTIN) 600 MG tablet; Take 1.5 tablets (900 mg total) by mouth 3 (three) times daily. -     HYDROcodone-acetaminophen (NORCO) 10-325 MG  tablet; 1 tab every 8 hours as needed for severe pain. -     HYDROcodone-acetaminophen (NORCO) 10-325 MG tablet; Take 1 tablet by mouth every 8 (eight) hours as needed. -     Ambulatory referral to Pain Clinic  Class 3 severe obesity due to excess calories with serious comorbidity and body mass index (BMI) of 40.0 to 44.9 in adult [facility])  Psychophysiological insomnia  Bipolar depression ([place]), followed by Psychiatry  Dyspepsia Comments: Worse. See orders.  Orders: -     dexlansoprazole (DEXILANT) 60 MG capsule; Take 1 capsule (60 mg total) by mouth daily.  Perimenopausal vasomotor symptoms  Other orders -     atorvastatin (LIPITOR) 10 MG tablet; Take 1 tablet (10 mg total) by mouth daily.   Patient Counseling: _0    Nutrition: Stressed importance of moderation in sodium/caffeine intake, saturated fat and cholesterol, caloric balance, sufficient intake of fresh fruits, vegetables, fiber, calcium, iron, and 1 mg of folate supplement per day (for females capable of pregnancy).  _1    Stressed the importance of regular exercise.   _2    Substance Abuse: Discussed cessation/primary prevention of tobacco, alcohol, or other drug use; driving or other dangerous activities under the influence; availability of treatment for abuse.    _3    Injury prevention: Discussed safety belts, safety helmets, smoke detector, smoking near bedding or upholstery.   _4    Sexuality: Discussed sexually transmitted diseases, partner selection, use of condoms, avoidance of unintended pregnancy  and contraceptive alternatives.  _5    Dental health: Discussed importance of regular tooth brushing, flossing, and dental visits.  _6    Health maintenance and immunizations reviewed. Please refer to Health maintenance section.   Briscoe Deutscher, DO [place]

## 2019-10-18 NOTE — Patient Instructions (Signed)
I am adding [place]. I think that your insurance will cover it. Decrease your insulin by half. My hope is that you can wean off the insulin completely. You have to move more and stop drinking lemonade!

## 2019-10-20 ENCOUNTER — Other Ambulatory Visit (HOSPITAL_COMMUNITY): Payer: Self-pay

## 2019-10-20 DIAGNOSIS — F410 Panic disorder [episodic paroxysmal anxiety] without agoraphobia: Secondary | ICD-10-CM

## 2019-10-20 MED ORDER — CLONAZEPAM 1 MG PO TABS: 0.5000 mg | ORAL_TABLET | Freq: Four times a day (QID) | ORAL | 0 refills | Status: DC | PRN

## 2019-11-02 ENCOUNTER — Other Ambulatory Visit (INDEPENDENT_AMBULATORY_CARE_PROVIDER_SITE_OTHER): Payer: Self-pay | Admitting: Family Medicine

## 2019-11-02 DIAGNOSIS — G8929 Other chronic pain: Secondary | ICD-10-CM

## 2019-11-02 DIAGNOSIS — M5442 Lumbago with sciatica, left side: Secondary | ICD-10-CM

## 2019-11-02 NOTE — Telephone Encounter (Signed)
See request °

## 2019-11-02 NOTE — Telephone Encounter (Signed)
Copied from [place] [phone]. Topic: Quick Communication - Rx Refill/Question >> Nov 02, 2019  4:23 PM Leward Quan A wrote: Medication: lidocaine (LIDODERM) 5 %   Has the patient contacted their pharmacy? Yes.   (Agent: If no, request that the patient contact the pharmacy for the refill.) (Agent: If yes, when and what did the pharmacy advise?)  Preferred Pharmacy (with phone number or street name): [id number] [place], [place] [phone] (Phone) [phone] (Fax)    Agent: Please be advised that RX refills may take up to 3 business days. We ask that you follow-up with your pharmacy.

## 2019-11-03 MED ORDER — LIDOCAINE 5 % EX PTCH: 1.0000 | MEDICATED_PATCH | CUTANEOUS | 0 refills | Status: DC

## 2019-11-03 NOTE — Telephone Encounter (Signed)
See below a

## 2019-11-13 ENCOUNTER — Encounter (HOSPITAL_COMMUNITY): Payer: Self-pay | Admitting: Psychiatry

## 2019-11-13 ENCOUNTER — Ambulatory Visit (INDEPENDENT_AMBULATORY_CARE_PROVIDER_SITE_OTHER): Payer: Medicare Other | Admitting: Psychiatry

## 2019-11-13 ENCOUNTER — Other Ambulatory Visit: Payer: Self-pay

## 2019-11-13 DIAGNOSIS — F3131 Bipolar disorder, current episode depressed, mild: Secondary | ICD-10-CM

## 2019-11-13 DIAGNOSIS — F410 Panic disorder [episodic paroxysmal anxiety] without agoraphobia: Secondary | ICD-10-CM

## 2019-11-13 DIAGNOSIS — F4310 Post-traumatic stress disorder, unspecified: Secondary | ICD-10-CM | POA: Diagnosis not present

## 2019-11-13 MED ORDER — LAMOTRIGINE 100 MG PO TABS: 250.0000 mg | ORAL_TABLET | Freq: Every day | ORAL | 1 refills | Status: DC

## 2019-11-13 MED ORDER — CHLORPROMAZINE HCL 50 MG PO TABS: ORAL_TABLET | ORAL | 1 refills | Status: DC

## 2019-11-13 MED ORDER — CLONAZEPAM 1 MG PO TABS: 0.5000 mg | ORAL_TABLET | Freq: Four times a day (QID) | ORAL | 1 refills | Status: DC | PRN
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
## 2019-12-17 ENCOUNTER — Encounter: Payer: Self-pay | Admitting: Family Medicine

## 2019-12-19 ENCOUNTER — Other Ambulatory Visit: Payer: Self-pay

## 2019-12-19 DIAGNOSIS — G8929 Other chronic pain: Secondary | ICD-10-CM

## 2019-12-19 DIAGNOSIS — M5442 Lumbago with sciatica, left side: Secondary | ICD-10-CM

## 2019-12-19 MED ORDER — LIDOCAINE 5 % EX PTCH: 1.0000 | MEDICATED_PATCH | CUTANEOUS | 0 refills | Status: DC

## 2019-12-25 ENCOUNTER — Encounter: Payer: Self-pay | Admitting: Family Medicine

## 2019-12-25 ENCOUNTER — Telehealth: Payer: Self-pay | Admitting: Family Medicine

## 2019-12-25 NOTE — Telephone Encounter (Unsigned)
Copied from [place] [id number]. Topic: General - Other >> Dec 25, 2019  9:23 AM Celene Kras wrote: Reason for CRM: Pt called and is needing a PA for her lidocaine patches. Please advise.

## 2019-12-25 NOTE — Telephone Encounter (Signed)
See note

## 2019-12-26 NOTE — Telephone Encounter (Signed)
PA completed yesterday

## 2020-01-01 ENCOUNTER — Other Ambulatory Visit (HOSPITAL_COMMUNITY): Payer: Self-pay | Admitting: *Deleted

## 2020-01-01 DIAGNOSIS — F3131 Bipolar disorder, current episode depressed, mild: Secondary | ICD-10-CM

## 2020-01-01 MED ORDER — CHLORPROMAZINE HCL 50 MG PO TABS: ORAL_TABLET | ORAL | 0 refills | Status: DC

## 2020-01-04 ENCOUNTER — Encounter: Payer: Self-pay | Admitting: Family Medicine

## 2020-01-04 DIAGNOSIS — G8929 Other chronic pain: Secondary | ICD-10-CM

## 2020-01-05 ENCOUNTER — Other Ambulatory Visit: Payer: Self-pay

## 2020-01-05 MED ORDER — HYDROCODONE-ACETAMINOPHEN 10-325 MG PO TABS: 1.0000 | ORAL_TABLET | Freq: Three times a day (TID) | ORAL | 0 refills | Status: DC | PRN

## 2020-01-05 NOTE — Telephone Encounter (Signed)
Reviewed chart and PMP database. Refilled 30 days of narcotics. Will discuss future care at upcoming [facility] appt.

## 2020-01-09 ENCOUNTER — Ambulatory Visit (INDEPENDENT_AMBULATORY_CARE_PROVIDER_SITE_OTHER): Payer: Medicare Other | Admitting: Psychiatry

## 2020-01-09 ENCOUNTER — Encounter (HOSPITAL_COMMUNITY): Payer: Self-pay | Admitting: Psychiatry

## 2020-01-09 ENCOUNTER — Other Ambulatory Visit: Payer: Self-pay

## 2020-01-09 DIAGNOSIS — F4310 Post-traumatic stress disorder, unspecified: Secondary | ICD-10-CM | POA: Diagnosis not present

## 2020-01-09 DIAGNOSIS — F410 Panic disorder [episodic paroxysmal anxiety] without agoraphobia: Secondary | ICD-10-CM | POA: Diagnosis not present

## 2020-01-09 DIAGNOSIS — F3131 Bipolar disorder, current episode depressed, mild: Secondary | ICD-10-CM

## 2020-01-09 MED ORDER — CLONAZEPAM 1 MG PO TABS: 0.5000 mg | ORAL_TABLET | Freq: Four times a day (QID) | ORAL | 1 refills | Status: DC | PRN

## 2020-01-09 MED ORDER — LAMOTRIGINE 150 MG PO TABS: 300.0000 mg | ORAL_TABLET | Freq: Every day | ORAL | 1 refills | Status: DC

## 2020-01-09 MED ORDER — CHLORPROMAZINE HCL 50 MG PO TABS: 50.0000 mg | ORAL_TABLET | Freq: Every day | ORAL | 1 refills | Status: DC
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
## 2020-01-11 ENCOUNTER — Telehealth: Payer: Self-pay

## 2020-01-11 NOTE — Telephone Encounter (Signed)
  LAST APPOINTMENT DATE: @'@LASTENCT'$ @  NEXT APPOINTMENT DATE:'@1'$ /25/2021  MEDICATION:blood glucose meter kit and supplies [id number] [place] [facility] [place]  [phone] Fax:  [phone]    Patient want new meter because is out date. Patient states that she is out of supplies. Pt states she usually checks it 3 or 4 times a day. Pt is out of meds.  **Let patient know to contact pharmacy at the end of the day to make sure medication is ready. **  ** Please notify patient to allow 48-72 hours to process**  **Encourage patient to contact the pharmacy for refills or they can request refills through [facility]  CLINICAL FILLS OUT ALL BELOW:   LAST REFILL:  QTY:  REFILL DATE:    OTHER COMMENTS:    Okay for refill?  Please advise

## 2020-01-12 ENCOUNTER — Other Ambulatory Visit: Payer: Self-pay

## 2020-01-12 DIAGNOSIS — E118 Type 2 diabetes mellitus with unspecified complications: Secondary | ICD-10-CM

## 2020-01-12 DIAGNOSIS — Z794 Long term (current) use of insulin: Secondary | ICD-10-CM

## 2020-01-12 MED ORDER — BLOOD GLUCOSE MONITOR KIT: PACK | 0 refills | Status: AC

## 2020-01-12 MED ORDER — BLOOD GLUCOSE MONITOR KIT: PACK | 0 refills | Status: DC

## 2020-01-12 NOTE — Telephone Encounter (Signed)
Glucose meter kit and supplies faxed to [facility]

## 2020-01-22 ENCOUNTER — Ambulatory Visit (INDEPENDENT_AMBULATORY_CARE_PROVIDER_SITE_OTHER): Payer: Medicare Other | Admitting: Family Medicine

## 2020-01-22 ENCOUNTER — Other Ambulatory Visit: Payer: Self-pay

## 2020-01-22 ENCOUNTER — Encounter: Payer: Self-pay | Admitting: Family Medicine

## 2020-01-22 ENCOUNTER — Ambulatory Visit (HOSPITAL_COMMUNITY): Payer: Medicare Other | Admitting: Licensed Clinical Social Worker

## 2020-01-22 ENCOUNTER — Telehealth (INDEPENDENT_AMBULATORY_CARE_PROVIDER_SITE_OTHER): Payer: Medicare Other | Admitting: Licensed Clinical Social Worker

## 2020-01-22 VITALS — BP 124/78 | HR 84 | Temp 98.0°F | Ht 64.0 in | Wt 217.0 lb

## 2020-01-22 DIAGNOSIS — M5442 Lumbago with sciatica, left side: Secondary | ICD-10-CM | POA: Diagnosis not present

## 2020-01-22 DIAGNOSIS — Z794 Long term (current) use of insulin: Secondary | ICD-10-CM | POA: Diagnosis not present

## 2020-01-22 DIAGNOSIS — E1169 Type 2 diabetes mellitus with other specified complication: Secondary | ICD-10-CM | POA: Diagnosis not present

## 2020-01-22 DIAGNOSIS — Z79899 Other long term (current) drug therapy: Secondary | ICD-10-CM | POA: Insufficient documentation

## 2020-01-22 DIAGNOSIS — E118 Type 2 diabetes mellitus with unspecified complications: Secondary | ICD-10-CM

## 2020-01-22 DIAGNOSIS — E785 Hyperlipidemia, unspecified: Secondary | ICD-10-CM

## 2020-01-22 DIAGNOSIS — F1120 Opioid dependence, uncomplicated: Secondary | ICD-10-CM

## 2020-01-22 DIAGNOSIS — F4310 Post-traumatic stress disorder, unspecified: Secondary | ICD-10-CM

## 2020-01-22 DIAGNOSIS — G8929 Other chronic pain: Secondary | ICD-10-CM

## 2020-01-22 DIAGNOSIS — F319 Bipolar disorder, unspecified: Secondary | ICD-10-CM

## 2020-01-22 LAB — POCT GLYCOSYLATED HEMOGLOBIN (HGB A1C): Hemoglobin A1C: 6.2 % — AB (ref 4.0–5.6)

## 2020-01-22 MED ORDER — HYDROCODONE-ACETAMINOPHEN 10-325 MG PO TABS: 1.0000 | ORAL_TABLET | Freq: Three times a day (TID) | ORAL | 0 refills | Status: DC | PRN

## 2020-01-22 MED ORDER — GABAPENTIN 600 MG PO TABS: 900.0000 mg | ORAL_TABLET | Freq: Three times a day (TID) | ORAL | 3 refills | Status: AC

## 2020-01-22 NOTE — Patient Instructions (Signed)
Please return in 3 months for diabetes follow up  Please restart the [place] daily and stop the levemir. We will see if you tolerate it well. Check your sugars every morning: goal is < 130 And you can check 2 hours after lunch or dinner: goal is < 160. Please record these values for my review and bring in your meter to your next visit.   I have refilled your pain medications; we will try to get you into pain management.   It was a pleasure meeting you today! Thank you for choosing [facility] to meet your healthcare needs! I truly look forward to working with you. If you have any questions or concerns, please send me a message via Mychart or call the office at [phone].

## 2020-01-22 NOTE — Telephone Encounter (Signed)
Jodi Wolfe had a virtual assessment scheduled for 9am, but did not present for appointment on time.  Clinician contacted her by phone at 9:02am, but whomever picked up this call did not respond, and promptly hung up.  Clinician called again one minute later, but this call went to voicemail, so clinician left a voicemail reminding her of virtual appointment today, and included phone numbers for office phone, and front desk.  Clinician closed Webex meeting after 15 minutes of inactivity.    [address] [place], [place] 01/22/20
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
## 2020-01-30 ENCOUNTER — Other Ambulatory Visit: Payer: Self-pay | Admitting: Family Medicine

## 2020-01-30 DIAGNOSIS — G8929 Other chronic pain: Secondary | ICD-10-CM

## 2020-01-30 NOTE — Telephone Encounter (Signed)
LAST APPOINTMENT DATE: 01/22/2020 NEXT APPOINTMENT DATE:@4 /26/2021   LAST REFILL: 12/19/2019  MEDICATION: Lidocaine 5% patch QTY: 30 patches

## 2020-02-03 ENCOUNTER — Other Ambulatory Visit (HOSPITAL_COMMUNITY): Payer: Self-pay | Admitting: Psychiatry

## 2020-02-03 DIAGNOSIS — F3131 Bipolar disorder, current episode depressed, mild: Secondary | ICD-10-CM

## 2020-02-03 DIAGNOSIS — F4310 Post-traumatic stress disorder, unspecified: Secondary | ICD-10-CM

## 2020-02-05 ENCOUNTER — Ambulatory Visit (INDEPENDENT_AMBULATORY_CARE_PROVIDER_SITE_OTHER): Payer: Medicare Other | Admitting: Licensed Clinical Social Worker

## 2020-02-05 ENCOUNTER — Other Ambulatory Visit: Payer: Self-pay

## 2020-02-05 DIAGNOSIS — F314 Bipolar disorder, current episode depressed, severe, without psychotic features: Secondary | ICD-10-CM | POA: Diagnosis not present

## 2020-02-05 DIAGNOSIS — F4310 Post-traumatic stress disorder, unspecified: Secondary | ICD-10-CM
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
## 2020-02-12 ENCOUNTER — Ambulatory Visit (HOSPITAL_COMMUNITY): Payer: Medicare Other | Admitting: Psychiatry

## 2020-02-14 ENCOUNTER — Other Ambulatory Visit: Payer: Self-pay | Admitting: Family Medicine

## 2020-02-14 DIAGNOSIS — Z794 Long term (current) use of insulin: Secondary | ICD-10-CM

## 2020-02-14 DIAGNOSIS — E118 Type 2 diabetes mellitus with unspecified complications: Secondary | ICD-10-CM

## 2020-02-14 NOTE — Telephone Encounter (Signed)
Please review

## 2020-02-21 ENCOUNTER — Other Ambulatory Visit: Payer: Self-pay | Admitting: Family Medicine

## 2020-02-21 DIAGNOSIS — Z794 Long term (current) use of insulin: Secondary | ICD-10-CM

## 2020-02-21 DIAGNOSIS — E118 Type 2 diabetes mellitus with unspecified complications: Secondary | ICD-10-CM

## 2020-02-28 ENCOUNTER — Telehealth: Payer: Self-pay

## 2020-02-28 DIAGNOSIS — F3131 Bipolar disorder, current episode depressed, mild: Secondary | ICD-10-CM

## 2020-02-28 MED ORDER — CHLORPROMAZINE HCL 50 MG PO TABS: 50.0000 mg | ORAL_TABLET | Freq: Every day | ORAL | 0 refills | Status: DC

## 2020-02-28 NOTE — Telephone Encounter (Signed)
Medication refill - [facility]'s [facility] requesting a refill of her Chlorpromazine, last ordered 01/09/20 + 1 refill and pt does not return until 03/18/20.  Reqesting a 90 day order.

## 2020-02-28 NOTE — Telephone Encounter (Signed)
Done

## 2020-03-09 ENCOUNTER — Other Ambulatory Visit (HOSPITAL_COMMUNITY): Payer: Self-pay | Admitting: Psychiatry

## 2020-03-09 DIAGNOSIS — F3131 Bipolar disorder, current episode depressed, mild: Secondary | ICD-10-CM

## 2020-03-09 DIAGNOSIS — F4310 Post-traumatic stress disorder, unspecified: Secondary | ICD-10-CM

## 2020-03-18 ENCOUNTER — Other Ambulatory Visit: Payer: Self-pay

## 2020-03-18 ENCOUNTER — Ambulatory Visit (INDEPENDENT_AMBULATORY_CARE_PROVIDER_SITE_OTHER): Payer: Medicare Other | Admitting: Psychiatry

## 2020-03-18 ENCOUNTER — Encounter (HOSPITAL_COMMUNITY): Payer: Self-pay | Admitting: Psychiatry

## 2020-03-18 DIAGNOSIS — F3131 Bipolar disorder, current episode depressed, mild: Secondary | ICD-10-CM | POA: Diagnosis not present

## 2020-03-18 DIAGNOSIS — F4310 Post-traumatic stress disorder, unspecified: Secondary | ICD-10-CM | POA: Diagnosis not present

## 2020-03-18 DIAGNOSIS — F410 Panic disorder [episodic paroxysmal anxiety] without agoraphobia: Secondary | ICD-10-CM

## 2020-03-18 MED ORDER — CLONAZEPAM 1 MG PO TABS: 0.5000 mg | ORAL_TABLET | Freq: Four times a day (QID) | ORAL | 2 refills | Status: DC | PRN

## 2020-03-18 MED ORDER — LAMOTRIGINE 150 MG PO TABS: 300.0000 mg | ORAL_TABLET | Freq: Every day | ORAL | 0 refills | Status: DC

## 2020-03-18 MED ORDER — CHLORPROMAZINE HCL 50 MG PO TABS: 50.0000 mg | ORAL_TABLET | Freq: Every day | ORAL | 0 refills | Status: DC
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
## 2020-03-26 ENCOUNTER — Other Ambulatory Visit: Payer: Self-pay | Admitting: Family Medicine

## 2020-03-26 DIAGNOSIS — R1013 Epigastric pain: Secondary | ICD-10-CM

## 2020-04-01 ENCOUNTER — Other Ambulatory Visit: Payer: Self-pay | Admitting: Family Medicine

## 2020-04-15 DIAGNOSIS — M5442 Lumbago with sciatica, left side: Secondary | ICD-10-CM | POA: Diagnosis not present

## 2020-04-15 DIAGNOSIS — Z79899 Other long term (current) drug therapy: Secondary | ICD-10-CM | POA: Diagnosis not present

## 2020-04-15 DIAGNOSIS — M549 Dorsalgia, unspecified: Secondary | ICD-10-CM | POA: Diagnosis not present

## 2020-04-15 DIAGNOSIS — M129 Arthropathy, unspecified: Secondary | ICD-10-CM | POA: Diagnosis not present

## 2020-04-15 DIAGNOSIS — G8929 Other chronic pain: Secondary | ICD-10-CM | POA: Diagnosis not present

## 2020-04-15 DIAGNOSIS — Z1159 Encounter for screening for other viral diseases: Secondary | ICD-10-CM | POA: Diagnosis not present

## 2020-04-22 ENCOUNTER — Ambulatory Visit: Payer: Medicare Other | Admitting: Family Medicine

## 2020-04-29 ENCOUNTER — Ambulatory Visit: Payer: Medicare Other | Admitting: Family Medicine

## 2020-04-29 ENCOUNTER — Other Ambulatory Visit: Payer: Self-pay | Admitting: Family Medicine

## 2020-04-29 DIAGNOSIS — G8929 Other chronic pain: Secondary | ICD-10-CM | POA: Diagnosis not present

## 2020-04-29 DIAGNOSIS — G629 Polyneuropathy, unspecified: Secondary | ICD-10-CM | POA: Diagnosis not present

## 2020-04-29 DIAGNOSIS — Z79899 Other long term (current) drug therapy: Secondary | ICD-10-CM | POA: Diagnosis not present

## 2020-04-29 DIAGNOSIS — M5442 Lumbago with sciatica, left side: Secondary | ICD-10-CM | POA: Diagnosis not present

## 2020-04-29 DIAGNOSIS — M4310 Spondylolisthesis, site unspecified: Secondary | ICD-10-CM | POA: Diagnosis not present

## 2020-05-06 ENCOUNTER — Ambulatory Visit: Payer: Medicare Other | Admitting: Family Medicine

## 2020-05-06 ENCOUNTER — Other Ambulatory Visit: Payer: Self-pay | Admitting: Family Medicine

## 2020-05-06 DIAGNOSIS — M5442 Lumbago with sciatica, left side: Secondary | ICD-10-CM | POA: Diagnosis not present

## 2020-05-06 DIAGNOSIS — G629 Polyneuropathy, unspecified: Secondary | ICD-10-CM | POA: Diagnosis not present

## 2020-05-06 DIAGNOSIS — G8929 Other chronic pain: Secondary | ICD-10-CM | POA: Diagnosis not present

## 2020-05-30 ENCOUNTER — Encounter: Payer: Self-pay | Admitting: Family Medicine

## 2020-05-31 ENCOUNTER — Other Ambulatory Visit (HOSPITAL_COMMUNITY): Payer: Self-pay | Admitting: Psychiatry

## 2020-05-31 ENCOUNTER — Telehealth: Payer: Self-pay | Admitting: Family Medicine

## 2020-05-31 DIAGNOSIS — F4310 Post-traumatic stress disorder, unspecified: Secondary | ICD-10-CM

## 2020-05-31 DIAGNOSIS — F3131 Bipolar disorder, current episode depressed, mild: Secondary | ICD-10-CM

## 2020-05-31 NOTE — Telephone Encounter (Signed)
Patient called in this morning to reschedule her appointment that was for Monday, patient states she does not drive and can only come on Mondays, next available is not until July -could we schedule patient for 06/10/20 at 3 PM.

## 2020-05-31 NOTE — Telephone Encounter (Signed)
No, do not use a same day; it is a diabetes f/u and not urgent.  Can push out further in July or USE a virtual spot - I see several.  thanks

## 2020-06-01 ENCOUNTER — Other Ambulatory Visit (HOSPITAL_COMMUNITY): Payer: Self-pay | Admitting: Psychiatry

## 2020-06-01 DIAGNOSIS — F3131 Bipolar disorder, current episode depressed, mild: Secondary | ICD-10-CM

## 2020-06-03 ENCOUNTER — Ambulatory Visit: Payer: Medicare Other | Admitting: Family Medicine

## 2020-06-03 DIAGNOSIS — G8929 Other chronic pain: Secondary | ICD-10-CM | POA: Diagnosis not present

## 2020-06-03 DIAGNOSIS — Z79899 Other long term (current) drug therapy: Secondary | ICD-10-CM | POA: Diagnosis not present

## 2020-06-03 DIAGNOSIS — G629 Polyneuropathy, unspecified: Secondary | ICD-10-CM | POA: Diagnosis not present

## 2020-06-03 DIAGNOSIS — M5442 Lumbago with sciatica, left side: Secondary | ICD-10-CM | POA: Diagnosis not present

## 2020-06-06 ENCOUNTER — Other Ambulatory Visit: Payer: Self-pay | Admitting: Psychiatry

## 2020-06-06 DIAGNOSIS — F3131 Bipolar disorder, current episode depressed, mild: Secondary | ICD-10-CM

## 2020-06-07 ENCOUNTER — Other Ambulatory Visit: Payer: Self-pay | Admitting: Family Medicine

## 2020-06-07 DIAGNOSIS — R1013 Epigastric pain: Secondary | ICD-10-CM

## 2020-06-09 ENCOUNTER — Other Ambulatory Visit: Payer: Self-pay | Admitting: Family Medicine

## 2020-06-09 DIAGNOSIS — E118 Type 2 diabetes mellitus with unspecified complications: Secondary | ICD-10-CM

## 2020-06-09 DIAGNOSIS — Z794 Long term (current) use of insulin: Secondary | ICD-10-CM

## 2020-06-10 ENCOUNTER — Encounter: Payer: Self-pay | Admitting: Family Medicine

## 2020-06-10 ENCOUNTER — Other Ambulatory Visit: Payer: Self-pay

## 2020-06-10 DIAGNOSIS — R1013 Epigastric pain: Secondary | ICD-10-CM

## 2020-06-10 MED ORDER — DEXLANSOPRAZOLE 60 MG PO CPDR: 60.0000 mg | DELAYED_RELEASE_CAPSULE | Freq: Every day | ORAL | 6 refills | Status: DC

## 2020-06-11 ENCOUNTER — Other Ambulatory Visit (HOSPITAL_COMMUNITY): Payer: Self-pay | Admitting: *Deleted

## 2020-06-11 DIAGNOSIS — F410 Panic disorder [episodic paroxysmal anxiety] without agoraphobia: Secondary | ICD-10-CM

## 2020-06-11 MED ORDER — CLONAZEPAM 1 MG PO TABS: 0.5000 mg | ORAL_TABLET | Freq: Four times a day (QID) | ORAL | 0 refills | Status: DC | PRN

## 2020-06-17 ENCOUNTER — Ambulatory Visit: Payer: Medicare Other | Admitting: Family Medicine

## 2020-06-17 ENCOUNTER — Encounter: Payer: Self-pay | Admitting: Family Medicine

## 2020-06-17 ENCOUNTER — Telehealth (INDEPENDENT_AMBULATORY_CARE_PROVIDER_SITE_OTHER): Payer: Medicare Other | Admitting: Psychiatry

## 2020-06-17 ENCOUNTER — Telehealth (INDEPENDENT_AMBULATORY_CARE_PROVIDER_SITE_OTHER): Payer: Medicare Other | Admitting: Family Medicine

## 2020-06-17 ENCOUNTER — Encounter (HOSPITAL_COMMUNITY): Payer: Self-pay | Admitting: Psychiatry

## 2020-06-17 ENCOUNTER — Other Ambulatory Visit: Payer: Self-pay

## 2020-06-17 VITALS — Ht 64.0 in | Wt 211.0 lb

## 2020-06-17 DIAGNOSIS — F1120 Opioid dependence, uncomplicated: Secondary | ICD-10-CM | POA: Diagnosis not present

## 2020-06-17 DIAGNOSIS — F410 Panic disorder [episodic paroxysmal anxiety] without agoraphobia: Secondary | ICD-10-CM | POA: Diagnosis not present

## 2020-06-17 DIAGNOSIS — Z6841 Body Mass Index (BMI) 40.0 and over, adult: Secondary | ICD-10-CM

## 2020-06-17 DIAGNOSIS — F3131 Bipolar disorder, current episode depressed, mild: Secondary | ICD-10-CM | POA: Diagnosis not present

## 2020-06-17 DIAGNOSIS — F4310 Post-traumatic stress disorder, unspecified: Secondary | ICD-10-CM | POA: Diagnosis not present

## 2020-06-17 DIAGNOSIS — Z794 Long term (current) use of insulin: Secondary | ICD-10-CM

## 2020-06-17 DIAGNOSIS — G8929 Other chronic pain: Secondary | ICD-10-CM

## 2020-06-17 DIAGNOSIS — E118 Type 2 diabetes mellitus with unspecified complications: Secondary | ICD-10-CM | POA: Diagnosis not present

## 2020-06-17 DIAGNOSIS — E1169 Type 2 diabetes mellitus with other specified complication: Secondary | ICD-10-CM

## 2020-06-17 DIAGNOSIS — E785 Hyperlipidemia, unspecified: Secondary | ICD-10-CM

## 2020-06-17 DIAGNOSIS — Z79899 Other long term (current) drug therapy: Secondary | ICD-10-CM

## 2020-06-17 DIAGNOSIS — M5442 Lumbago with sciatica, left side: Secondary | ICD-10-CM

## 2020-06-17 DIAGNOSIS — Z1159 Encounter for screening for other viral diseases: Secondary | ICD-10-CM

## 2020-06-17 MED ORDER — CHLORPROMAZINE HCL 50 MG PO TABS: 50.0000 mg | ORAL_TABLET | Freq: Every day | ORAL | 0 refills | Status: DC

## 2020-06-17 MED ORDER — LAMOTRIGINE 150 MG PO TABS: 300.0000 mg | ORAL_TABLET | Freq: Every day | ORAL | 0 refills | Status: DC

## 2020-06-17 MED ORDER — OZEMPIC (1 MG/DOSE) 2 MG/1.5ML ~~LOC~~ SOPN: 1.0000 mg | PEN_INJECTOR | SUBCUTANEOUS | 3 refills | Status: DC

## 2020-06-17 MED ORDER — CLONAZEPAM 1 MG PO TABS: 0.5000 mg | ORAL_TABLET | Freq: Four times a day (QID) | ORAL | 2 refills | Status: DC | PRN
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
## 2020-06-17 NOTE — Progress Notes (Signed)
Virtual Visit via Video Note  Subjective  CC:  Chief Complaint  Patient presents with  . Diabetes    home checks have been under 200 both fasting and after eating.      I connected with Jodi Wolfe on 06/17/20 at 11:30 AM EDT by a video enabled telemedicine application and verified that I am speaking with the correct person using two identifiers. Location patient: Home Location provider: [facility] participating in the virtual visit: Jodi Wolfe, Leamon Arnt, MD  Lonell Grandchild, CMA  Pt is virtual because she has problems with transportation to office.   I discussed the limitations of evaluation and management by telemedicine and the availability of in person appointments. The patient expressed understanding and agreed to proceed. HPI: Jodi Wolfe is a 49 y.o. female who was contacted today to address the problems listed above in the chief complaint, f/u diabetes:  Diabetes follow up: Her diabetic control is reported as Improved. Last vist we stopped levemir and changed to [place] in addition to ozempic. She is tolerating both well. fastings are improved but remain above goal with an avg of 160 in the am. From chart review, she has never been on metformin. Diet is fair although at times she still drinks sweetened beverages.  She denies exertional CP or SOB or symptomatic hypoglycemia. She denies foot sores or paresthesias.  covid vaccine questions answered. She is tentative about getting vaccinated.   HLD on statin with LDL above goal.   She reports her mood is fair. She is seeing psych regularly.   Chronic pain now managed at [facility].   HM: will be due for cpe in November.   Immunization History  Administered Date(s) Administered  . Influenza,inj,Quad PF,6+ Mos 11/29/2017, 11/09/2018  . Pneumococcal Polysaccharide-23 04/19/2018  . Tdap 11/27/2016    Diabetes Related Lab Review: Lab Results  Component  Value Date   HGBA1C 6.2 (A) 01/22/2020   HGBA1C 6.9 (H) 10/18/2019   HGBA1C 7.5 (H) 05/29/2019    Lab Results  Component Value Date   MICROALBUR 2.2 (H) 05/29/2019   Lab Results  Component Value Date   CREATININE 0.74 10/18/2019   BUN 15 10/18/2019   NA 138 10/18/2019   K 5.0 10/18/2019   CL 97 10/18/2019   CO2 32 10/18/2019   Lab Results  Component Value Date   CHOL 175 05/29/2019   CHOL 163 02/13/2019   CHOL 235 (H) 04/19/2017   Lab Results  Component Value Date   HDL 50.60 05/29/2019   HDL 60.30 02/13/2019   HDL 59.50 04/19/2017   Lab Results  Component Value Date   LDLCALC 94 05/29/2019   LDLCALC 74 02/13/2019   LDLCALC 149 (H) 04/19/2017   Lab Results  Component Value Date   TRIG 155.0 (H) 05/29/2019   TRIG 143.0 02/13/2019   TRIG 131.0 04/19/2017   Lab Results  Component Value Date   CHOLHDL 3 05/29/2019   CHOLHDL 3 02/13/2019   CHOLHDL 4 04/19/2017   No results found for: LDLDIRECT The 10-year ASCVD risk score Mikey Bussing DC Jr., et al., 2013) is: 1.9%   Values used to calculate the score:     Age: 24 years     Sex: Female     Is Non-Hispanic African American: No     Diabetic: Yes     Tobacco smoker: No     Systolic Blood Pressure: 948 mmHg     Is  BP treated: No     HDL Cholesterol: 50.6 mg/dL     Total Cholesterol: 175 mg/dL  BP Readings from Last 3 Encounters:  01/22/20 124/78  10/18/19 110/74  06/05/19 115/76   Wt Readings from Last 3 Encounters:  06/17/20 211 lb (95.7 kg)  01/22/20 217 lb (98.4 kg)  10/18/19 216 lb (98 kg)    Health Maintenance  Topic Date Due  . Hepatitis C Screening  Never done  . COVID-19 Vaccine (1) Never done  . URINE MICROALBUMIN  05/28/2020  . OPHTHALMOLOGY EXAM  06/17/2020 (Originally 07/05/2018)  . HEMOGLOBIN A1C  07/21/2020  . INFLUENZA VACCINE  07/28/2020  . FOOT EXAM  10/17/2020  . PAP SMEAR-Modifier  05/17/2022  . TETANUS/TDAP  11/27/2026  . PNEUMOCOCCAL POLYSACCHARIDE VACCINE AGE 24-64 HIGH RISK   Completed  . HIV Screening  Completed    Assessment  1. Type 2 diabetes mellitus with complication, with long-term current use of insulin ([place])   2. Hyperlipidemia associated with type 2 diabetes mellitus (H[facility])   3. Class 3 severe obesity due to excess calories with serious comorbidity and body mass index (BMI) of 40.0 to 44.9 in adult ([place])   4. Chronic narcotic dependence ([place])   5. Chronic midline low back pain with left-sided sciatica   6. High risk medication use      Plan   Diabetes is currently adequately controlled. Need to get a1c; pt to schedule lab visit. Future orders placed. Will add metformin or levemir if a1c is elevated. Can't get to eye exam due to limited transportation. No h/o retinopathy  Vaccine counseling given.  HLD on statin. Will recheck in October and push statin dose up if not yet at goal. Continue diet education.   Narcotic dependence for chronic pain per pain mgt.  Diabetic education: ongoing education regarding chronic disease management for diabetes was given today. We continue to reinforce the ABC's of diabetic management: A1c (<7 or 8 dependent upon patient), tight blood pressure control, and cholesterol management with goal LDL < 100 minimally. We discuss diet strategies, exercise recommendations, medication options and possible side effects. At each visit, we review recommended immunizations and preventive care recommendations for diabetics and stress that good diabetic control can prevent other problems. See below for this patient's data.  I discussed the assessment and treatment plan with the patient. The patient was provided an opportunity to ask questions and all were answered. The patient agreed with the plan and demonstrated an understanding of the instructions.   The patient was advised to call back or seek an in-person evaluation if the symptoms worsen or if the condition fails to improve as anticipated. Follow up: cpe in October.  Visit date  not found  No orders of the defined types were placed in this encounter.     I reviewed the patients updated PMH, FH, and SocHx.    Patient Active Problem List   Diagnosis Date Noted  . Chronic narcotic dependence ([place]) 01/22/2020    Priority: High  . High risk medication use 01/22/2020    Priority: High  . Hyperlipidemia associated with type 2 diabetes mellitus ([place]) 02/16/2019    Priority: High  . Lumbar radiculopathy 11/21/2018    Priority: High  . Chronic back pain - pain management [place] 11/10/2018    Priority: High  . PTSD (post-traumatic stress disorder) 11/10/2018    Priority: High  . Bipolar depression ([place]), followed by Psychiatry 11/10/2018    Priority: High  . Class 3 severe  obesity due to excess calories with serious comorbidity and body mass index (BMI) of 40.0 to 44.9 in adult [facility]) 04/19/2017    Priority: High  . GAD (generalized anxiety disorder) 04/19/2017    Priority: High  . Type 2 diabetes mellitus with complication, with long-term current use of insulin ([place]) 04/19/2017    Priority: High  . Perimenopausal vasomotor symptoms 10/18/2019    Priority: Medium  . Dyspepsia 02/16/2019    Priority: Medium  . Psychophysiological insomnia 04/19/2017    Priority: Medium   Current Meds  Medication Sig  . atorvastatin (LIPITOR) 10 MG tablet TAKE 1 TABLET BY MOUTH EVERY DAY  . blood glucose meter kit and supplies KIT Dispense based on patient and insurance preference. Use up to four times daily as directed. (FOR ICD-9 250.00, 250.01).  . chlorproMAZINE (THORAZINE) 50 MG tablet Take 1 tablet (50 mg total) by mouth at bedtime.  . clonazePAM (KLONOPIN) 1 MG tablet Take 0.5 tablets (0.5 mg total) by mouth 4 (four) times daily as needed for anxiety.  Marland Kitchen dexlansoprazole (DEXILANT) 60 MG capsule Take 1 capsule (60 mg total) by mouth daily.  Marland Kitchen FARXIGA 10 MG TABS tablet TAKE 1 TABLET BY MOUTH EVERY DAY BEFORE BREAKFAST  . gabapentin (NEURONTIN) 600 MG tablet Take 1.5  tablets (900 mg total) by mouth 3 (three) times daily.  Marland Kitchen HYDROcodone-acetaminophen (NORCO) 10-325 MG tablet Take 1 tablet by mouth every 8 (eight) hours as needed for severe pain (Chronic pain).  Marland Kitchen lamoTRIgine (LAMICTAL) 150 MG tablet Take 2 tablets (300 mg total) by mouth daily.  Marland Kitchen lidocaine (LIDODERM) 5 % PLACE 1 PATCH ONTO THE SKIN DAILY. REMOVE & DISCARD PATCH WITHIN 12 HOURS OR AS DIRECTED BY MD  . naproxen (NAPROSYN) 500 MG tablet Take 0.5 tablets (250 mg total) by mouth 2 (two) times daily with a meal.  . ondansetron (ZOFRAN-ODT) 4 MG disintegrating tablet TAKE 1 TABLET BY MOUTH EVERY 8 HOURS AS NEEDED FOR NAUSEA AND VOMITING  . ONETOUCH ULTRA test strip USE UP TO 4 TIMES A DAY AS DIRECTED  . Semaglutide, 1 MG/DOSE, (OZEMPIC, 1 MG/DOSE,) 2 MG/1.5ML SOPN Inject 1 pen into the skin once a week.    Allergies: Patient is allergic to morphine and related and sulfa antibiotics. Family History: Patient family history includes Anxiety disorder in her brother; Bipolar disorder in her father; Depression in her mother; Drug abuse in her mother; Hyperlipidemia in her brother, father, and mother; Hypertension in her brother, father, and mother; Stroke in her father and mother. Social History:  Patient  reports that she has never smoked. She has never used smokeless tobacco. She reports that she does not drink alcohol and does not use drugs.  Review of Systems: Constitutional: Negative for fever malaise or anorexia Cardiovascular: negative for chest pain Respiratory: negative for SOB or persistent cough Gastrointestinal: negative for abdominal pain  OBJECTIVE Vitals: Ht '5\' 4"'$  (1.626 m)   Wt 211 lb (95.7 kg)   BMI 36.22 kg/m  General: no acute distress , A&Ox3  Leamon Arnt, MD

## 2020-06-28 ENCOUNTER — Ambulatory Visit: Payer: Medicare Other

## 2020-07-09 ENCOUNTER — Telehealth (HOSPITAL_COMMUNITY): Payer: Self-pay | Admitting: Licensed Clinical Social Worker

## 2020-07-09 ENCOUNTER — Other Ambulatory Visit: Payer: Self-pay

## 2020-07-09 ENCOUNTER — Ambulatory Visit (HOSPITAL_COMMUNITY): Payer: Medicare Other | Admitting: Licensed Clinical Social Worker

## 2020-07-09 NOTE — Telephone Encounter (Signed)
Jodi Wolfe had a virtual therapy session scheduled at 11am today.  Jodi Wolfe did not present for session on time, so clinician sent email reminding her of this appointment at 11:03am.  Clinician then attempted contact by phone at 11:06am, but call went directly to voicemail.  Clinician left message reminding Jodi Wolfe of this appointment, and included contact numbers for personal office if she was having technical issues, as well as front desk staff if she needed to reschedule.  Clinician ended virtual session when Jodi Wolfe did not present after 15 minutes, or call back.  Clinician informed front desk of no show event.    Jodi Stain, LCSW, LCAS 07/09/20

## 2020-07-15 ENCOUNTER — Ambulatory Visit (INDEPENDENT_AMBULATORY_CARE_PROVIDER_SITE_OTHER): Payer: Medicare Other

## 2020-07-15 DIAGNOSIS — Z0000 Encounter for general adult medical examination without abnormal findings: Secondary | ICD-10-CM

## 2020-07-15 NOTE — Progress Notes (Signed)
Virtual Visit via Telephone Note  I connected with  Jodi Wolfe on 07/15/20 at 10:15 AM EDT by telephone and verified that I am speaking with the correct person using two identifiers.  Medicare Annual Wellness visit completed telephonically due to Covid-19 pandemic.   Persons participating in this call: This Health Coach and this patient.   Location: Patient: Home Provider: Office   I discussed the limitations, risks, security and privacy concerns of performing an evaluation and management service by telephone and the availability of in person appointments. The patient expressed understanding and agreed to proceed.  Unable to perform video visit due to video visit attempted and failed and/or patient does not have video capability.   Some vital signs may be absent or patient reported.   Willette Brace, LPN    Subjective:   Jodi Wolfe is a 49 y.o. female who presents for Medicare Annual (Subsequent) preventive examination.  Review of Systems     Cardiac Risk Factors include: diabetes mellitus;dyslipidemia;obesity (BMI >30kg/m2)     Objective:    Today's Vitals   07/15/20 1010  PainSc: 6    There is no height or weight on file to calculate BMI.  Advanced Directives 07/15/2020 05/12/2019  Does Patient Have a Medical Advance Directive? No No  Would patient like information on creating a medical advance directive? No - Patient declined No - Patient declined  Some encounter information is confidential and restricted. Go to Review Flowsheets activity to see all data.    Current Medications (verified) Outpatient Encounter Medications as of 07/15/2020  Medication Sig  . atorvastatin (LIPITOR) 10 MG tablet TAKE 1 TABLET BY MOUTH EVERY DAY  . blood glucose meter kit and supplies KIT Dispense based on patient and insurance preference. Use up to four times daily as directed. (FOR ICD-9 250.00, 250.01).  . chlorproMAZINE (THORAZINE) 50 MG tablet Take 1 tablet (50 mg total) by mouth  at bedtime.  . clonazePAM (KLONOPIN) 1 MG tablet Take 0.5 tablets (0.5 mg total) by mouth 4 (four) times daily as needed for anxiety.  Marland Kitchen dexlansoprazole (DEXILANT) 60 MG capsule Take 1 capsule (60 mg total) by mouth daily.  Marland Kitchen FARXIGA 10 MG TABS tablet TAKE 1 TABLET BY MOUTH EVERY DAY BEFORE BREAKFAST  . gabapentin (NEURONTIN) 600 MG tablet Take 1.5 tablets (900 mg total) by mouth 3 (three) times daily.  Marland Kitchen HYDROcodone-acetaminophen (NORCO) 10-325 MG tablet Take 1 tablet by mouth every 8 (eight) hours as needed for severe pain (Chronic pain).  Marland Kitchen lamoTRIgine (LAMICTAL) 150 MG tablet Take 2 tablets (300 mg total) by mouth daily.  Marland Kitchen lidocaine (LIDODERM) 5 % PLACE 1 PATCH ONTO THE SKIN DAILY. REMOVE & DISCARD PATCH WITHIN 12 HOURS OR AS DIRECTED BY MD  . Karma Greaser 4 MG/0.1ML LIQD nasal spray kit SMARTSIG:1 Spray(s) Both Nares Once PRN  . ondansetron (ZOFRAN-ODT) 4 MG disintegrating tablet TAKE 1 TABLET BY MOUTH EVERY 8 HOURS AS NEEDED FOR NAUSEA AND VOMITING  . ONETOUCH ULTRA test strip USE UP TO 4 TIMES A DAY AS DIRECTED  . Semaglutide, 1 MG/DOSE, (OZEMPIC, 1 MG/DOSE,) 2 MG/1.5ML SOPN Inject 0.75 mLs (1 mg total) into the skin once a week.  . naproxen (NAPROSYN) 500 MG tablet Take 0.5 tablets (250 mg total) by mouth 2 (two) times daily with a meal. (Patient not taking: Reported on 07/15/2020)  . [DISCONTINUED] esomeprazole (NEXIUM) 40 MG capsule TAKE 1 CAPSULE BY MOUTH EVERY DAY   No facility-administered encounter medications on file as of 07/15/2020.  Allergies (verified) Morphine and related and Sulfa antibiotics   History: Past Medical History:  Diagnosis Date  . Anxiety   . Depression   . Diabetes mellitus without complication ([place])   . GERD (gastroesophageal reflux disease)   . Headache   . Neuropathy    Past Surgical History:  Procedure Laterality Date  . APPENDECTOMY    . CHOLECYSTECTOMY    . TONSILLECTOMY AND ADENOIDECTOMY     Family History  Problem Relation Age of Onset  .  Hyperlipidemia Mother   . Hypertension Mother   . Stroke Mother   . Depression Mother   . Drug abuse Mother   . Hyperlipidemia Father   . Hypertension Father   . Stroke Father   . Bipolar disorder Father   . Hyperlipidemia Brother   . Hypertension Brother   . Anxiety disorder Brother    Social History   Socioeconomic History  . Marital status: Divorced    Spouse name: Not on file  . Number of children: Not on file  . Years of education: Not on file  . Highest education level: Not on file  Occupational History  . Occupation: not employed  Tobacco Use  . Smoking status: Never Smoker  . Smokeless tobacco: Never Used  Vaping Use  . Vaping Use: Never used  Substance and Sexual Activity  . Alcohol use: No  . Drug use: No  . Sexual activity: Not Currently    Birth control/protection: None  Other Topics Concern  . Not on file  Social History Narrative   She is divorced with 4 grown children (3 sons, 1 daughter).    Moved to [place] to take care of her granddaughter.    Social Determinants of Health   Financial Resource Strain: Low Risk   . Difficulty of Paying Living Expenses: Not hard at all  Food Insecurity: No Food Insecurity  . Worried About Charity fundraiser in the Last Year: Never true  . Ran Out of Food in the Last Year: Never true  Transportation Needs: No Transportation Needs  . Lack of Transportation (Medical): No  . Lack of Transportation (Non-Medical): No  Physical Activity: Insufficiently Active  . Days of Exercise per Week: 3 days  . Minutes of Exercise per Session: 20 min  Stress: No Stress Concern Present  . Feeling of Stress : Not at all  Social Connections: Moderately Isolated  . Frequency of Communication with Friends and Family: More than three times a week  . Frequency of Social Gatherings with Friends and Family: Twice a week  . Attends Religious Services: More than 4 times per year  . Active Member of Clubs or Organizations: No  . Attends  Archivist Meetings: Never  . Marital Status: Divorced    Tobacco Counseling Counseling given: Not Answered   Clinical Intake:  Pre-visit preparation completed: Yes  Pain : 0-10 Pain Score: 6  Pain Type: Chronic pain Pain Location: Back Pain Orientation: Posterior Pain Descriptors / Indicators: Burning, Stabbing Pain Onset: More than a month ago Pain Frequency: Constant Pain Relieving Factors: pain meds and injections (gives relief)  Pain Relieving Factors: pain meds and injections (gives relief)  BMI - recorded: 33.68 Nutritional Status: BMI > 30  Obese Diabetes: Yes CBG done?: No Did pt. bring in CBG monitor from home?: No Glucose Meter Downloaded?: No  How often do you need to have someone help you when you read instructions, pamphlets, or other written materials from your doctor or pharmacy?: 1 -  Never  Diabetic?Yes  Interpreter Needed?: No  Information entered by :: Charlott Rakes,  LPN   Activities of Daily Living In your present state of health, do you have any difficulty performing the following activities: 07/15/2020  Hearing? N  Vision? N  Difficulty concentrating or making decisions? N  Walking or climbing stairs? N  Dressing or bathing? N  Doing errands, shopping? N  Preparing Food and eating ? N  Using the Toilet? N  In the past six months, have you accidently leaked urine? N  Do you have problems with loss of bowel control? N  Managing your Medications? N  Managing your Finances? N  Housekeeping or managing your Housekeeping? N  Some recent data might be hidden    Patient Care Team: Leamon Arnt, MD as PCP - General (Family Medicine) Adele Schilder, Arlyce Harman, MD as Consulting Physician (Psychiatry) Magnus Sinning, MD as Consulting Physician (Physical Medicine and Rehabilitation) Irwin Brakeman Higinio Roger, FNP (Pain Medicine)  Indicate any recent Medical Services you may have received from other than [facility] providers in the past year (date  may be approximate).     Assessment:   This is a routine wellness examination for Jodi Wolfe.  Hearing/Vision screen  Hearing Screening   '125Hz'$  '250Hz'$  '500Hz'$  '1000Hz'$  '2000Hz'$  '3000Hz'$  '4000Hz'$  '6000Hz'$  '8000Hz'$   Right ear:           Left ear:           Comments: Pt denies any hearing difficulty  Vision Screening Comments: Follows up with annual eye exams at [facility] on [address] and pt wears eyeglasses  Dietary issues and exercise activities discussed: Current Exercise Habits: The patient does not participate in regular exercise at present  Goals    . Patient Stated     Continue to lose weight      Depression Screen PHQ 2/9 Scores 07/15/2020 10/18/2019 05/12/2019 02/13/2019 04/19/2017  PHQ - 2 Score 0 0 0 0 0  PHQ- 9 Score - '3 4 1 '$ -    Fall Risk Fall Risk  07/15/2020 05/12/2019 02/13/2019  Falls in the past year? 0 0 0  Number falls in past yr: 0 - 0  Injury with Fall? 0 - 0  Risk for fall due to : Impaired vision;Impaired mobility - -  Risk for fall due to: Comment related to back pain - -  Follow up Falls prevention discussed - -    Any stairs in or around the home? Yes  If so, are there any without handrails? No  Home free of loose throw rugs in walkways, pet beds, electrical cords, etc? Yes  Adequate lighting in your home to reduce risk of falls? Yes   ASSISTIVE DEVICES UTILIZED TO PREVENT FALLS:  Life alert? No  Use of a cane, walker or w/c? No  Grab bars in the bathroom? No  Shower chair or bench in shower? No  Elevated toilet seat or a handicapped toilet? No   TIMED UP AND GO:  Was the test performed? No .    Cognitive Function:     6CIT Screen 07/15/2020  What Year? 0 points  What month? 0 points  What time? 0 points  Count back from 20 0 points  Months in reverse 0 points  Repeat phrase 0 points  Total Score 0    Immunizations Immunization History  Administered Date(s) Administered  . Influenza,inj,Quad PF,6+ Mos 11/29/2017, 11/09/2018  . Pneumococcal  Polysaccharide-23 04/19/2018  . Tdap 11/27/2016    TDAP status: Up to date Flu  Vaccine status: Up to date Pneumococcal vaccine status: Up to date Covid-19 vaccine status: Declined, Education has been provided regarding the importance of this vaccine but patient still declined. Advised may receive this vaccine at local pharmacy or Health Dept.or vaccine clinic. Aware to provide a copy of the vaccination record if obtained from local pharmacy or Health Dept. Verbalized acceptance and understanding.  Qualifies for Shingles Vaccine? No   Zostavax completed No   Shingrix Completed?: No.    Education has been provided regarding the importance of this vaccine. Patient has been advised to call insurance company to determine out of pocket expense if they have not yet received this vaccine. Advised may also receive vaccine at local pharmacy or Health Dept. Verbalized acceptance and understanding.  Screening Tests Health Maintenance  Topic Date Due  . Hepatitis C Screening  Never done  . COVID-19 Vaccine (1) Never done  . OPHTHALMOLOGY EXAM  07/05/2018  . URINE MICROALBUMIN  05/28/2020  . HEMOGLOBIN A1C  07/21/2020  . INFLUENZA VACCINE  07/28/2020  . FOOT EXAM  10/17/2020  . PAP SMEAR-Modifier  05/17/2022  . TETANUS/TDAP  11/27/2026  . PNEUMOCOCCAL POLYSACCHARIDE VACCINE AGE 67-64 HIGH RISK  Completed  . HIV Screening  Completed    Health Maintenance  Health Maintenance Due  Topic Date Due  . Hepatitis C Screening  Never done  . COVID-19 Vaccine (1) Never done  . OPHTHALMOLOGY EXAM  07/05/2018  . URINE MICROALBUMIN  05/28/2020    Colorectal cancer screening: Referral to GI placed 07/15/20. Pt aware the office will call re: appt. Mammogram status: Ordered 07/15/20. Pt provided with contact info and advised to call to schedule appt.     Additional Screening:  Hepatitis C Screening: does qualify  Vision Screening: Recommended annual ophthalmology exams for early detection of glaucoma  and other disorders of the eye. Is the patient up to date with their annual eye exam?  No  Who is the provider or what is the name of the office in which the patient attends annual eye exams? [facility] on [address] in [place]: Recommended annual dental exams for proper oral hygiene  Community Resource Referral / Chronic Care Management: CRR required this visit?  No   CCM required this visit?  No      Plan:     I have personally reviewed and noted the following in the patient's chart:   . Medical and social history . Use of alcohol, tobacco or illicit drugs  . Current medications and supplements . Functional ability and status . Nutritional status . Physical activity . Advanced directives . List of other physicians . Hospitalizations, surgeries, and ER visits in previous 12 months . Vitals . Screenings to include cognitive, depression, and falls . Referrals and appointments  In addition, I have reviewed and discussed with patient certain preventive protocols, quality metrics, and best practice recommendations. A written personalized care plan for preventive services as well as general preventive health recommendations were provided to patient.     Willette Brace, LPN   [id number]   Nurse Notes: None

## 2020-07-15 NOTE — Patient Instructions (Addendum)
Jodi Wolfe , Thank you for taking time to come for your Medicare Wellness Visit. I appreciate your ongoing commitment to your health goals. Please review the following plan we discussed and let me know if I can assist you in the future.   Screening recommendations/referrals: Colonoscopy: Not of age Mammogram: Due Bone Density: No of age Recommended yearly ophthalmology/optometry visit for glaucoma screening and checkup Recommended yearly dental visit for hygiene and checkup  Vaccinations: Influenza vaccine: Up to date Pneumococcal vaccine: Up to date Tdap vaccine: Up to date Shingles vaccine: Not of age  Covid-6: Declined  Advanced directives: Not at this time  Conditions/risks identified: Continue to lose weight  Next appointment: Follow up in one year for your annual wellness visit.   Preventive Care 40-64 Years, Female Preventive care refers to lifestyle choices and visits with your health care provider that can promote health and wellness. What does preventive care include?  A yearly physical exam. This is also called an annual well check.  Dental exams once or twice a year.  Routine eye exams. Ask your health care provider how often you should have your eyes checked.  Personal lifestyle choices, including:  Daily care of your teeth and gums.  Regular physical activity.  Eating a healthy diet.  Avoiding tobacco and drug use.  Limiting alcohol use.  Practicing safe sex.  Taking low-dose aspirin daily starting at age 24.  Taking vitamin and mineral supplements as recommended by your health care provider. What happens during an annual well check? The services and screenings done by your health care provider during your annual well check will depend on your age, overall health, lifestyle risk factors, and family history of disease. Counseling  Your health care provider may ask you questions about your:  Alcohol use.  Tobacco use.  Drug use.  Emotional  well-being.  Home and relationship well-being.  Sexual activity.  Eating habits.  Work and work Statistician.  Method of birth control.  Menstrual cycle.  Pregnancy history. Screening  You may have the following tests or measurements:  Height, weight, and BMI.  Blood pressure.  Lipid and cholesterol levels. These may be checked every 5 years, or more frequently if you are over 2 years old.  Skin check.  Lung cancer screening. You may have this screening every year starting at age 50 if you have a 30-pack-year history of smoking and currently smoke or have quit within the past 15 years.  Fecal occult blood test (FOBT) of the stool. You may have this test every year starting at age 75.  Flexible sigmoidoscopy or colonoscopy. You may have a sigmoidoscopy every 5 years or a colonoscopy every 10 years starting at age 18.  Hepatitis C blood test.  Hepatitis B blood test.  Sexually transmitted disease (STD) testing.  Diabetes screening. This is done by checking your blood sugar (glucose) after you have not eaten for a while (fasting). You may have this done every 1-3 years.  Mammogram. This may be done every 1-2 years. Talk to your health care provider about when you should start having regular mammograms. This may depend on whether you have a family history of breast cancer.  BRCA-related cancer screening. This may be done if you have a family history of breast, ovarian, tubal, or peritoneal cancers.  Pelvic exam and Pap test. This may be done every 3 years starting at age 5. Starting at age 76, this may be done every 5 years if you have a Pap test in  combination with an HPV test.  Bone density scan. This is done to screen for osteoporosis. You may have this scan if you are at high risk for osteoporosis. Discuss your test results, treatment options, and if necessary, the need for more tests with your health care provider. Vaccines  Your health care provider may recommend  certain vaccines, such as:  Influenza vaccine. This is recommended every year.  Tetanus, diphtheria, and acellular pertussis (Tdap, Td) vaccine. You may need a Td booster every 10 years.  Zoster vaccine. You may need this after age 50.  Pneumococcal 13-valent conjugate (PCV13) vaccine. You may need this if you have certain conditions and were not previously vaccinated.  Pneumococcal polysaccharide (PPSV23) vaccine. You may need one or two doses if you smoke cigarettes or if you have certain conditions. Talk to your health care provider about which screenings and vaccines you need and how often you need them. This information is not intended to replace advice given to you by your health care provider. Make sure you discuss any questions you have with your health care provider. Document Released: 01/10/2016 Document Revised: 09/02/2016 Document Reviewed: 10/15/2015 Elsevier Interactive Patient Education  2017 [employer] Prevention in the Home Falls can cause injuries. They can happen to people of all ages. There are many things you can do to make your home safe and to help prevent falls. What can I do on the outside of my home?  Regularly fix the edges of walkways and driveways and fix any cracks.  Remove anything that might make you trip as you walk through a door, such as a raised step or threshold.  Trim any bushes or trees on the path to your home.  Use bright outdoor lighting.  Clear any walking paths of anything that might make someone trip, such as rocks or tools.  Regularly check to see if handrails are loose or broken. Make sure that both sides of any steps have handrails.  Any raised decks and porches should have guardrails on the edges.  Have any leaves, snow, or ice cleared regularly.  Use sand or salt on walking paths during winter.  Clean up any spills in your garage right away. This includes oil or grease spills. What can I do in the bathroom?  Use  night lights.  Install grab bars by the toilet and in the tub and shower. Do not use towel bars as grab bars.  Use non-skid mats or decals in the tub or shower.  If you need to sit down in the shower, use a plastic, non-slip stool.  Keep the floor dry. Clean up any water that spills on the floor as soon as it happens.  Remove soap buildup in the tub or shower regularly.  Attach bath mats securely with double-sided non-slip rug tape.  Do not have throw rugs and other things on the floor that can make you trip. What can I do in the bedroom?  Use night lights.  Make sure that you have a light by your bed that is easy to reach.  Do not use any sheets or blankets that are too big for your bed. They should not hang down onto the floor.  Have a firm chair that has side arms. You can use this for support while you get dressed.  Do not have throw rugs and other things on the floor that can make you trip. What can I do in the kitchen?  Clean up any spills  right away.  Avoid walking on wet floors.  Keep items that you use a lot in easy-to-reach places.  If you need to reach something above you, use a strong step stool that has a grab bar.  Keep electrical cords out of the way.  Do not use floor polish or wax that makes floors slippery. If you must use wax, use non-skid floor wax.  Do not have throw rugs and other things on the floor that can make you trip. What can I do with my stairs?  Do not leave any items on the stairs.  Make sure that there are handrails on both sides of the stairs and use them. Fix handrails that are broken or loose. Make sure that handrails are as long as the stairways.  Check any carpeting to make sure that it is firmly attached to the stairs. Fix any carpet that is loose or worn.  Avoid having throw rugs at the top or bottom of the stairs. If you do have throw rugs, attach them to the floor with carpet tape.  Make sure that you have a light switch at  the top of the stairs and the bottom of the stairs. If you do not have them, ask someone to add them for you. What else can I do to help prevent falls?  Wear shoes that:  Do not have high heels.  Have rubber bottoms.  Are comfortable and fit you well.  Are closed at the toe. Do not wear sandals.  If you use a stepladder:  Make sure that it is fully opened. Do not climb a closed stepladder.  Make sure that both sides of the stepladder are locked into place.  Ask someone to hold it for you, if possible.  Clearly mark and make sure that you can see:  Any grab bars or handrails.  First and last steps.  Where the edge of each step is.  Use tools that help you move around (mobility aids) if they are needed. These include:  Canes.  Walkers.  Scooters.  Crutches.  Turn on the lights when you go into a dark area. Replace any light bulbs as soon as they burn out.  Set up your furniture so you have a clear path. Avoid moving your furniture around.  If any of your floors are uneven, fix them.  If there are any pets around you, be aware of where they are.  Review your medicines with your doctor. Some medicines can make you feel dizzy. This can increase your chance of falling. Ask your doctor what other things that you can do to help prevent falls. This information is not intended to replace advice given to you by your health care provider. Make sure you discuss any questions you have with your health care provider. Document Released: 10/10/2009 Document Revised: 05/21/2016 Document Reviewed: 01/18/2015 Elsevier Interactive Patient Education  2017 [employer].

## 2020-07-22 DIAGNOSIS — M810 Age-related osteoporosis without current pathological fracture: Secondary | ICD-10-CM | POA: Diagnosis not present

## 2020-07-22 DIAGNOSIS — M539 Dorsopathy, unspecified: Secondary | ICD-10-CM | POA: Diagnosis not present

## 2020-07-29 ENCOUNTER — Other Ambulatory Visit: Payer: Medicare Other

## 2020-07-30 DIAGNOSIS — M5442 Lumbago with sciatica, left side: Secondary | ICD-10-CM | POA: Diagnosis not present

## 2020-07-30 DIAGNOSIS — G8929 Other chronic pain: Secondary | ICD-10-CM | POA: Diagnosis not present

## 2020-07-30 DIAGNOSIS — M539 Dorsopathy, unspecified: Secondary | ICD-10-CM | POA: Diagnosis not present

## 2020-07-30 DIAGNOSIS — M4180 Other forms of scoliosis, site unspecified: Secondary | ICD-10-CM | POA: Diagnosis not present

## 2020-07-30 DIAGNOSIS — Z79899 Other long term (current) drug therapy: Secondary | ICD-10-CM | POA: Diagnosis not present

## 2020-08-12 ENCOUNTER — Other Ambulatory Visit: Payer: Self-pay

## 2020-08-12 ENCOUNTER — Other Ambulatory Visit: Payer: Medicare Other

## 2020-08-12 DIAGNOSIS — Z794 Long term (current) use of insulin: Secondary | ICD-10-CM

## 2020-08-12 DIAGNOSIS — E1169 Type 2 diabetes mellitus with other specified complication: Secondary | ICD-10-CM

## 2020-08-12 DIAGNOSIS — E785 Hyperlipidemia, unspecified: Secondary | ICD-10-CM

## 2020-08-12 DIAGNOSIS — E118 Type 2 diabetes mellitus with unspecified complications: Secondary | ICD-10-CM | POA: Diagnosis not present

## 2020-08-12 NOTE — Addendum Note (Signed)
Addended by: Mathews Argyle on: 08/12/2020 09:03 AM   Modules accepted: Orders

## 2020-08-13 LAB — COMPREHENSIVE METABOLIC PANEL
AG Ratio: 1.6 (calc) (ref 1.0–2.5)
ALT: 10 U/L (ref 6–29)
AST: 10 U/L (ref 10–35)
Albumin: 4.5 g/dL (ref 3.6–5.1)
Alkaline phosphatase (APISO): 100 U/L (ref 31–125)
BUN: 8 mg/dL (ref 7–25)
CO2: 31 mmol/L (ref 20–32)
Calcium: 9.4 mg/dL (ref 8.6–10.2)
Chloride: 102 mmol/L (ref 98–110)
Creat: 0.73 mg/dL (ref 0.50–1.10)
Globulin: 2.8 g/dL (calc) (ref 1.9–3.7)
Glucose, Bld: 176 mg/dL — ABNORMAL HIGH (ref 65–99)
Potassium: 4.5 mmol/L (ref 3.5–5.3)
Sodium: 139 mmol/L (ref 135–146)
Total Bilirubin: 0.5 mg/dL (ref 0.2–1.2)
Total Protein: 7.3 g/dL (ref 6.1–8.1)

## 2020-08-13 LAB — LIPID PANEL
Cholesterol: 146 mg/dL (ref <200)
HDL: 51 mg/dL (ref >=50)
LDL Cholesterol (Calc): 74 mg/dL (calc)
Non-HDL Cholesterol (Calc): 95 mg/dL (calc) (ref <130)
Total CHOL/HDL Ratio: 2.9 (calc) (ref <5.0)
Triglycerides: 119 mg/dL (ref <150)

## 2020-08-13 LAB — HEMOGLOBIN A1C
Hgb A1c MFr Bld: 6.1 % of total Hgb — ABNORMAL HIGH (ref <5.7)
Mean Plasma Glucose: 128 (calc)
eAG (mmol/L): 7.1 (calc)

## 2020-08-16 ENCOUNTER — Other Ambulatory Visit: Payer: Self-pay | Admitting: Family Medicine

## 2020-08-16 DIAGNOSIS — E118 Type 2 diabetes mellitus with unspecified complications: Secondary | ICD-10-CM

## 2020-08-16 DIAGNOSIS — M5442 Lumbago with sciatica, left side: Secondary | ICD-10-CM

## 2020-08-16 DIAGNOSIS — G8929 Other chronic pain: Secondary | ICD-10-CM

## 2020-08-16 DIAGNOSIS — Z794 Long term (current) use of insulin: Secondary | ICD-10-CM

## 2020-08-17 ENCOUNTER — Encounter: Payer: Self-pay | Admitting: Family Medicine

## 2020-08-18 ENCOUNTER — Other Ambulatory Visit (HOSPITAL_COMMUNITY): Payer: Self-pay | Admitting: Psychiatry

## 2020-08-18 DIAGNOSIS — F4310 Post-traumatic stress disorder, unspecified: Secondary | ICD-10-CM

## 2020-08-18 DIAGNOSIS — F3131 Bipolar disorder, current episode depressed, mild: Secondary | ICD-10-CM

## 2020-08-18 NOTE — Telephone Encounter (Signed)
Yes please

## 2020-08-19 ENCOUNTER — Other Ambulatory Visit: Payer: Self-pay | Admitting: Family Medicine

## 2020-08-19 DIAGNOSIS — E118 Type 2 diabetes mellitus with unspecified complications: Secondary | ICD-10-CM

## 2020-08-19 DIAGNOSIS — Z794 Long term (current) use of insulin: Secondary | ICD-10-CM

## 2020-08-26 ENCOUNTER — Other Ambulatory Visit: Payer: Self-pay | Admitting: Family Medicine

## 2020-08-26 DIAGNOSIS — E118 Type 2 diabetes mellitus with unspecified complications: Secondary | ICD-10-CM

## 2020-08-26 DIAGNOSIS — Z794 Long term (current) use of insulin: Secondary | ICD-10-CM

## 2020-08-30 DIAGNOSIS — M47819 Spondylosis without myelopathy or radiculopathy, site unspecified: Secondary | ICD-10-CM | POA: Diagnosis not present

## 2020-09-03 DIAGNOSIS — G8929 Other chronic pain: Secondary | ICD-10-CM | POA: Diagnosis not present

## 2020-09-03 DIAGNOSIS — M5442 Lumbago with sciatica, left side: Secondary | ICD-10-CM | POA: Diagnosis not present

## 2020-09-03 DIAGNOSIS — Z79899 Other long term (current) drug therapy: Secondary | ICD-10-CM | POA: Diagnosis not present

## 2020-09-03 DIAGNOSIS — M539 Dorsopathy, unspecified: Secondary | ICD-10-CM | POA: Diagnosis not present

## 2020-09-03 DIAGNOSIS — M47819 Spondylosis without myelopathy or radiculopathy, site unspecified: Secondary | ICD-10-CM | POA: Diagnosis not present

## 2020-09-09 ENCOUNTER — Other Ambulatory Visit: Payer: Self-pay

## 2020-09-09 ENCOUNTER — Telehealth (HOSPITAL_COMMUNITY): Payer: Self-pay | Admitting: Licensed Clinical Social Worker

## 2020-09-09 ENCOUNTER — Ambulatory Visit (HOSPITAL_COMMUNITY): Payer: Medicaid Other | Admitting: Licensed Clinical Social Worker

## 2020-09-09 NOTE — Telephone Encounter (Signed)
Clinician contacted Jodi Wolfe by phone today when she did not present for virtual therapy session on time.  Jodi Wolfe answered phone call and spoke in a manner that was alert, oriented x5, with no evidence or self-report of SI/HI or A/V H.  Jodi Wolfe reported that she would be unable to engage in therapy today due to something that had come up with her family this morning.  Clinician encouraged her to reschedule when possible, and confirmed that she had the contact number for outpatient office to assist.  Clinician will continue to monitor.    [name], [place], LCAS 09/09/20

## 2020-09-12 ENCOUNTER — Other Ambulatory Visit (HOSPITAL_COMMUNITY): Payer: Self-pay | Admitting: Psychiatry

## 2020-09-12 DIAGNOSIS — F410 Panic disorder [episodic paroxysmal anxiety] without agoraphobia: Secondary | ICD-10-CM

## 2020-09-16 ENCOUNTER — Other Ambulatory Visit: Payer: Self-pay

## 2020-09-16 ENCOUNTER — Encounter (HOSPITAL_COMMUNITY): Payer: Self-pay | Admitting: Psychiatry

## 2020-09-16 ENCOUNTER — Telehealth (INDEPENDENT_AMBULATORY_CARE_PROVIDER_SITE_OTHER): Payer: Medicare Other | Admitting: Psychiatry

## 2020-09-16 DIAGNOSIS — F4310 Post-traumatic stress disorder, unspecified: Secondary | ICD-10-CM

## 2020-09-16 DIAGNOSIS — F3131 Bipolar disorder, current episode depressed, mild: Secondary | ICD-10-CM | POA: Diagnosis not present

## 2020-09-16 DIAGNOSIS — F410 Panic disorder [episodic paroxysmal anxiety] without agoraphobia: Secondary | ICD-10-CM | POA: Diagnosis not present

## 2020-09-16 MED ORDER — CHLORPROMAZINE HCL 50 MG PO TABS: 50.0000 mg | ORAL_TABLET | Freq: Every day | ORAL | 0 refills | Status: DC

## 2020-09-16 MED ORDER — CLONAZEPAM 0.5 MG PO TABS: 0.5000 mg | ORAL_TABLET | Freq: Four times a day (QID) | ORAL | 2 refills | Status: DC | PRN

## 2020-09-16 MED ORDER — LAMOTRIGINE 150 MG PO TABS: 300.0000 mg | ORAL_TABLET | Freq: Every day | ORAL | 0 refills | Status: DC
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
## 2020-09-23 ENCOUNTER — Other Ambulatory Visit: Payer: Self-pay

## 2020-09-23 ENCOUNTER — Ambulatory Visit (INDEPENDENT_AMBULATORY_CARE_PROVIDER_SITE_OTHER): Payer: Medicare Other | Admitting: Licensed Clinical Social Worker

## 2020-09-23 DIAGNOSIS — F4310 Post-traumatic stress disorder, unspecified: Secondary | ICD-10-CM | POA: Diagnosis not present

## 2020-09-23 DIAGNOSIS — F314 Bipolar disorder, current episode depressed, severe, without psychotic features: Secondary | ICD-10-CM | POA: Diagnosis not present

## 2020-09-23 NOTE — Progress Notes (Signed)
Virtual Visit via Telephone Note   I connected with Jodi Wolfe on 09/23/20 at 11:00am by telephone and verified that I am speaking with the correct person using two identifiers.   I discussed the limitations, risks, security and privacy concerns of performing an evaluation and management service by telephone and the availability of in person appointments. I also discussed with the patient that there may be a patient responsible charge related to this service. The patient expressed understanding and agreed to proceed.   I discussed the assessment and treatment plan with the patient. The patient was provided an opportunity to ask questions and all were answered. The patient agreed with the plan and demonstrated an understanding of the instructions.   The patient was advised to call back or seek an in-person evaluation if the symptoms worsen or if the condition fails to improve as anticipated.   I provided 30 minutes of non-face-to-face time during this encounter.     [name], LCSW, LCAS ________________________________ THERAPIST PROGRESS NOTE   Session Time: 11:00am - 11:30am  Location: Patient: Patient Home Provider: [facility]    Participation Level: Active   Behavioral Response: Alert, anxious mood   Type of Therapy:  Individual Therapy   Treatment Goals addressed: Depression and Anxiety management; Medication Compliance; Psychiatry Appointments; Family Support/Socialization    Interventions: CBT, treatment planning    Summary: Jodi Wolfe is a 49 year old Caucasian female that presented for telephone appointment today with diagnoses of Bipolar Disorder, current episode depressed, severe w/out psychotic features; and PTSD.         Suicidal/Homicidal: None; without plan or intent.    Therapist Response: Clinician spoke with Jodi Wolfe for therapy today by phone when she was unable to access virtual session despite text and email invites being sent.  Clinician assessed for  safety, sobriety, and medication compliance.  Jodi Wolfe spoke in a manner that was alert, oriented x5, with no evidence or self-report of SI/HI or A/V H.  Jodi Wolfe reported ongoing compliance with medication and denied use of alcohol or illicit substances.  She denied any manic episodes recently. Clinician inquired about Jodi Wolfe's current emotional ratings, as well as any significant changes in thoughts, feelings, or behavior since last conversation in February.  Jodi Wolfe reported scores of 3/10 for depression and 7/10 for anxiety, and noted that she is now having panic attacks roughly x2 per day on average, and grieving the loss of another family member, which she considered traumatizing, and has led to regular nightmares.  Clinician inquired about why Jodi Wolfe has waited so long to schedule an initial therapy appointment despite worsening presentation.  Jodi Wolfe reported that "There have been a lot of things going on" and she never got around to prioritizing her own self-care.  Clinician recommended revisiting treatment plan today to make updates as needed due to length of time that has passed, and Jodi Wolfe was agreeable to this.  Revised treatment goals agreed upon by Jodi Wolfe at this time are as follows: Meet with clinician virtually once every 2 weeks for therapy to address progress towards goals and barriers to success;  Meet with psychiatrist once every 3 months to address efficacy of medication and make adjustments as needed to regimen and/or dosage; Take medications daily as prescribed to reduce symptoms and improve overall daily functioning; Reduce depression from average severity level of 3/10 down to a 1/10 in the next 90 days by spending 3 days per week, 8 hours per day with grandchildren caring and playing with them; Reduce  anxiety from average severity level of 9/10 down to a 6/10 in next 90 days by utilizing relaxation techniques learned from therapy 2-3 times per day; Continue to process loss of both aunts by facetiming  with supportive family members x3 times per week, allowing time to cry when emotions swell up, and reading bible/praying for 30 minutes daily; Repair and strengthen relationships with children by making self available each day to assist them and be more present, show increased effort in outreaching them at least once per day, and show willingness to discuss and process their sense of abandonment;  Maintain spiritual and community support by attending church service on Sundays with friends;  Reduce panic attacks from x14 per week on average down to x7 within next 90 days by practicing grounding skills at least x2 per day, in addition to reflecting upon triggers afterward that could be contributing to recent increase;  Improve both mental and physical wellness by checking in with PCP once every 3 months and following diabetic healthy diet recommended daily; Acquire driver's license by Summer 2022 to aid in reducing reliance on children to get around, as well as open up new opportunities for hobbies/self-care activities to include in daily routine.  Clinician offered to begin discussion of grounding techniques today with Jodi Wolfe to help manage increase in panic attacks, but she reported that she would like to end session at halfway mark instead and schedule another appointment in 2 weeks.  Clinician will continue to monitor.       Plan: Follow up again in 2 weeks virtually.   Diagnosis: Bipolar Disorder, current episode depressed, severe w/out psychotic features; and PTSD.   [name], LCSW, LCAS 09/23/20

## 2020-09-29 DIAGNOSIS — M47819 Spondylosis without myelopathy or radiculopathy, site unspecified: Secondary | ICD-10-CM | POA: Diagnosis not present

## 2020-10-01 DIAGNOSIS — M5442 Lumbago with sciatica, left side: Secondary | ICD-10-CM | POA: Diagnosis not present

## 2020-10-01 DIAGNOSIS — G8929 Other chronic pain: Secondary | ICD-10-CM | POA: Diagnosis not present

## 2020-10-01 DIAGNOSIS — Z79899 Other long term (current) drug therapy: Secondary | ICD-10-CM | POA: Diagnosis not present

## 2020-10-01 DIAGNOSIS — M4180 Other forms of scoliosis, site unspecified: Secondary | ICD-10-CM | POA: Diagnosis not present

## 2020-10-01 DIAGNOSIS — M47819 Spondylosis without myelopathy or radiculopathy, site unspecified: Secondary | ICD-10-CM | POA: Diagnosis not present

## 2020-10-07 ENCOUNTER — Ambulatory Visit (INDEPENDENT_AMBULATORY_CARE_PROVIDER_SITE_OTHER): Payer: Medicare Other | Admitting: Licensed Clinical Social Worker

## 2020-10-07 ENCOUNTER — Other Ambulatory Visit: Payer: Self-pay

## 2020-10-07 DIAGNOSIS — F314 Bipolar disorder, current episode depressed, severe, without psychotic features: Secondary | ICD-10-CM

## 2020-10-07 DIAGNOSIS — F4310 Post-traumatic stress disorder, unspecified: Secondary | ICD-10-CM

## 2020-10-07 NOTE — Progress Notes (Signed)
Virtual Visit via Telephone Note   I connected with Jodi Wolfe on 10/07/20 at 10:00am by telephone and verified that I am speaking with the correct person using two identifiers.   I discussed the limitations, risks, security and privacy concerns of performing an evaluation and management service by telephone and the availability of in person appointments. I also discussed with the patient that there may be a patient responsible charge related to this service. The patient expressed understanding and agreed to proceed.   I discussed the assessment and treatment plan with the patient. The patient was provided an opportunity to ask questions and all were answered. The patient agreed with the plan and demonstrated an understanding of the instructions.   The patient was advised to call back or seek an in-person evaluation if the symptoms worsen or if the condition fails to improve as anticipated.   I provided 1 hour of non-face-to-face time during this encounter.     Jodi Stain, LCSW, LCAS ________________________________ THERAPIST PROGRESS NOTE   Session Time: 10:00am - 11:00am  Location: Patient: Patient Home Provider: [facility]    Participation Level: Active   Behavioral Response: Alert, anxious mood   Type of Therapy:  Individual Therapy   Treatment Goals addressed: Depression and Anxiety management; Medication Compliance; Family Support/Socialization    Interventions: CBT, mental grounding techniques   Summary: Jodi Wolfe is a 49 year old Caucasian female that presented for telephone appointment today with diagnoses of Bipolar Disorder, current episode depressed, severe w/out psychotic features; and PTSD.         Suicidal/Homicidal: None; without plan or intent.    Therapist Response: Clinician spoke with Jodi Wolfe for appointment today by phone, as she continues to struggle with accessing virtual sessions.  Clinician assessed for safety, sobriety, and medication compliance.   Jodi Wolfe spoke in a manner that was alert, oriented x5, with no evidence or self-report of SI/HI or A/V H.  Jodi Wolfe reported that she continues taking medication as prescribed and denied any use of alcohol or illicit substances.  Jodi Wolfe denied any reoccurrence of mania.  Clinician inquired about Jodi Wolfe's emotional ratings today, as well as any significant changes in thoughts, feelings, or behavior since previous. Jodi Wolfe reported scores of 1/10 for depression and 7/10 for anxiety, and noted that she experienced panic attacks 4-5x per day in past week.  Clinician inquired about Jodi Wolfe's recent successes and struggles in past weeks.  Jodi Wolfe reported that she has continued to engage in church for support and actively takes care of her grandchildren to help the family out, but the combined stress of recent sickness, family issues, and triggering news on TV has influenced her regular panic attacks and made her feel like she cannot relax or focus most days.  Clinician discussed mental grounding techniques with Jodi Wolfe today that could be utilized for temporary distraction when she begins to feel overwhelmed with negative emotions and nearing panic, including examples such as describing her environment in detail, imagining something pleasant/calming, reading something slowly, and counting to 10 while taking slow breaths.  Clinician also encouraged Jodi Wolfe to be mindful of ensuring outlets for stress through the week, such as positive self-care activities she used to enjoy but has fallen out of routine with.  Interventions were effective, as evidenced by Jodi Wolfe practicing all of these techniques in session today and noting that she would make an effort to try them out since they appear helpful.  Jodi Wolfe also reported that talking about stressors was helpful, as she does not  have many supports to process challenges with, stating "It always feels better to share a bit.  There's a whole lot I don't let out with my family that I need to".   Clinician will continue to monitor.       Plan: Follow up again in 2 weeks virtually.   Diagnosis: Bipolar Disorder, current episode depressed, severe w/out psychotic features; and PTSD.   Jodi Stain, LCSW, LCAS 10/07/20

## 2020-10-11 ENCOUNTER — Other Ambulatory Visit: Payer: Self-pay | Admitting: Family Medicine

## 2020-10-11 DIAGNOSIS — Z794 Long term (current) use of insulin: Secondary | ICD-10-CM

## 2020-10-12 ENCOUNTER — Encounter: Payer: Self-pay | Admitting: Family Medicine

## 2020-10-13 ENCOUNTER — Other Ambulatory Visit: Payer: Self-pay

## 2020-10-13 DIAGNOSIS — E118 Type 2 diabetes mellitus with unspecified complications: Secondary | ICD-10-CM

## 2020-10-13 DIAGNOSIS — Z794 Long term (current) use of insulin: Secondary | ICD-10-CM

## 2020-10-13 MED ORDER — DAPAGLIFLOZIN PROPANEDIOL 10 MG PO TABS: ORAL_TABLET | ORAL | 3 refills | Status: DC

## 2020-10-19 ENCOUNTER — Other Ambulatory Visit: Payer: Self-pay | Admitting: Family Medicine

## 2020-10-19 DIAGNOSIS — Z794 Long term (current) use of insulin: Secondary | ICD-10-CM

## 2020-10-19 DIAGNOSIS — E118 Type 2 diabetes mellitus with unspecified complications: Secondary | ICD-10-CM

## 2020-10-22 ENCOUNTER — Other Ambulatory Visit: Payer: Self-pay

## 2020-10-22 ENCOUNTER — Ambulatory Visit (INDEPENDENT_AMBULATORY_CARE_PROVIDER_SITE_OTHER): Payer: Medicare Other | Admitting: Licensed Clinical Social Worker

## 2020-10-22 DIAGNOSIS — F314 Bipolar disorder, current episode depressed, severe, without psychotic features: Secondary | ICD-10-CM | POA: Diagnosis not present

## 2020-10-22 DIAGNOSIS — F4310 Post-traumatic stress disorder, unspecified: Secondary | ICD-10-CM

## 2020-10-22 NOTE — Progress Notes (Signed)
Virtual Visit via Telephone Note   I connected with Jodi Wolfe on 10/22/20 at 10:00am by telephone and verified that I am speaking with the correct person using two identifiers.   I discussed the limitations, risks, security and privacy concerns of performing an evaluation and management service by telephone and the availability of in person appointments. I also discussed with the patient that there may be a patient responsible charge related to this service. The patient expressed understanding and agreed to proceed.   I discussed the assessment and treatment plan with the patient. The patient was provided an opportunity to ask questions and all were answered. The patient agreed with the plan and demonstrated an understanding of the instructions.   The patient was advised to call back or seek an in-person evaluation if the symptoms worsen or if the condition fails to improve as anticipated.   I provided 45 minutes of non-face-to-face time during this encounter.     Jodi Stain, LCSW, LCAS ________________________________ THERAPIST PROGRESS NOTE   Session Time: 10:00am - 10:45am  Location: Patient: Patient Home Provider: [facility]    Participation Level: Active   Behavioral Response: Alert, anxious mood   Type of Therapy:  Individual Therapy   Treatment Goals addressed: Depression and Anxiety management; Medication Compliance; Family Support/Socialization; Grief and loss   Interventions: CBT, grief and loss, physical grounding techniques   Summary: Jodi Wolfe is a 49 year old Caucasian female that presented for telephone appointment today with diagnoses of Bipolar Disorder, current episode depressed, severe w/out psychotic features; and PTSD.         Suicidal/Homicidal: None; without plan or intent.    Therapist Response: Clinician spoke with Jodi Wolfe for therapy session today by phone, as she remains unable to access virtual appointments.  Clinician assessed for safety,  sobriety, and medication compliance.  Jodi Wolfe spoke in a manner that was alert, oriented x5, with no evidence or self-report of SI/HI or A/V H.  Jodi Wolfe Wolfe ongoing compliance with medication and denied any use of alcohol or illicit substances.  Jodi Wolfe denied any manic episodes.  Clinician inquired about Jodi Wolfe's current emotional ratings, as well as any significant changes in thoughts, feelings, or behavior since last check-in. Jodi Wolfe scores of 2/10 for depression, 9/10 for anxiety, and noted that she has had panic attacks 3x per day in last week, with most recent occurring this morning at 9am shortly after waking up.  Clinician inquired about present stressors Jodi Wolfe is facing which may have influenced this event, and how she attempted to cope in order to de-escalate.  Jodi Wolfe Wolfe that she woke up, prayed, and read her bible as part of normal routine, but upon browsing Facebook, she came across a family's members post memorializing her aunt's passing one year ago, and shortly thereafter she began to have a panic episode.  Jodi Wolfe Wolfe that she did utilize some of the previously discussed mental grounding techniques, which appeared to help calm her.  Jodi Wolfe began to weep in session and opened up about regrets she has regarding this family members passing.  Clinician provided her with space to share and process these feelings, encouraging her to avoid bottling up these feelings, and link with positive supports during this time to reflect upon positive memories she shared with the deceased in order to facilitate healthy grieving process.  Clinician also encouraged Jodi Wolfe to be mindful of moderating social media boundaries and potential for seeing triggering material that may influence mood.  Jodi Wolfe was receptive to suggestions  and stated "I'm glad I got to see her one last time, tell her I love her, and know she is in a better place and no longer hurting".  Jodi Wolfe Wolfe that an additional stressor  influencing her mood at this time was learning recently that her father has developed Parkinson's, and she feels powerless to help due to distance, and his disinterest in moving out to stay with them as disease progresses.  Clinician empathized with Jodi Wolfe and explored solutions with her for providing assistance to loved one at this time without neglecting her own needs to ensure healthy balance.  Jodi Wolfe acknowledged that she could help set up home care and medical appointments from out of state, call often to check in, and continue to offer support within reason to avoid feeling like she is neglecting him.  Clinician also discussed physical grounding techniques with Jodi Wolfe that she could utilize to temporarily distract from distressing thoughts and/or feelings to help reduce panic attacks.  Jodi Wolfe tried some of these out in session and Wolfe that she has already been relying upon some, such as splashing water on her face, and snapping a rubber band on her wrist, but has considered getting a stress ball to relieve tension, so she would try out the others as well to test benefits.  Interventions were effective, as evidenced by Jodi Wolfe reporting that she found today's session helpful for calming her down following reminder of her aunt's passing and news of father's condition.  She Wolfe that she is also eager to learn more techniques to help with anxiety and panic episodes due to overall reduction seen in frequency since start of therapy.  Clinician will continue to monitor.       Plan: Follow up again in 2 weeks virtually.   Diagnosis: Bipolar Disorder, current episode depressed, severe w/out psychotic features; and PTSD.   Jodi Stain, LCSW, LCAS 10/22/20

## 2020-10-30 DIAGNOSIS — M47819 Spondylosis without myelopathy or radiculopathy, site unspecified: Secondary | ICD-10-CM | POA: Diagnosis not present

## 2020-11-01 ENCOUNTER — Other Ambulatory Visit: Payer: Self-pay | Admitting: Family Medicine

## 2020-11-01 ENCOUNTER — Other Ambulatory Visit (HOSPITAL_COMMUNITY): Payer: Self-pay | Admitting: Psychiatry

## 2020-11-01 DIAGNOSIS — R1013 Epigastric pain: Secondary | ICD-10-CM

## 2020-11-01 DIAGNOSIS — F4310 Post-traumatic stress disorder, unspecified: Secondary | ICD-10-CM

## 2020-11-01 DIAGNOSIS — F3131 Bipolar disorder, current episode depressed, mild: Secondary | ICD-10-CM

## 2020-11-04 DIAGNOSIS — Z79899 Other long term (current) drug therapy: Secondary | ICD-10-CM | POA: Diagnosis not present

## 2020-11-04 DIAGNOSIS — M47819 Spondylosis without myelopathy or radiculopathy, site unspecified: Secondary | ICD-10-CM | POA: Diagnosis not present

## 2020-11-04 DIAGNOSIS — M479 Spondylosis, unspecified: Secondary | ICD-10-CM | POA: Diagnosis not present

## 2020-11-04 DIAGNOSIS — M5442 Lumbago with sciatica, left side: Secondary | ICD-10-CM | POA: Diagnosis not present

## 2020-11-04 DIAGNOSIS — G8929 Other chronic pain: Secondary | ICD-10-CM | POA: Diagnosis not present

## 2020-11-05 ENCOUNTER — Other Ambulatory Visit: Payer: Self-pay

## 2020-11-05 ENCOUNTER — Ambulatory Visit (INDEPENDENT_AMBULATORY_CARE_PROVIDER_SITE_OTHER): Payer: Medicare Other | Admitting: Licensed Clinical Social Worker

## 2020-11-05 DIAGNOSIS — F314 Bipolar disorder, current episode depressed, severe, without psychotic features: Secondary | ICD-10-CM | POA: Diagnosis not present

## 2020-11-05 DIAGNOSIS — F4310 Post-traumatic stress disorder, unspecified: Secondary | ICD-10-CM

## 2020-11-05 NOTE — Progress Notes (Signed)
Virtual Visit via Telephone Note   I connected with [name] on 11/05/20 at 10:00am by telephone and verified that I am speaking with the correct person using two identifiers.   I discussed the limitations, risks, security and privacy concerns of performing an evaluation and management service by telephone and the availability of in person appointments. I also discussed with the patient that there may be a patient responsible charge related to this service. The patient expressed understanding and agreed to proceed.   I discussed the assessment and treatment plan with the patient. The patient was provided an opportunity to ask questions and all were answered. The patient agreed with the plan and demonstrated an understanding of the instructions.   The patient was advised to call back or seek an in-person evaluation if the symptoms worsen or if the condition fails to improve as anticipated.   I provided 1 hour of non-face-to-face time during this encounter.     [name], LCSW, LCAS ________________________________ THERAPIST PROGRESS NOTE   Session Time: 10:00am - 11:00am  Location: Patient: Patient Home Provider: [facility]    Participation Level: Active   Behavioral Response: Alert, anxious mood   Type of Therapy:  Individual Therapy   Treatment Goals addressed: Depression and Anxiety management; Medication Compliance; Family Support/Socialization   Interventions: CBT, healthy boundaries, mindful breathing meditation   Summary: Keyah Blizard is a 49 year old Caucasian female that presented for telephone appointment today with diagnoses of Bipolar Disorder, current episode depressed, severe w/out psychotic features; and PTSD.         Suicidal/Homicidal: None; without plan or intent.    Therapist Response: Clinician spoke with Christella for virtual appointment today by phone, as she remains unable to access virtual sessions.  Clinician assessed for safety, sobriety, and medication  compliance.  Tamaka spoke in a manner that was alert, oriented x5, with no evidence or self-report of SI/HI or A/V H.  Lakishia reported that she continues to take medication as prescribed and denied any use of alcohol or illicit substances.  Aileen denied any manic symptoms.  Clinician inquired about Shakenna's emotional ratings today, as well as any significant changes in thoughts, feelings, or behavior since previous check-in. Terrika reported scores of 2/10 for depression, 7/10 for anxiety, 3/10 for irritability and noted that she has continued having panic attacks daily.  Shayli reported that her biggest trigger remains dealing with her father's worsening health, and she feels powerless to change anything.  Clinician empathized with Nabila and inquired about state of personal boundaries within support network at this time to determine if there is an imbalance contributing to mood.  Marylouise acknowledged that her boundaries remain porous and she is taking on more stress than she can handle, which is causing her to sit up at night with racing thoughts, leading to greatly reduced sleep, and increased irritability in the morning.  Clinician encouraged Tomeca to prioritize time for self-care during this difficult transition and shift focus towards aspects of life she can control, including exploration of positive coping activities throughout schedule to engage in when she is not supporting family.  Glada reported that she has been taking time to read books more often as an occasional escape but constantly finds herself distracted ruminating on worst case scenarios.  Clinician offered to teach Bradee mindful breathing meditation today that she could utilize to disconnect from anxious thoughts and increase present mindedness throughout day.  Clinician invited Danecia to get comfortable, focus on achieving a relaxing breathing  pattern, and then focus on this for 10 minutes, allowing distressing thoughts and feelings to be acknowledged,  but then let go of.  Interventions were effects, as evidenced by Shalissa successfully engaging in activity and noting that she felt more relaxed afterward and was able to avoid ruminating on thoughts after a few minutes of practice.  Clinician encouraged her to include this as part of developing self-care routine for minimum of 5-10 minutes per day and will continue to monitor.       Plan: Follow up again in 2 weeks virtually.   Diagnosis: Bipolar Disorder, current episode depressed, severe w/out psychotic features; and PTSD.   [name], LCSW, LCAS 11/05/20

## 2020-11-26 ENCOUNTER — Other Ambulatory Visit (HOSPITAL_COMMUNITY): Payer: Self-pay | Admitting: Psychiatry

## 2020-11-26 DIAGNOSIS — F4310 Post-traumatic stress disorder, unspecified: Secondary | ICD-10-CM

## 2020-11-26 DIAGNOSIS — F3131 Bipolar disorder, current episode depressed, mild: Secondary | ICD-10-CM

## 2020-11-29 DIAGNOSIS — M47819 Spondylosis without myelopathy or radiculopathy, site unspecified: Secondary | ICD-10-CM | POA: Diagnosis not present

## 2020-12-02 ENCOUNTER — Other Ambulatory Visit: Payer: Self-pay

## 2020-12-02 ENCOUNTER — Ambulatory Visit (INDEPENDENT_AMBULATORY_CARE_PROVIDER_SITE_OTHER): Payer: Medicare Other | Admitting: Licensed Clinical Social Worker

## 2020-12-02 DIAGNOSIS — F4310 Post-traumatic stress disorder, unspecified: Secondary | ICD-10-CM | POA: Diagnosis not present

## 2020-12-02 DIAGNOSIS — F314 Bipolar disorder, current episode depressed, severe, without psychotic features: Secondary | ICD-10-CM | POA: Diagnosis not present

## 2020-12-02 NOTE — Progress Notes (Signed)
Virtual Visit via Telephone Note   I connected with Jodi Wolfe on 12/02/20 at 10:00am by telephone and verified that I am speaking with the correct person using two identifiers.   I discussed the limitations, risks, security and privacy concerns of performing an evaluation and management service by telephone and the availability of in person appointments. I also discussed with the patient that there may be a patient responsible charge related to this service. The patient expressed understanding and agreed to proceed.   I discussed the assessment and treatment plan with the patient. The patient was provided an opportunity to ask questions and all were answered. The patient agreed with the plan and demonstrated an understanding of the instructions.   The patient was advised to call back or seek an in-person evaluation if the symptoms worsen or if the condition fails to improve as anticipated.   I provided 45 minutes of non-face-to-face time during this encounter.     Jodi Stain, LCSW, LCAS ________________________________ THERAPIST PROGRESS NOTE   Session Time: 10:00am - 10:45am  Location: Patient: Patient Home Provider: [facility]    Participation Level: Active   Behavioral Response: Alert, anxious mood   Type of Therapy:  Individual Therapy   Treatment Goals addressed: Depression and Anxiety management; Medication Compliance; Family Support/Socialization; Church attendance; Grief and loss   Interventions: CBT, grief and loss   Summary: Jodi Wolfe is a 49 year old Caucasian female that presented for telephone appointment today with diagnoses of Bipolar Disorder, current episode depressed, severe w/out psychotic features; and PTSD.         Suicidal/Homicidal: None; without plan or intent.    Therapist Response: Clinician spoke with Jodi Wolfe for virtual session today by phone, as she remains unable to access virtual meetings.  Clinician assessed for safety, sobriety, and medication  compliance.  Jodi Wolfe spoke in a manner that was alert, oriented x5, with no evidence or self-report of SI/HI or A/V H.  Jodi Wolfe reported ongoing compliance with medication and denied any use of alcohol or illicit substances.  Jodi Wolfe denied experiencing any manic symptoms.  Clinician inquired about [name]'s current emotional ratings, as well as any significant changes in thoughts, feelings, or behavior since last check-in. [name] reported scores of 2/10 for depression, 7/10 for anxiety, which is consistent with previous check in.  She reported that she has been having panic attacks x2 daily.  Jodi Wolfe stated "This has been a bad week for me.  Jodi Wolfe was the 1 year anniversary of my aunt's passing".  Jodi Wolfe reported that she has cried often, and felt alone as a result, stating "Its like everything has come back".  Clinician normalized Jodi Wolfe's experience with return of grieving cycle, including tendency of unexpected reminders to surface, typical emotional reactions (I.e. anger, anxiety, crying spells, fatigue, guilt, etc), and tips for coping with anniversary period to promote healthy healing and acceptance, such as planning distractions ahead of these dates, reminiscing about positive memories with supports, starting new traditions to celebrate loved ones, engaging with community support via religious institution, and allowing oneself to experience full range of emotions as they arise rather than bottling them up.  Jodi Wolfe reported that she has historically tried to bottle up her emotions and be strong and available for others instead, but is realizing that she needs to approach things differently, so she went to church over the weekend, allowed herself to cry when necessary, and has been checking in with affected family members to share experiences.  She also reported that  there were triggers that reinforced the loss, such as particular songs, TV shows/commercials, and posts on Facebook, so she will be more mindful of how  things affect her emotional state day to day.  Interventions were effective, as evidenced by Jodi Wolfe reporting that it was helpful to discuss her grief today, stating "I learned some new coping skills, how to plan for the future, know more about my triggers, and why I need to talk more to my family about this stuff".  Clinician will continue to monitor.       Plan: Follow up again in 2 weeks virtually.   Diagnosis: Bipolar Disorder, current episode depressed, severe w/out psychotic features; and PTSD.   Jodi Stain, LCSW, LCAS 12/02/20

## 2020-12-03 ENCOUNTER — Encounter: Payer: Self-pay | Admitting: Family Medicine

## 2020-12-03 ENCOUNTER — Other Ambulatory Visit: Payer: Self-pay

## 2020-12-03 ENCOUNTER — Other Ambulatory Visit: Payer: Self-pay | Admitting: Family Medicine

## 2020-12-03 DIAGNOSIS — Z794 Long term (current) use of insulin: Secondary | ICD-10-CM

## 2020-12-03 DIAGNOSIS — M47819 Spondylosis without myelopathy or radiculopathy, site unspecified: Secondary | ICD-10-CM | POA: Diagnosis not present

## 2020-12-03 DIAGNOSIS — E118 Type 2 diabetes mellitus with unspecified complications: Secondary | ICD-10-CM

## 2020-12-03 DIAGNOSIS — G8929 Other chronic pain: Secondary | ICD-10-CM | POA: Diagnosis not present

## 2020-12-03 DIAGNOSIS — M5442 Lumbago with sciatica, left side: Secondary | ICD-10-CM | POA: Diagnosis not present

## 2020-12-03 DIAGNOSIS — Z79899 Other long term (current) drug therapy: Secondary | ICD-10-CM | POA: Diagnosis not present

## 2020-12-03 DIAGNOSIS — G629 Polyneuropathy, unspecified: Secondary | ICD-10-CM | POA: Diagnosis not present

## 2020-12-03 MED ORDER — OZEMPIC (1 MG/DOSE) 2 MG/1.5ML ~~LOC~~ SOPN: 1.0000 mg | PEN_INJECTOR | SUBCUTANEOUS | Status: DC

## 2020-12-09 ENCOUNTER — Telehealth (INDEPENDENT_AMBULATORY_CARE_PROVIDER_SITE_OTHER): Payer: Medicare Other | Admitting: Psychiatry

## 2020-12-09 ENCOUNTER — Other Ambulatory Visit: Payer: Self-pay

## 2020-12-09 ENCOUNTER — Other Ambulatory Visit (HOSPITAL_COMMUNITY): Payer: Self-pay | Admitting: Psychiatry

## 2020-12-09 ENCOUNTER — Encounter (HOSPITAL_COMMUNITY): Payer: Self-pay | Admitting: Psychiatry

## 2020-12-09 DIAGNOSIS — F3131 Bipolar disorder, current episode depressed, mild: Secondary | ICD-10-CM | POA: Diagnosis not present

## 2020-12-09 DIAGNOSIS — F4310 Post-traumatic stress disorder, unspecified: Secondary | ICD-10-CM

## 2020-12-09 DIAGNOSIS — F410 Panic disorder [episodic paroxysmal anxiety] without agoraphobia: Secondary | ICD-10-CM

## 2020-12-09 MED ORDER — CHLORPROMAZINE HCL 50 MG PO TABS: 50.0000 mg | ORAL_TABLET | Freq: Every day | ORAL | 0 refills | Status: DC

## 2020-12-09 MED ORDER — LAMOTRIGINE 150 MG PO TABS
300.0000 mg | ORAL_TABLET | Freq: Every day | ORAL | 0 refills | Status: DC
Start: 2020-12-09 — End: 2021-03-10

## 2020-12-09 MED ORDER — CLONAZEPAM 0.5 MG PO TABS: 0.5000 mg | ORAL_TABLET | Freq: Four times a day (QID) | ORAL | 2 refills | Status: DC | PRN
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
## 2020-12-13 ENCOUNTER — Telehealth: Payer: Self-pay | Admitting: Family Medicine

## 2020-12-13 NOTE — Chronic Care Management (AMB) (Signed)
  Chronic Care Management   Note  12/13/2020 Name: [name] MRN: [id number] DOB: 02/05/1971  Jodi Wolfe is a 49 y.o. year old female who is a primary care patient of Willow Ora, MD. I reached out to [name] by phone today in response to a referral sent by [name] PCP, Willow Ora, MD.   Ms. Saline was given information about Chronic Care Management services today including:  1. CCM service includes personalized support from designated clinical staff supervised by her physician, including individualized plan of care and coordination with other care providers 2. 24/7 contact phone numbers for assistance for urgent and routine care needs. 3. Service will only be billed when office clinical staff spend 20 minutes or more in a month to coordinate care. 4. Only one practitioner may furnish and bill the service in a calendar month. 5. The patient may stop CCM services at any time (effective at the end of the month) by phone call to the office staff.   Patient agreed to services and verbal consent obtained.   Follow up plan:   Carmell [place] [employer] Scheduler

## 2020-12-16 ENCOUNTER — Telehealth (HOSPITAL_COMMUNITY): Payer: Medicare Other | Admitting: Psychiatry

## 2020-12-24 ENCOUNTER — Ambulatory Visit (HOSPITAL_COMMUNITY): Payer: Medicare Other | Admitting: Licensed Clinical Social Worker

## 2020-12-30 ENCOUNTER — Ambulatory Visit (HOSPITAL_COMMUNITY): Payer: Medicare Other | Admitting: Licensed Clinical Social Worker

## 2020-12-30 DIAGNOSIS — Z79899 Other long term (current) drug therapy: Secondary | ICD-10-CM | POA: Diagnosis not present

## 2020-12-30 DIAGNOSIS — Z1211 Encounter for screening for malignant neoplasm of colon: Secondary | ICD-10-CM | POA: Diagnosis not present

## 2020-12-30 DIAGNOSIS — Z1159 Encounter for screening for other viral diseases: Secondary | ICD-10-CM | POA: Diagnosis not present

## 2020-12-30 DIAGNOSIS — E559 Vitamin D deficiency, unspecified: Secondary | ICD-10-CM | POA: Diagnosis not present

## 2020-12-30 DIAGNOSIS — R5383 Other fatigue: Secondary | ICD-10-CM | POA: Diagnosis not present

## 2020-12-30 DIAGNOSIS — E1165 Type 2 diabetes mellitus with hyperglycemia: Secondary | ICD-10-CM | POA: Diagnosis not present

## 2020-12-30 DIAGNOSIS — M539 Dorsopathy, unspecified: Secondary | ICD-10-CM | POA: Diagnosis not present

## 2020-12-30 DIAGNOSIS — M47819 Spondylosis without myelopathy or radiculopathy, site unspecified: Secondary | ICD-10-CM | POA: Diagnosis not present

## 2020-12-30 DIAGNOSIS — Z0000 Encounter for general adult medical examination without abnormal findings: Secondary | ICD-10-CM | POA: Diagnosis not present

## 2020-12-30 DIAGNOSIS — R0602 Shortness of breath: Secondary | ICD-10-CM | POA: Diagnosis not present

## 2020-12-31 ENCOUNTER — Ambulatory Visit (INDEPENDENT_AMBULATORY_CARE_PROVIDER_SITE_OTHER): Payer: Medicare Other | Admitting: Licensed Clinical Social Worker

## 2020-12-31 ENCOUNTER — Other Ambulatory Visit: Payer: Self-pay

## 2020-12-31 DIAGNOSIS — F4310 Post-traumatic stress disorder, unspecified: Secondary | ICD-10-CM | POA: Diagnosis not present

## 2020-12-31 DIAGNOSIS — F314 Bipolar disorder, current episode depressed, severe, without psychotic features: Secondary | ICD-10-CM
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
## 2021-01-14 ENCOUNTER — Ambulatory Visit (HOSPITAL_COMMUNITY): Payer: Medicare Other | Admitting: Licensed Clinical Social Worker

## 2021-01-14 ENCOUNTER — Telehealth (HOSPITAL_COMMUNITY): Payer: Self-pay | Admitting: Licensed Clinical Social Worker

## 2021-01-14 ENCOUNTER — Other Ambulatory Visit: Payer: Self-pay

## 2021-01-14 NOTE — Telephone Encounter (Signed)
Jodi Wolfe had a virtual therapy appointment scheduled today at 10am.  Clinician outreached her by phone at scheduled time, but Jodi Wolfe reported that she would be unable to engage in session today because she was staying at a family member's home watching their children and would not have privacy.  She reported that she would reschedule for another day.  Clinician informed front desk staff of cancellation and will continue to monitor.    [name], [place], LCAS 01/14/21

## 2021-01-15 ENCOUNTER — Other Ambulatory Visit: Payer: Self-pay

## 2021-01-15 ENCOUNTER — Encounter: Payer: Self-pay | Admitting: Family Medicine

## 2021-01-15 MED ORDER — NITROFURANTOIN MONOHYD MACRO 100 MG PO CAPS: 100.0000 mg | ORAL_CAPSULE | Freq: Two times a day (BID) | ORAL | 0 refills | Status: DC

## 2021-01-15 NOTE — Telephone Encounter (Signed)
Can rec urgent care to be sure, but if classic, can order macrobid 100 po bid x 5 days. OV if not improved.  And needs OV for diabetes. Thanks.

## 2021-01-15 NOTE — Telephone Encounter (Signed)
Pt needs an appointment.  Also, she was due for CPE and diabetes f/u in October.  Please schedule her for diabetes/HLD visit. Can set up appt for UTI this week.  Thanks.

## 2021-01-16 ENCOUNTER — Telehealth: Payer: Self-pay

## 2021-01-16 NOTE — Chronic Care Management (AMB) (Signed)
Chronic Care Management Pharmacy Assistant   Name: Jodi Wolfe  MRN: [id number] DOB: 03/03/1971  Reason for Encounter: Medication Review / Initial Visit   [name],  50 y.o. , female presents for their Initial CCM visit with the clinical pharmacist via telephone due to COVID-19 Pandemic.  PCP : Leamon Arnt, MD  Allergies:   Allergies  Allergen Reactions  . Morphine And Related   . Sulfa Antibiotics     Medications: Outpatient Encounter Medications as of 01/16/2021  Medication Sig  . atorvastatin (LIPITOR) 10 MG tablet TAKE 1 TABLET BY MOUTH EVERY DAY  . blood glucose meter kit and supplies KIT Dispense based on patient and insurance preference. Use up to four times daily as directed. (FOR ICD-9 250.00, 250.01).  . chlorproMAZINE (THORAZINE) 50 MG tablet Take 1 tablet (50 mg total) by mouth at bedtime.  . clonazePAM (KLONOPIN) 0.5 MG tablet Take 1 tablet (0.5 mg total) by mouth 4 (four) times daily as needed for anxiety.  . dapagliflozin propanediol (FARXIGA) 10 MG TABS tablet TAKE 1 TABLET BY MOUTH EVERY DAY BEFORE BREAKFAST  . DEXILANT 60 MG capsule TAKE 1 CAPSULE BY MOUTH EVERY DAY  . gabapentin (NEURONTIN) 600 MG tablet Take 1.5 tablets (900 mg total) by mouth 3 (three) times daily.  Marland Kitchen HYDROcodone-acetaminophen (NORCO) 10-325 MG tablet Take 1 tablet by mouth every 8 (eight) hours as needed for severe pain (Chronic pain).  Marland Kitchen lamoTRIgine (LAMICTAL) 150 MG tablet Take 2 tablets (300 mg total) by mouth daily.  . Lancets (ONETOUCH DELICA PLUS [id number]) MISC USE UP TO 4 TIMES A DAY AS DIRECTED  . lidocaine (LIDODERM) 5 % PLACE 1 PATCH ONTO THE SKIN DAILY. REMOVE & DISCARD PATCH WITHIN 12 HOURS OR AS DIRECTED BY MD  . naproxen (NAPROSYN) 500 MG tablet Take 0.5 tablets (250 mg total) by mouth 2 (two) times daily with a meal. (Patient not taking: Reported on 07/15/2020)  . NARCAN 4 MG/0.1ML LIQD nasal spray kit SMARTSIG:1 Spray(s) Both Nares Once PRN  . nitrofurantoin,  macrocrystal-monohydrate, (MACROBID) 100 MG capsule Take 1 capsule (100 mg total) by mouth 2 (two) times daily.  . ondansetron (ZOFRAN-ODT) 4 MG disintegrating tablet TAKE 1 TABLET BY MOUTH EVERY 8 HOURS AS NEEDED FOR NAUSEA AND VOMITING  . ONETOUCH ULTRA test strip USE UP TO 4 TIMES A DAY AS DIRECTED  . Semaglutide, 1 MG/DOSE, (OZEMPIC, 1 MG/DOSE,) 2 MG/1.5ML SOPN Inject 1 mg into the skin once a week.  . [DISCONTINUED] esomeprazole (NEXIUM) 40 MG capsule TAKE 1 CAPSULE BY MOUTH EVERY DAY   No facility-administered encounter medications on file as of 01/16/2021.    Current Diagnosis: Patient Active Problem List   Diagnosis Date Noted  . Chronic narcotic dependence ([place]) 01/22/2020  . High risk medication use 01/22/2020  . Perimenopausal vasomotor symptoms 10/18/2019  . Dyspepsia 02/16/2019  . Hyperlipidemia associated with type 2 diabetes mellitus ([place]) 02/16/2019  . Lumbar radiculopathy 11/21/2018  . Chronic back pain - pain management [facility] 11/10/2018  . PTSD (post-traumatic stress disorder) 11/10/2018  . Bipolar depression ([place]), followed by Psychiatry 11/10/2018  . Class 3 severe obesity due to excess calories with serious comorbidity and body mass index (BMI) of 40.0 to 44.9 in adult ([place]) 04/19/2017  . GAD (generalized anxiety disorder) 04/19/2017  . Psychophysiological insomnia 04/19/2017  . Type 2 diabetes mellitus with complication, with long-term current use of insulin ([place]) 04/19/2017    Have you seen any other providers since your last visit?   Patient  see's her Counselor regularly once a month, Shade Flood for Bipolar Disorder, 10/01/2020 [facility], Beverley Fiedler, MD, 12/09/2020 Telemedicine [facility], Arlyce Harman MD, no medication changes noted during these visits.  Any changes in your medications or health?  Patient states "my A1c level recently went up".  Any side effects from any medications?   Patient denies having any side effects from any medications at  this time.  Do you have any symptoms or problems not managed by your medications?  Patient states she does not have any symptoms or problems at this time that are not managed by her medications.  Any concerns about your health right now?  Patient states she is concerned about her diabetes. Patient states she would like to "manage it better". Patient also expresses concerns about her "anxiety issues and panic attacks".   Has your provider asked that you check blood pressure, blood sugar, or follow special diet at home?  Patient states she has a wrist cuff at home but don't normally check her blood pressure as it gets checked monthly when she goes to pain management. Patient states she is supposed to check her blood sugars 3 times a day Patient states she only checks 2 times daily, once in morning before eating breakfast and at night before bed. Patient states her readings fluctuate between 109-330. Patient states she is supposed to follow a special diet due to her diabetes. Patient admits she does not follow it like she should.  Do you get any type of exercise on a regular basis?  Patient states she does not exercise on a regular basis. Patient states she is a very active person with 4 kids and 3 grand kids.  Can you think of a goal you would like to reach for your health?  Patient states she would like to lose weight as a goal for her health.  Do you have any problems getting your medications?  Patient states she doesn't have any problems getting her medications. However, she states she depends on family and friends to give her a ride to the pharmacy when she needs it, she doesn't drive.  Is there anything that you would like to discuss during the appointment?   Patient states she has trouble with transportation as she does not drive. Patient states she is interested in learning more about [facility], she is concerned about potentially having medications delivered as she takes some  controlled medications and isn't home that often during the week.  Please have your medications and supplements available during your appointment.  April D Calhoun, [place] Pharmacist Assistant [phone]   Follow-Up:  Pharmacist Review

## 2021-01-21 ENCOUNTER — Ambulatory Visit: Payer: Medicare Other

## 2021-01-21 DIAGNOSIS — E1169 Type 2 diabetes mellitus with other specified complication: Secondary | ICD-10-CM

## 2021-01-21 DIAGNOSIS — Z794 Long term (current) use of insulin: Secondary | ICD-10-CM

## 2021-01-21 DIAGNOSIS — E785 Hyperlipidemia, unspecified: Secondary | ICD-10-CM

## 2021-01-21 DIAGNOSIS — E118 Type 2 diabetes mellitus with unspecified complications: Secondary | ICD-10-CM

## 2021-01-21 NOTE — Progress Notes (Signed)
Chronic Care Management Pharmacy Name: Jodi Wolfe     MRN: [id number]     DOB: [birth year]  Chief Complaint/ HPI Jodi Wolfe, 50 y.o., female, presents for their initial CCM visit with the clinical pharmacist via telephone due to COVID-19 pandemic.  PCP: Jodi Arnt, MD Encounter Diagnoses  Name Primary?  . Type 2 diabetes mellitus with complication, with long-term current use of insulin ([place]) Yes  . Hyperlipidemia associated with type 2 diabetes mellitus [facility])    Patient Active Problem List   Diagnosis Date Noted  . Chronic narcotic dependence ([place]) 01/22/2020  . High risk medication use 01/22/2020  . Perimenopausal vasomotor symptoms 10/18/2019  . Dyspepsia 02/16/2019  . Hyperlipidemia associated with type 2 diabetes mellitus ([place]) 02/16/2019  . Lumbar radiculopathy 11/21/2018  . Chronic back pain - pain management [facility] 11/10/2018  . PTSD (post-traumatic stress disorder) 11/10/2018  . Bipolar depression ([place]), followed by Psychiatry 11/10/2018  . Class 3 severe obesity due to excess calories with serious comorbidity and body mass index (BMI) of 40.0 to 44.9 in adult ([place]) 04/19/2017  . GAD (generalized anxiety disorder) 04/19/2017  . Psychophysiological insomnia 04/19/2017  . Type 2 diabetes mellitus with complication, with long-term current use of insulin ([place]) 04/19/2017   Past Surgical History:  Procedure Laterality Date  . APPENDECTOMY    . CHOLECYSTECTOMY    . TONSILLECTOMY AND ADENOIDECTOMY     Family History  Problem Relation Age of Onset  . Hyperlipidemia Mother   . Hypertension Mother   . Stroke Mother   . Depression Mother   . Drug abuse Mother   . Hyperlipidemia Father   . Hypertension Father   . Stroke Father   . Bipolar disorder Father   . Hyperlipidemia Brother   . Hypertension Brother   . Anxiety disorder Brother    Social History   Social History Narrative   She is divorced with 4 grown children (3 sons, 1 daughter).    Moved to  [place] to take care of her granddaughter.    Allergies  Allergen Reactions  . Morphine And Related   . Sulfa Antibiotics    Outpatient Encounter Medications as of 01/21/2021  Medication Sig  . atorvastatin (LIPITOR) 10 MG tablet TAKE 1 TABLET BY MOUTH EVERY DAY  . chlorproMAZINE (THORAZINE) 50 MG tablet Take 1 tablet (50 mg total) by mouth at bedtime.  . clonazePAM (KLONOPIN) 0.5 MG tablet Take 1 tablet (0.5 mg total) by mouth 4 (four) times daily as needed for anxiety.  . dapagliflozin propanediol (FARXIGA) 10 MG TABS tablet TAKE 1 TABLET BY MOUTH EVERY DAY BEFORE BREAKFAST  . DEXILANT 60 MG capsule TAKE 1 CAPSULE BY MOUTH EVERY DAY  . gabapentin (NEURONTIN) 600 MG tablet Take 1.5 tablets (900 mg total) by mouth 3 (three) times daily.  Marland Kitchen HYDROcodone-acetaminophen (NORCO) 10-325 MG tablet Take 1 tablet by mouth every 8 (eight) hours as needed for severe pain (Chronic pain).  Marland Kitchen lamoTRIgine (LAMICTAL) 150 MG tablet Take 2 tablets (300 mg total) by mouth daily.  . Semaglutide, 1 MG/DOSE, (OZEMPIC, 1 MG/DOSE,) 2 MG/1.5ML SOPN Inject 1 mg into the skin once a week.  . blood glucose meter kit and supplies KIT Dispense based on patient and insurance preference. Use up to four times daily as directed. (FOR ICD-9 250.00, 250.01).  . Lancets (ONETOUCH DELICA PLUS [id number]) MISC USE UP TO 4 TIMES A DAY AS DIRECTED  . lidocaine (LIDODERM) 5 % PLACE 1 PATCH ONTO THE SKIN DAILY. REMOVE & DISCARD  PATCH WITHIN 12 HOURS OR AS DIRECTED BY MD (Patient not taking: Reported on 01/21/2021)  . naproxen (NAPROSYN) 500 MG tablet Take 0.5 tablets (250 mg total) by mouth 2 (two) times daily with a meal. (Patient not taking: No sig reported)  . NARCAN 4 MG/0.1ML LIQD nasal spray kit SMARTSIG:1 Spray(s) Both Nares Once PRN  . nitrofurantoin, macrocrystal-monohydrate, (MACROBID) 100 MG capsule Take 1 capsule (100 mg total) by mouth 2 (two) times daily.  . ondansetron (ZOFRAN-ODT) 4 MG disintegrating tablet TAKE 1  TABLET BY MOUTH EVERY 8 HOURS AS NEEDED FOR NAUSEA AND VOMITING (Patient not taking: Reported on 01/21/2021)  . ONETOUCH ULTRA test strip USE UP TO 4 TIMES A DAY AS DIRECTED  . [DISCONTINUED] esomeprazole (NEXIUM) 40 MG capsule TAKE 1 CAPSULE BY MOUTH EVERY DAY   No facility-administered encounter medications on file as of 01/21/2021.   Patient Care Team    Relationship Specialty Notifications Start End  Jodi Arnt, MD PCP - General Family Medicine  01/22/20   Jodi Wolfe, Jodi Harman, MD Consulting Physician Psychiatry  05/12/19   Jodi Sinning, MD Consulting Physician Physical Medicine and Rehabilitation  05/12/19   Jodi Wolfe, Jodi Wolfe  Pain Medicine  06/17/20    Comment: Jodi Wolfe, [facility]  Pharmacist  12/13/20   Jodi Wolfe, [facility] Pharmacist Pharmacist  12/13/20    Current Diagnosis/Assessment: Goals Addressed            This Visit's Progress   . PharmD Care Plan       CARE PLAN ENTRY (see longitudinal plan of care for additional care plan information)  Current Barriers:  . Chronic Disease Management support, education, and care coordination needs related to Hyperlipidemia and Diabetes Hyperlipidemia Lab Results  Component Value Date/Time   LDLCALC 74 08/12/2020 09:06 AM   . Pharmacist Clinical Goal(s): o Over the next 365 days, patient will work with PharmD and providers to maintain LDL goal < 100 . Current regimen:  o Atorvastatin 10 mg once daily . Interventions: o We discussed cholesterol goals and medication related side effects - at goal, no problems noted.  . Patient self care activities - Over the next 365 days, patient will: o Continue current management  Diabetes Lab Results  Component Value Date/Time   HGBA1C 6.1 (H) 08/12/2020 09:06 AM   HGBA1C 6.2 (A) 01/22/2020 03:28 PM   HGBA1C 6.9 (H) 10/18/2019 08:40 AM   . Pharmacist Clinical Goal(s): o Over the next 180 days, patient will work with PharmD and providers to maintain A1c goal <6.5% . Current  regimen:  . Farxiga 10 mg once daily at night . Ozempic 1 mg into the skin once a week . Interventions: o We discussed DM plate method/dietary goals - goal to reduce intake of sugary drinks such as koolaid and soda to 1 drink/day. Handout provided. Discuss DM nutrition counseling/possible referral to help with diet. . Patient self care activities - Over the next 180 days, patient will: o Check blood sugar daily, document, and provide at future appointments o Contact provider with any episodes of hypoglycemia  Medication management . Pharmacist Clinical Goal(s): o Over the next 365 days, patient will work with PharmD and providers to achieve optimal medication adherence . Current pharmacy: [facility]  . Interventions o Comprehensive medication review performed. o Continue current medication management strategy . Patient self care activities - Over the next 365 days, patient will: o Focus on medication adherence by taking medications as prescribed o Report any questions or concerns  to PharmD and/or provider(s) Initial goal documentation.      Diabetes   A1c goal < 6.5%  Lab Results  Component Value Date/Time   HGBA1C 6.1 (H) 08/12/2020 09:06 AM   HGBA1C 6.2 (A) 01/22/2020 03:28 PM   HGBA1C 6.9 (H) 10/18/2019 08:40 AM   HGBA1C 7.5 (H) 05/29/2019 04:14 PM   HGBA1C 7.7 (H) 02/13/2019 04:24 PM   HGBA1C 7.1 (A) 07/26/2018 03:13 PM   HGBA1C 10.0 04/19/2018 04:58 PM   HGBA1C 11.9 (H) 11/29/2017 03:14 PM   MICROALBUR 2.2 (H) 05/29/2019 04:14 PM   MICROALBUR 2.4 (H) 04/19/2018 04:51 PM   MICRALBCREAT 1.3 05/29/2019 04:14 PM   MICRALBCREAT 4.7 04/19/2018 04:51 PM   GFR 83.47 10/18/2019 08:40 AM   GFR 98.85 05/29/2019 04:14 PM   GFR 113.13 02/13/2019 04:24 PM   GFR 113.91 11/29/2017 03:14 PM   GFR 112.02 05/17/2017 09:25 AM    Previous medications: Victoza (n/v), metformin.  Recent FBG readings: higher in morning, possibly due due to evening snacks such as  cake/donuts. Throughout day BG can range 100s to ~350.  Example snacks/drinks/meals: 2 sodas, 1-2 glasses of koolaid. Likes cakes, donut sticks sometimes before bed Bananas, grapes, watermelon. Likes salads, green breens. Some corn, potatoes. Frozen dinner, hamburgers and fries. Needs assistance with transportation / would like help accessing fresh foods - would be open with community resource referral and as referral for diabetes nutrition counseling.  Reports recent a1c done at [facility] was somewhere in the range of 7.1-7.4% last month.  Patient is currently at goal on: Marland Kitchen Farxiga 10 mg once daily at night . Ozempic 1 mg into the skin once a week  We discussed DM plate method/dietary goals - goal to reduce intake of sugary drinks such as koolaid and soda to 1 drink/day. Handout provided. Discuss DM nutrition counseling/possible referral to help with diet.  Plan  Continue current medications. Recommend referral to DM nutrition counseling. Recommend referral to community resources (access to food, transportation).  Hyperlipidemia   LDL goal < 100  Lipid Panel     Component Value Date/Time   CHOL 146 08/12/2020 0906   CHOL 175 05/29/2019 1614   CHOL 163 02/13/2019 1624   TRIG 119 08/12/2020 0906   TRIG 155.0 (H) 05/29/2019 1614   TRIG 143.0 02/13/2019 1624   HDL 51 08/12/2020 0906   HDL 50.60 05/29/2019 1614   HDL 60.30 02/13/2019 1624   LDLCALC 74 08/12/2020 0906   LDLCALC 94 05/29/2019 1614   LDLCALC 74 02/13/2019 1624   LDLCALC 149 (H) 04/19/2017 1058    Hepatic Function Latest Ref Rng & Units 08/12/2020 10/18/2019 05/29/2019  Total Protein 6.1 - 8.1 g/dL 7.3 7.9 7.6  Albumin 3.5 - 5.2 g/dL - 4.8 4.4  AST 10 - 35 U/L $Remo'10 14 14  'anXNl$ ALT 6 - 29 U/L 10 14 40(H)  Alk Phosphatase 39 - 117 U/L - 112 135(H)  Total Bilirubin 0.2 - 1.2 mg/dL 0.5 0.5 0.3    The ASCVD Risk score ([place]., et al., 2013) failed to calculate for the following reasons:   The systolic blood pressure is  missing   Previous medications: none noted. Patient is currently at goal the following medications:  . Atorvastatin 10 mg once daily   CVD prevention. On ASA. Denies any abnormal bruising, bleeding from nose or gums or blood in urine or stool. Patient is currently controlled on the following medications:  . Aspirin 81 mg once daily  We discussed cholesterol goals and  medication related side effects - at goal, no problems noted.   Plan  Continue current medications.  GERD   No results found for: VITAMINB12 Previous medications: nexium  Feels Dexilant is helping a lot with acid reflux. Identifies eating late at night as trigger.  Currently controlled on: Marland Kitchen Dexilant 60 mg once every day  We discussed: Avoidance of potential triggers such as alcohol, fatty foods, lying down after eating, and tomato sauce.  Plan   Continue current medications.  Medication Management / Care Coordination   [facility]ce[facility] medications [facility]:  [id number] [address], [place] [address] Phone: [phone] Fax: [phone]   Would be interested in pharmacy services, may [facility]  Would like cost review on other medications to see if this may be a reasonable option.  Plan  Continue current medication management strategy. ___________________________ SDOH (Social Determinants of Health) assessments performed: Yes. Future Appointments  Date Time Provider [place]  02/17/2021  1:30 PM Jodi Arnt, MD [facility]  03/10/2021 10:00 AM Arfeen, Jodi Harman, MD [facility] None  04/22/2021 10:30 AM [facility] CCM PHARMACIST [facility]  07/21/2021 10:15 AM [facility] HEALTH COACH [facility]   Visit follow-up:  . CPA follow-up: 2 month DM call/pharmacy services  . RPH follow-up: 3 month f/u. Review progress with diet.  Jodi Wolfe, Pharm.D., BCGP Clinical Pharmacist [facility] [phone]

## 2021-01-23 NOTE — Patient Instructions (Addendum)
Jodi Wolfe,  Thank you for taking the time to review your medications with me today.  I have included our care plan/goals in the following pages. Please review and call me at [phone] with any questions!  Thanks! [name], Pharm.D., BCGP Clinical Pharmacist [facility] [phone]   Goals Addressed            This Visit's Progress   . PharmD Care Plan       CARE PLAN ENTRY (see longitudinal plan of care for additional care plan information)  Current Barriers:  . Chronic Disease Management support, education, and care coordination needs related to Hyperlipidemia and Diabetes Hyperlipidemia Lab Results  Component Value Date/Time   LDLCALC 74 08/12/2020 09:06 AM   . Pharmacist Clinical Goal(s): o Over the next 365 days, patient will work with PharmD and providers to maintain LDL goal < 100 . Current regimen:  o Atorvastatin 10 mg once daily . Interventions: o We discussed cholesterol goals and medication related side effects - at goal, no problems noted.  . Patient self care activities - Over the next 365 days, patient will: o Continue current management  Diabetes Lab Results  Component Value Date/Time   HGBA1C 6.1 (H) 08/12/2020 09:06 AM   HGBA1C 6.2 (A) 01/22/2020 03:28 PM   HGBA1C 6.9 (H) 10/18/2019 08:40 AM   . Pharmacist Clinical Goal(s): o Over the next 180 days, patient will work with PharmD and providers to maintain A1c goal <6.5% . Current regimen:  . Farxiga 10 mg once daily at night . Ozempic 1 mg into the skin once a week . Interventions: o We discussed DM plate method/dietary goals - goal to reduce intake of sugary drinks such as koolaid and soda to 1 drink/day. Handout provided. Discuss DM nutrition counseling/possible referral to help with diet. . Patient self care activities - Over the next 180 days, patient will: o Check blood sugar daily, document, and provide at future appointments o Contact provider  with any episodes of hypoglycemia  Medication management . Pharmacist Clinical Goal(s): o Over the next 365 days, patient will work with PharmD and providers to achieve optimal medication adherence . Current pharmacy: [facility]  . Interventions o Comprehensive medication review performed. o Continue current medication management strategy . Patient self care activities - Over the next 365 days, patient will: o Focus on medication adherence by taking medications as prescribed o Report any questions or concerns to PharmD and/or provider(s) Initial goal documentation.      Jodi Wolfe was given information about Chronic Care Management services today including:  1. CCM service includes personalized support from designated clinical staff supervised by her physician, including individualized plan of care and coordination with other care providers 2. 24/7 contact phone numbers for assistance for urgent and routine care needs. 3. Standard insurance, coinsurance, copays and deductibles apply for chronic care management only during months in which we provide at least 20 minutes of these services. Most insurances cover these services at 100%, however patients may be responsible for any copay, coinsurance and/or deductible if applicable. This service may help you avoid the need for more expensive face-to-face services. 4. Only one practitioner may furnish and bill the service in a calendar month. 5. The patient may stop CCM services at any time (effective at the end of the month) by phone call to the office staff.  Patient agreed to services and verbal consent obtained.   The patient verbalized  understanding of instructions provided today and agreed to receive a MyChartcopy of patient instruction and/or educational materials. Telephone follow up appointment with pharmacy team member scheduled for: See next appointment with "Care Management Staff" under "What's Next" below.   Dahlia Byes, Pharm.D.,  BCGP Clinical Pharmacist [facility] [phone]  Diabetes Mellitus and Nutrition, Adult When you have diabetes, or diabetes mellitus, it is very important to have healthy eating habits because your blood sugar (glucose) levels are greatly affected by what you eat and drink. Eating healthy foods in the right amounts, at about the same times every day, can help you:  Control your blood glucose.  Lower your risk of heart disease.  Improve your blood pressure.  Reach or maintain a healthy weight. What can affect my meal plan? Every person with diabetes is different, and each person has different needs for a meal plan. Your health care provider may recommend that you work with a dietitian to make a meal plan that is best for you. Your meal plan may vary depending on factors such as:  The calories you need.  The medicines you take.  Your weight.  Your blood glucose, blood pressure, and cholesterol levels.  Your activity level.  Other health conditions you have, such as heart or kidney disease. How do carbohydrates affect me? Carbohydrates, also called carbs, affect your blood glucose level more than any other type of food. Eating carbs naturally raises the amount of glucose in your blood. Carb counting is a method for keeping track of how many carbs you eat. Counting carbs is important to keep your blood glucose at a healthy level, especially if you use insulin or take certain oral diabetes medicines. It is important to know how many carbs you can safely have in each meal. This is different for every person. Your dietitian can help you calculate how many carbs you should have at each meal and for each snack. How does alcohol affect me? Alcohol can cause a sudden decrease in blood glucose (hypoglycemia), especially if you use insulin or take certain oral diabetes medicines. Hypoglycemia can be a life-threatening condition. Symptoms of hypoglycemia, such as  sleepiness, dizziness, and confusion, are similar to symptoms of having too much alcohol.  Do not drink alcohol if: ? Your health care provider tells you not to drink. ? You are pregnant, may be pregnant, or are planning to become pregnant.  If you drink alcohol: ? Do not drink on an empty stomach. ? Limit how much you use to:  0-1 drink a day for women.  0-2 drinks a day for men. ? Be aware of how much alcohol is in your drink. In the [place] one drink equals one 12 oz bottle of beer (355 mL), one 5 oz glass of wine (148 mL), or one 1 oz glass of hard liquor (44 mL). ? Keep yourself hydrated with water, diet soda, or unsweetened iced tea.  Keep in mind that regular soda, juice, and other mixers may contain a lot of sugar and must be counted as carbs. What are tips for following this plan? Reading food labels  Start by checking the serving size on the "Nutrition Facts" label of packaged foods and drinks. The amount of calories, carbs, fats, and other nutrients listed on the label is based on one serving of the item. Many items contain more than one serving per package.  Check the total grams (g) of carbs in one serving. You can calculate the  number of servings of carbs in one serving by dividing the total carbs by 15. For example, if a food has 30 g of total carbs per serving, it would be equal to 2 servings of carbs.  Check the number of grams (g) of saturated fats and trans fats in one serving. Choose foods that have a low amount or none of these fats.  Check the number of milligrams (mg) of salt (sodium) in one serving. Most people should limit total sodium intake to less than 2,300 mg per day.  Always check the nutrition information of foods labeled as "low-fat" or "nonfat." These foods may be higher in added sugar or refined carbs and should be avoided.  Talk to your dietitian to identify your daily goals for nutrients listed on the label. Shopping  Avoid buying canned, pre-made,  or processed foods. These foods tend to be high in fat, sodium, and added sugar.  Shop around the outside edge of the grocery store. This is where you will most often find fresh fruits and vegetables, bulk grains, fresh meats, and fresh dairy. Cooking  Use low-heat cooking methods, such as baking, instead of high-heat cooking methods like deep frying.  Cook using healthy oils, such as olive, canola, or sunflower oil.  Avoid cooking with butter, cream, or high-fat meats. Meal planning  Eat meals and snacks regularly, preferably at the same times every day. Avoid going long periods of time without eating.  Eat foods that are high in fiber, such as fresh fruits, vegetables, beans, and whole grains. Talk with your dietitian about how many servings of carbs you can eat at each meal.  Eat 4-6 oz (112-168 g) of lean protein each day, such as lean meat, chicken, fish, eggs, or tofu. One ounce (oz) of lean protein is equal to: ? 1 oz (28 g) of meat, chicken, or fish. ? 1 egg. ?  cup (62 g) of tofu.  Eat some foods each day that contain healthy fats, such as avocado, nuts, seeds, and fish.   What foods should I eat? Fruits Berries. Apples. Oranges. Peaches. Apricots. Plums. Grapes. Mango. Papaya. Pomegranate. Kiwi. Cherries. Vegetables Lettuce. Spinach. Leafy greens, including kale, chard, collard greens, and mustard greens. Beets. Cauliflower. Cabbage. Broccoli. Carrots. Green beans. Tomatoes. Peppers. Onions. Cucumbers. Brussels sprouts. Grains Whole grains, such as whole-wheat or whole-grain bread, crackers, tortillas, cereal, and pasta. Unsweetened oatmeal. Quinoa. Brown or wild rice. Meats and other proteins Seafood. Poultry without skin. Lean cuts of poultry and beef. Tofu. Nuts. Seeds. Dairy Low-fat or fat-free dairy products such as milk, yogurt, and cheese. The items listed above may not be a complete list of foods and beverages you can eat. Contact a dietitian for more  information. What foods should I avoid? Fruits Fruits canned with syrup. Vegetables Canned vegetables. Frozen vegetables with butter or cream sauce. Grains Refined white flour and flour products such as bread, pasta, snack foods, and cereals. Avoid all processed foods. Meats and other proteins Fatty cuts of meat. Poultry with skin. Breaded or fried meats. Processed meat. Avoid saturated fats. Dairy Full-fat yogurt, cheese, or milk. Beverages Sweetened drinks, such as soda or iced tea. The items listed above may not be a complete list of foods and beverages you should avoid. Contact a dietitian for more information. Questions to ask a health care provider  Do I need to meet with a diabetes educator?  Do I need to meet with a dietitian?  What number can I call if I have questions?  When are the best times to check my blood glucose? Where to find more information:  American Diabetes Association: diabetes.org  Academy of Nutrition and Dietetics: www.eatright.[employer] of Diabetes and Digestive and Kidney Diseases: CarFlippers.tn  Association of Diabetes Care and Education Specialists: www.diabeteseducator.org Summary  It is important to have healthy eating habits because your blood sugar (glucose) levels are greatly affected by what you eat and drink.  A healthy meal plan will help you control your blood glucose and maintain a healthy lifestyle.  Your health care provider may recommend that you work with a dietitian to make a meal plan that is best for you.  Keep in mind that carbohydrates (carbs) and alcohol have immediate effects on your blood glucose levels. It is important to count carbs and to use alcohol carefully. This information is not intended to replace advice given to you by your health care provider. Make sure you discuss any questions you have with your health care provider. Document Revised: 11/21/2019 Document Reviewed: 11/21/2019 Elsevier  Patient Education  2021 [employer].

## 2021-01-27 DIAGNOSIS — E7800 Pure hypercholesterolemia, unspecified: Secondary | ICD-10-CM | POA: Diagnosis not present

## 2021-01-27 DIAGNOSIS — E1169 Type 2 diabetes mellitus with other specified complication: Secondary | ICD-10-CM | POA: Diagnosis not present

## 2021-01-27 DIAGNOSIS — M5442 Lumbago with sciatica, left side: Secondary | ICD-10-CM | POA: Diagnosis not present

## 2021-01-27 DIAGNOSIS — Z79899 Other long term (current) drug therapy: Secondary | ICD-10-CM | POA: Diagnosis not present

## 2021-01-27 DIAGNOSIS — M47819 Spondylosis without myelopathy or radiculopathy, site unspecified: Secondary | ICD-10-CM | POA: Diagnosis not present

## 2021-01-27 DIAGNOSIS — G8929 Other chronic pain: Secondary | ICD-10-CM | POA: Diagnosis not present

## 2021-01-28 ENCOUNTER — Other Ambulatory Visit: Payer: Self-pay

## 2021-01-28 ENCOUNTER — Encounter: Payer: Self-pay | Admitting: Family Medicine

## 2021-01-28 DIAGNOSIS — Z794 Long term (current) use of insulin: Secondary | ICD-10-CM

## 2021-01-28 DIAGNOSIS — E118 Type 2 diabetes mellitus with unspecified complications: Secondary | ICD-10-CM

## 2021-01-28 MED ORDER — OZEMPIC (1 MG/DOSE) 2 MG/1.5ML ~~LOC~~ SOPN: 1.0000 mg | PEN_INJECTOR | SUBCUTANEOUS | 5 refills | Status: DC

## 2021-01-30 DIAGNOSIS — M539 Dorsopathy, unspecified: Secondary | ICD-10-CM | POA: Diagnosis not present

## 2021-01-30 DIAGNOSIS — M47819 Spondylosis without myelopathy or radiculopathy, site unspecified: Secondary | ICD-10-CM | POA: Diagnosis not present

## 2021-02-17 ENCOUNTER — Ambulatory Visit: Payer: Medicare Other | Admitting: Family Medicine

## 2021-02-18 ENCOUNTER — Ambulatory Visit (INDEPENDENT_AMBULATORY_CARE_PROVIDER_SITE_OTHER): Payer: Medicare Other | Admitting: Licensed Clinical Social Worker

## 2021-02-18 ENCOUNTER — Other Ambulatory Visit: Payer: Self-pay

## 2021-02-18 DIAGNOSIS — F4310 Post-traumatic stress disorder, unspecified: Secondary | ICD-10-CM

## 2021-02-18 DIAGNOSIS — F314 Bipolar disorder, current episode depressed, severe, without psychotic features: Secondary | ICD-10-CM
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
## 2021-02-21 ENCOUNTER — Other Ambulatory Visit: Payer: Self-pay | Admitting: Family Medicine

## 2021-02-21 DIAGNOSIS — Z794 Long term (current) use of insulin: Secondary | ICD-10-CM

## 2021-02-21 DIAGNOSIS — E118 Type 2 diabetes mellitus with unspecified complications: Secondary | ICD-10-CM

## 2021-02-24 DIAGNOSIS — M5442 Lumbago with sciatica, left side: Secondary | ICD-10-CM | POA: Diagnosis not present

## 2021-02-24 DIAGNOSIS — M539 Dorsopathy, unspecified: Secondary | ICD-10-CM | POA: Diagnosis not present

## 2021-02-24 DIAGNOSIS — E11610 Type 2 diabetes mellitus with diabetic neuropathic arthropathy: Secondary | ICD-10-CM | POA: Diagnosis not present

## 2021-02-24 DIAGNOSIS — G629 Polyneuropathy, unspecified: Secondary | ICD-10-CM | POA: Diagnosis not present

## 2021-02-24 DIAGNOSIS — G8929 Other chronic pain: Secondary | ICD-10-CM | POA: Diagnosis not present

## 2021-02-24 DIAGNOSIS — E7800 Pure hypercholesterolemia, unspecified: Secondary | ICD-10-CM | POA: Diagnosis not present

## 2021-02-25 ENCOUNTER — Encounter: Payer: Self-pay | Admitting: Family Medicine

## 2021-02-25 ENCOUNTER — Telehealth: Payer: Self-pay

## 2021-02-25 DIAGNOSIS — E118 Type 2 diabetes mellitus with unspecified complications: Secondary | ICD-10-CM

## 2021-02-25 DIAGNOSIS — Z794 Long term (current) use of insulin: Secondary | ICD-10-CM

## 2021-02-25 NOTE — Chronic Care Management (AMB) (Signed)
Chronic Care Management Pharmacy Assistant   Name: Jodi Wolfe  MRN: [id number] DOB: 1971-09-10  Reason for Encounter: Disease State/ Diabetes Adherence Call  PCP : Leamon Arnt, MD  Allergies:   Allergies  Allergen Reactions  . Morphine And Related   . Sulfa Antibiotics     Medications: Outpatient Encounter Medications as of 02/25/2021  Medication Sig  . atorvastatin (LIPITOR) 10 MG tablet TAKE 1 TABLET BY MOUTH EVERY DAY  . blood glucose meter kit and supplies KIT Dispense based on patient and insurance preference. Use up to four times daily as directed. (FOR ICD-9 250.00, 250.01).  . chlorproMAZINE (THORAZINE) 50 MG tablet Take 1 tablet (50 mg total) by mouth at bedtime.  . clonazePAM (KLONOPIN) 0.5 MG tablet Take 1 tablet (0.5 mg total) by mouth 4 (four) times daily as needed for anxiety.  . DEXILANT 60 MG capsule TAKE 1 CAPSULE BY MOUTH EVERY DAY  . FARXIGA 10 MG TABS tablet TAKE 1 TABLET BY MOUTH EVERY DAY BEFORE BREAKFAST  . gabapentin (NEURONTIN) 600 MG tablet Take 1.5 tablets (900 mg total) by mouth 3 (three) times daily.  Marland Kitchen HYDROcodone-acetaminophen (NORCO) 10-325 MG tablet Take 1 tablet by mouth every 8 (eight) hours as needed for severe pain (Chronic pain).  Marland Kitchen lamoTRIgine (LAMICTAL) 150 MG tablet Take 2 tablets (300 mg total) by mouth daily.  . Lancets (ONETOUCH DELICA PLUS [id number]) MISC USE UP TO 4 TIMES A DAY AS DIRECTED  . lidocaine (LIDODERM) 5 % PLACE 1 PATCH ONTO THE SKIN DAILY. REMOVE & DISCARD PATCH WITHIN 12 HOURS OR AS DIRECTED BY MD (Patient not taking: Reported on 01/21/2021)  . naproxen (NAPROSYN) 500 MG tablet Take 0.5 tablets (250 mg total) by mouth 2 (two) times daily with a meal. (Patient not taking: No sig reported)  . NARCAN 4 MG/0.1ML LIQD nasal spray kit SMARTSIG:1 Spray(s) Both Nares Once PRN  . nitrofurantoin, macrocrystal-monohydrate, (MACROBID) 100 MG capsule Take 1 capsule (100 mg total) by mouth 2 (two) times daily.  . ondansetron  (ZOFRAN-ODT) 4 MG disintegrating tablet TAKE 1 TABLET BY MOUTH EVERY 8 HOURS AS NEEDED FOR NAUSEA AND VOMITING (Patient not taking: Reported on 01/21/2021)  . ONETOUCH ULTRA test strip USE UP TO 4 TIMES A DAY AS DIRECTED  . Semaglutide, 1 MG/DOSE, (OZEMPIC, 1 MG/DOSE,) 2 MG/1.5ML SOPN Inject 1 mg into the skin once a week.  . [DISCONTINUED] esomeprazole (NEXIUM) 40 MG capsule TAKE 1 CAPSULE BY MOUTH EVERY DAY   No facility-administered encounter medications on file as of 02/25/2021.    Current Diagnosis: Patient Active Problem List   Diagnosis Date Noted  . Chronic narcotic dependence ([place]) 01/22/2020  . High risk medication use 01/22/2020  . Perimenopausal vasomotor symptoms 10/18/2019  . Dyspepsia 02/16/2019  . Hyperlipidemia associated with type 2 diabetes mellitus ([place]) 02/16/2019  . Lumbar radiculopathy 11/21/2018  . Chronic back pain - pain management [place] 11/10/2018  . PTSD (post-traumatic stress disorder) 11/10/2018  . Bipolar depression ([place]), followed by Psychiatry 11/10/2018  . Class 3 severe obesity due to excess calories with serious comorbidity and body mass index (BMI) of 40.0 to 44.9 in adult ([place]) 04/19/2017  . GAD (generalized anxiety disorder) 04/19/2017  . Psychophysiological insomnia 04/19/2017  . Type 2 diabetes mellitus with complication, with long-term current use of insulin ([place]) 04/19/2017    Recent Relevant Labs: Lab Results  Component Value Date/Time   HGBA1C 6.1 (H) 08/12/2020 09:06 AM   HGBA1C 6.2 (A) 01/22/2020 03:28 PM   HGBA1C  6.9 (H) 10/18/2019 08:40 AM   MICROALBUR 2.2 (H) 05/29/2019 04:14 PM   MICROALBUR 2.4 (H) 04/19/2018 04:51 PM    Kidney Function Lab Results  Component Value Date/Time   CREATININE 0.73 08/12/2020 09:06 AM   CREATININE 0.74 10/18/2019 08:40 AM   CREATININE 0.64 05/29/2019 04:14 PM   GFR 83.47 10/18/2019 08:40 AM    . Current antihyperglycemic regimen:  o Farxiga 10 mg tablet daily o Ozempic $RemoveB'2mg'evjVQJPv$ /1.69mL inject 1 mg  into the skin once a week  . What recent interventions/DTPs have been made to improve glycemic control:  o No medication changes indicated. Patient states she is currently taking her medications as prescribed.  . Have there been any recent hospitalizations or ED visits since last visit with CPP? No , patient has not had any recent hospitalizations or ED visits since her last visit with Madelin Rear, CPP.  Marland Kitchen Patient denies hypoglycemic symptoms, including Pale, Sweaty, Shaky, Hungry, Nervous/irritable and Vision changes   . Patient reports hyperglycemic symptoms, including excessive thirst, fatigue and polyuria. Patient states she experiences these symptoms on a daily basis.  . How often are you checking your blood sugar? once daily . What are your blood sugars ranging?  o Fasting: 147 o Before meals: n/a o After meals: n/a o Bedtime: n/a  . During the week, how often does your blood glucose drop below 70? once every few years, very rarely  . Are you checking your feet daily/regularly? Yes, patient states she does check her feet regularly.  Adherence Review: Is the patient currently on a STATIN medication? Yes Is the patient currently on ACE/ARB medication? No Does the patient have >5 day gap between last estimated fill dates? No   Pharmacist Madelin Rear, CPP reviewed patient's cost and copays They are reduced for all pharmacies. Using [facility] would be very similar to using [facility]. Patient states she would like to think about switching to [facility] and whether or not she would like to use our pharmacy services at this time.  Discussed benefits and advantages of using [facility] with patient.  Future Appointments  Date Time Provider [place]  03/04/2021 11:00 AM Shade Flood [facility] None  03/10/2021 10:00 AM Arfeen, Arlyce Harman, MD [facility] None  03/24/2021  1:30 PM Leamon Arnt, MD [facility]  04/22/2021 10:30 AM [facility] CCM PHARMACIST [facility]  07/21/2021 10:15  AM [facility] HEALTH COACH [facility]    April D Calhoun, [place] Pharmacist Assistant [phone]   Follow-Up:  Pharmacist Review

## 2021-02-26 MED ORDER — ONDANSETRON 4 MG PO TBDP
4.0000 mg | ORAL_TABLET | Freq: Three times a day (TID) | ORAL | 0 refills | Status: DC | PRN
Start: 2021-02-26 — End: 2023-08-23

## 2021-02-27 ENCOUNTER — Other Ambulatory Visit (HOSPITAL_COMMUNITY): Payer: Self-pay | Admitting: Psychiatry

## 2021-02-27 DIAGNOSIS — M539 Dorsopathy, unspecified: Secondary | ICD-10-CM | POA: Diagnosis not present

## 2021-02-27 DIAGNOSIS — F4310 Post-traumatic stress disorder, unspecified: Secondary | ICD-10-CM

## 2021-02-27 DIAGNOSIS — F3131 Bipolar disorder, current episode depressed, mild: Secondary | ICD-10-CM

## 2021-02-27 DIAGNOSIS — F410 Panic disorder [episodic paroxysmal anxiety] without agoraphobia: Secondary | ICD-10-CM

## 2021-02-27 DIAGNOSIS — M47819 Spondylosis without myelopathy or radiculopathy, site unspecified: Secondary | ICD-10-CM | POA: Diagnosis not present

## 2021-03-04 ENCOUNTER — Telehealth (HOSPITAL_COMMUNITY): Payer: Self-pay | Admitting: *Deleted

## 2021-03-04 ENCOUNTER — Other Ambulatory Visit: Payer: Self-pay

## 2021-03-04 ENCOUNTER — Telehealth (HOSPITAL_COMMUNITY): Payer: Self-pay | Admitting: Licensed Clinical Social Worker

## 2021-03-04 ENCOUNTER — Ambulatory Visit (HOSPITAL_COMMUNITY): Payer: Medicare Other | Admitting: Licensed Clinical Social Worker

## 2021-03-04 NOTE — Telephone Encounter (Signed)
Patient called and requested refill of Klonopin.  Record checked and refill was present.  Placed call to pharmacy and discussed.  Pharmacy had refill as inactive.  Pharmacist reactivated script and is filling it for patient.

## 2021-03-04 NOTE — Telephone Encounter (Signed)
Jodi Wolfe had a virtual therapy session scheduled today at 11am.  Clinician contacted her by telephone due to her inability to access virtual meeting.  [name] reported that she would need to cancel appointment today due to a family emergency that arose, and planned to reschedule for next week.  Clinician expressed understanding, and informed front desk of this cancellation.    [name], LCSW, LCAS 03/04/21

## 2021-03-10 ENCOUNTER — Encounter (HOSPITAL_COMMUNITY): Payer: Self-pay | Admitting: Psychiatry

## 2021-03-10 ENCOUNTER — Other Ambulatory Visit: Payer: Self-pay

## 2021-03-10 ENCOUNTER — Telehealth (INDEPENDENT_AMBULATORY_CARE_PROVIDER_SITE_OTHER): Payer: Medicare Other | Admitting: Psychiatry

## 2021-03-10 DIAGNOSIS — F4310 Post-traumatic stress disorder, unspecified: Secondary | ICD-10-CM

## 2021-03-10 DIAGNOSIS — F410 Panic disorder [episodic paroxysmal anxiety] without agoraphobia: Secondary | ICD-10-CM | POA: Diagnosis not present

## 2021-03-10 DIAGNOSIS — F3131 Bipolar disorder, current episode depressed, mild: Secondary | ICD-10-CM | POA: Diagnosis not present

## 2021-03-10 MED ORDER — LAMOTRIGINE 150 MG PO TABS
300.0000 mg | ORAL_TABLET | Freq: Every day | ORAL | 0 refills | Status: DC
Start: 2021-03-10 — End: 2021-06-10

## 2021-03-10 MED ORDER — CLONAZEPAM 0.5 MG PO TABS: 0.5000 mg | ORAL_TABLET | Freq: Four times a day (QID) | ORAL | 2 refills | Status: DC | PRN

## 2021-03-10 MED ORDER — CHLORPROMAZINE HCL 25 MG PO TABS
75.0000 mg | ORAL_TABLET | Freq: Every day | ORAL | 2 refills | Status: DC
Start: 2021-03-10 — End: 2021-06-10
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
## 2021-03-18 ENCOUNTER — Other Ambulatory Visit: Payer: Self-pay

## 2021-03-18 ENCOUNTER — Ambulatory Visit (INDEPENDENT_AMBULATORY_CARE_PROVIDER_SITE_OTHER): Payer: Medicare Other | Admitting: Licensed Clinical Social Worker

## 2021-03-18 DIAGNOSIS — F314 Bipolar disorder, current episode depressed, severe, without psychotic features: Secondary | ICD-10-CM

## 2021-03-18 DIAGNOSIS — F4310 Post-traumatic stress disorder, unspecified: Secondary | ICD-10-CM

## 2021-03-18 NOTE — Progress Notes (Signed)
Virtual Visit via Telephone Note   I connected with Jodi Wolfe on 03/18/21 at 10:00am by telephone and verified that I am speaking with the correct person using two identifiers.   I discussed the limitations, risks, security and privacy concerns of performing an evaluation and management service by telephone and the availability of in person appointments. I also discussed with the patient that there may be a patient responsible charge related to this service. The patient expressed understanding and agreed to proceed.   I discussed the assessment and treatment plan with the patient. The patient was provided an opportunity to ask questions and all were answered. The patient agreed with the plan and demonstrated an understanding of the instructions.   The patient was advised to call back or seek an in-person evaluation if the symptoms worsen or if the condition fails to improve as anticipated.   I provided 1 hour of non-face-to-face time during this encounter.     [name], LCSW, LCAS ________________________________ THERAPIST PROGRESS NOTE   Session Time: 10:00am - 11:00am  Location: Patient: Patient Home Provider: [facility]    Participation Level: Active   Behavioral Response: Alert, anxious mood   Type of Therapy:  Individual Therapy   Treatment Goals addressed: Depression, panic attack, and anxiety management; Medication Compliance; Repairing relationships with family   Interventions: CBT, assertive communication skills   Summary: Jodi Wolfe is a 50 year old Caucasian female that presented for telephone appointment today with diagnoses of Bipolar Disorder, current episode depressed, severe w/out psychotic features; and PTSD.         Suicidal/Homicidal: None; without plan or intent.    Therapist Response: Clinician spoke with Jodi Wolfe for virtual session today by phone, as she remains unable to access virtual sessions.  Clinician assessed for safety, sobriety, and medication  compliance.  Jodi Wolfe spoke in a manner that was alert, oriented x5, with no evidence or self-report of active SI/HI or A/V H.  Virgia reported ongoing compliance with medication and denied any use of alcohol or illicit substances.  Beula denied experiencing any symptoms of mania.  Clinician inquired about Jodi Wolfe's current emotional ratings, as well as any significant changes in thoughts, feelings, or behavior since last check-in. Jodi Wolfe reported scores of 3/10 for depression, 7/10 for anxiety, 0/10 for anger/irritability, and experienced 4-5 panic attacks over past week.  Jodi Wolfe reported that things have been 'rough' because she has been having communication issues with her daughter in law when she helps with babysitting throughout the week, stating "Its like dealing with a warden in a jail.  I'm afraid to speak up about how critical she is of me, but its really starting to bother me".  Clinician utilized a handout on communication skills with Jodi Wolfe today to outline strategies for addressing ongoing issues with this individual in order to embody more assertive approach and reduce anxiety, including choosing a calm moment to speak with her, being mindful of body language/tone of voice, using "I" statements to express feelings, describing the problem clearly, avoiding sidetracking, and being respectful.  Intervention was effective, as evidenced by Jodi Wolfe reporting that it was helpful to discuss this issue and gain support, stating "This really opened my eyes.  I hate having to tiptoe around her and something has to change, so I'm going to stand up for myself the next time I go over there".  Clinician will continue to monitor.      Plan: Follow up again in 2 weeks virtually.   Diagnosis: Bipolar Disorder,  current episode depressed, severe w/out psychotic features; and PTSD.   [name], LCSW, LCAS 03/18/21

## 2021-03-23 ENCOUNTER — Other Ambulatory Visit: Payer: Self-pay | Admitting: Family Medicine

## 2021-03-23 DIAGNOSIS — Z794 Long term (current) use of insulin: Secondary | ICD-10-CM

## 2021-03-23 DIAGNOSIS — E118 Type 2 diabetes mellitus with unspecified complications: Secondary | ICD-10-CM

## 2021-03-24 ENCOUNTER — Ambulatory Visit: Payer: Medicare Other | Admitting: Family Medicine

## 2021-03-24 DIAGNOSIS — M47819 Spondylosis without myelopathy or radiculopathy, site unspecified: Secondary | ICD-10-CM | POA: Diagnosis not present

## 2021-03-24 DIAGNOSIS — G8929 Other chronic pain: Secondary | ICD-10-CM | POA: Diagnosis not present

## 2021-03-24 DIAGNOSIS — G629 Polyneuropathy, unspecified: Secondary | ICD-10-CM | POA: Diagnosis not present

## 2021-03-24 DIAGNOSIS — Z79899 Other long term (current) drug therapy: Secondary | ICD-10-CM | POA: Diagnosis not present

## 2021-03-24 DIAGNOSIS — M5442 Lumbago with sciatica, left side: Secondary | ICD-10-CM | POA: Diagnosis not present

## 2021-03-28 ENCOUNTER — Other Ambulatory Visit (HOSPITAL_COMMUNITY): Payer: Self-pay | Admitting: Psychiatry

## 2021-03-28 DIAGNOSIS — F3131 Bipolar disorder, current episode depressed, mild: Secondary | ICD-10-CM

## 2021-03-30 DIAGNOSIS — M47819 Spondylosis without myelopathy or radiculopathy, site unspecified: Secondary | ICD-10-CM | POA: Diagnosis not present

## 2021-03-30 DIAGNOSIS — M539 Dorsopathy, unspecified: Secondary | ICD-10-CM | POA: Diagnosis not present

## 2021-04-01 ENCOUNTER — Other Ambulatory Visit: Payer: Self-pay

## 2021-04-01 ENCOUNTER — Ambulatory Visit (INDEPENDENT_AMBULATORY_CARE_PROVIDER_SITE_OTHER): Payer: Medicare Other | Admitting: Licensed Clinical Social Worker

## 2021-04-01 DIAGNOSIS — F314 Bipolar disorder, current episode depressed, severe, without psychotic features: Secondary | ICD-10-CM

## 2021-04-01 DIAGNOSIS — F4310 Post-traumatic stress disorder, unspecified: Secondary | ICD-10-CM
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
## 2021-04-07 ENCOUNTER — Other Ambulatory Visit (HOSPITAL_COMMUNITY): Payer: Self-pay | Admitting: Psychiatry

## 2021-04-07 DIAGNOSIS — F3131 Bipolar disorder, current episode depressed, mild: Secondary | ICD-10-CM

## 2021-04-11 ENCOUNTER — Other Ambulatory Visit: Payer: Self-pay | Admitting: Family Medicine

## 2021-04-11 DIAGNOSIS — Z794 Long term (current) use of insulin: Secondary | ICD-10-CM

## 2021-04-11 DIAGNOSIS — E118 Type 2 diabetes mellitus with unspecified complications: Secondary | ICD-10-CM

## 2021-04-14 NOTE — Telephone Encounter (Signed)
Schedule an appointment for further refills. 

## 2021-04-15 ENCOUNTER — Other Ambulatory Visit: Payer: Self-pay

## 2021-04-15 ENCOUNTER — Ambulatory Visit (INDEPENDENT_AMBULATORY_CARE_PROVIDER_SITE_OTHER): Payer: Medicare Other | Admitting: Licensed Clinical Social Worker

## 2021-04-15 DIAGNOSIS — F4310 Post-traumatic stress disorder, unspecified: Secondary | ICD-10-CM

## 2021-04-15 DIAGNOSIS — F314 Bipolar disorder, current episode depressed, severe, without psychotic features: Secondary | ICD-10-CM | POA: Diagnosis not present
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
## 2021-04-21 DIAGNOSIS — M5442 Lumbago with sciatica, left side: Secondary | ICD-10-CM | POA: Diagnosis not present

## 2021-04-21 DIAGNOSIS — E1165 Type 2 diabetes mellitus with hyperglycemia: Secondary | ICD-10-CM | POA: Diagnosis not present

## 2021-04-21 DIAGNOSIS — M47819 Spondylosis without myelopathy or radiculopathy, site unspecified: Secondary | ICD-10-CM | POA: Diagnosis not present

## 2021-04-21 DIAGNOSIS — E7800 Pure hypercholesterolemia, unspecified: Secondary | ICD-10-CM | POA: Diagnosis not present

## 2021-04-21 DIAGNOSIS — E559 Vitamin D deficiency, unspecified: Secondary | ICD-10-CM | POA: Diagnosis not present

## 2021-04-21 DIAGNOSIS — G8929 Other chronic pain: Secondary | ICD-10-CM | POA: Diagnosis not present

## 2021-04-21 DIAGNOSIS — Z79899 Other long term (current) drug therapy: Secondary | ICD-10-CM | POA: Diagnosis not present

## 2021-04-21 DIAGNOSIS — G629 Polyneuropathy, unspecified: Secondary | ICD-10-CM | POA: Diagnosis not present

## 2021-04-22 ENCOUNTER — Ambulatory Visit: Payer: Medicare Other

## 2021-04-22 NOTE — Progress Notes (Deleted)
Chronic Care Management Pharmacy Note  04/22/2021 Name:  Jodi Wolfe MRN:  [id number] DOB:  1971/04/14  Subjective: Jodi Wolfe is an 50 y.o. year old female who is a primary patient of Leamon Arnt, MD.  The CCM team was consulted for assistance with disease management and care coordination needs.    {CCMTELEPHONEFACETOFACE:21091510} for {CCMINITIALFOLLOWUPCHOICE:21091511} in response to provider referral for pharmacy case management and/or care coordination services.   Consent to Services:  {CCMCONSENTOPTIONS:25074}  Patient Care Team: Leamon Arnt, MD as PCP - General (Family Medicine) Kathlee Nations, MD as Consulting Physician (Psychiatry) Magnus Sinning, MD as Consulting Physician (Physical Medicine and Rehabilitation) Beverley Fiedler, [place] (Pain Medicine) Madelin Rear, [facility] (Pharmacist) Madelin Rear, [facility] as Pharmacist (Pharmacist)  Recent office visits: ***  Recent consult visits: [facility] visits: {Hospital DC Yes/No:21091515}  Objective:  Lab Results  Component Value Date   CREATININE 0.73 08/12/2020   CREATININE 0.74 10/18/2019   CREATININE 0.64 05/29/2019    Lab Results  Component Value Date   HGBA1C 6.1 (H) 08/12/2020   Last diabetic Eye exam:  Lab Results  Component Value Date/Time   HMDIABEYEEXA No Retinopathy 07/05/2017 12:00 AM    Last diabetic Foot exam: No results found for: HMDIABFOOTEX      Component Value Date/Time   CHOL 146 08/12/2020 0906   TRIG 119 08/12/2020 0906   HDL 51 08/12/2020 0906   CHOLHDL 2.9 08/12/2020 0906   VLDL 31.0 05/29/2019 1614   LDLCALC 74 08/12/2020 0906    Hepatic Function Latest Ref Rng & Units 08/12/2020 10/18/2019 05/29/2019  Total Protein 6.1 - 8.1 g/dL 7.3 7.9 7.6  Albumin 3.5 - 5.2 g/dL - 4.8 4.4  AST 10 - 35 U/L $Remo'10 14 14  'LHzBK$ ALT 6 - 29 U/L 10 14 40(H)  Alk Phosphatase 39 - 117 U/L - 112 135(H)  Total Bilirubin 0.2 - 1.2 mg/dL 0.5 0.5 0.3    Lab Results  Component Value Date/Time    TSH 1.17 04/19/2017 10:58 AM    CBC Latest Ref Rng & Units 10/18/2019 05/29/2019 02/13/2019  WBC 4.0 - 10.5 K/uL 6.8 8.2 7.5  Hemoglobin 12.0 - 15.0 g/dL 12.9 12.5 12.7  Hematocrit 36.0 - 46.0 % 38.8 37.3 39.0  Platelets 150.0 - 400.0 K/uL 233.0 200.0 210.0    No results found for: VD25OH  Clinical ASCVD: {YES/NO:21197} The ASCVD Risk score (Florence., et al., 2013) failed to calculate for the following reasons:   The systolic blood pressure is missing    Other: (CHADS2VASc if Afib, PHQ9 if depression, MMRC or CAT for COPD, ACT, DEXA)  Social History   Tobacco Use  Smoking Status Never Smoker  Smokeless Tobacco Never Used   BP Readings from Last 3 Encounters:  01/22/20 124/78  10/18/19 110/74  06/05/19 115/76   Pulse Readings from Last 3 Encounters:  01/22/20 84  10/18/19 94  06/05/19 81   Wt Readings from Last 3 Encounters:  03/10/21 219 lb (99.3 kg)  12/09/20 220 lb (99.8 kg)  09/16/20 211 lb (95.7 kg)    Assessment: Review of patient past medical history, allergies, medications, health status, including review of consultants reports, laboratory and other test data, was performed as part of comprehensive evaluation and provision of chronic care management services.   SDOH:  (Social Determinants of Health) assessments and interventions performed:    CCM Care Plan  Allergies  Allergen Reactions  . Morphine And Related   . Sulfa Antibiotics  Medications Reviewed Today    Reviewed by Kathlee Nations, MD (Psychiatrist) on 03/10/21 at 1009  Med List Status: <None>  Medication Order Taking? Sig Documenting Provider Last Dose Status Informant  atorvastatin (LIPITOR) 10 MG tablet [id number] No TAKE 1 TABLET BY MOUTH EVERY DAY Leamon Arnt, MD Taking Active   blood glucose meter kit and supplies KIT [id number] No Dispense based on patient and insurance preference. Use up to four times daily as directed. (FOR ICD-9 250.00, 250.01). Leamon Arnt, MD Taking Active    chlorproMAZINE (THORAZINE) 50 MG tablet [id number] No Take 1 tablet (50 mg total) by mouth at bedtime. Kathlee Nations, MD Taking Active   clonazePAM (KLONOPIN) 0.5 MG tablet [id number] No Take 1 tablet (0.5 mg total) by mouth 4 (four) times daily as needed for anxiety. Kathlee Nations, MD Taking Active   DEXILANT 60 MG capsule [id number] No TAKE 1 CAPSULE BY MOUTH EVERY DAY Leamon Arnt, MD Taking Active         Discontinued 02/13/19 1617   FARXIGA 10 MG TABS tablet [id number]  TAKE 1 TABLET BY MOUTH EVERY DAY BEFORE BREAKFAST Leamon Arnt, MD  Active   gabapentin (NEURONTIN) 600 MG tablet [id number] No Take 1.5 tablets (900 mg total) by mouth 3 (three) times daily. Leamon Arnt, MD Taking Active   HYDROcodone-acetaminophen [facility]) 10-325 MG tablet [id number] No Take 1 tablet by mouth every 8 (eight) hours as needed for severe pain (Chronic pain). Leamon Arnt, MD Taking Active   lamoTRIgine (LAMICTAL) 150 MG tablet [id number] No Take 2 tablets (300 mg total) by mouth daily. Kathlee Nations, MD Taking Active   Lancets [facility] Donaciano Eva PLUS [id number]) [place] [id number]  USE UP TO 4 TIMES A DAY AS DIRECTED Leamon Arnt, MD  Active   lidocaine (LIDODERM) 5 % [id number] No PLACE 1 PATCH ONTO THE SKIN DAILY. REMOVE & DISCARD PATCH WITHIN 12 HOURS OR AS DIRECTED BY MD  Patient not taking: Reported on 01/21/2021   Leamon Arnt, MD Not Taking Active   naproxen (NAPROSYN) 500 MG tablet [id number] No Take 0.5 tablets (250 mg total) by mouth 2 (two) times daily with a meal.  Patient not taking: No sig reported   Briscoe Deutscher, DO Not Taking Active   NARCAN 4 MG/0.1ML LIQD nasal spray kit [id number] No SMARTSIG:1 Spray(s) Both Nares Once PRN [provider] Taking Active   nitrofurantoin, macrocrystal-monohydrate, (MACROBID) 100 MG capsule [id number]  Take 1 capsule (100 mg total) by mouth 2 (two) times daily. Leamon Arnt, MD  Active   ondansetron (ZOFRAN-ODT) 4 MG disintegrating tablet  [id number]  Take 1 tablet (4 mg total) by mouth every 8 (eight) hours as needed for nausea or vomiting. Leamon Arnt, MD  Active   [facility] ULTRA test strip [id number] No USE UP TO 4 TIMES A DAY AS DIRECTED Leamon Arnt, MD Taking Active   Semaglutide, 1 MG/DOSE, (OZEMPIC, 1 MG/DOSE,) 2 MG/1.5ML SOPN [id number]  Inject 1 mg into the skin once a week. Leamon Arnt, MD  Active           Patient Active Problem List   Diagnosis Date Noted  . Chronic narcotic dependence ([place]) 01/22/2020  . High risk medication use 01/22/2020  . Perimenopausal vasomotor symptoms 10/18/2019  . Dyspepsia 02/16/2019  . Hyperlipidemia associated with type 2 diabetes mellitus ([place]) 02/16/2019  . Lumbar radiculopathy 11/21/2018  . Chronic back pain - pain management [place] 11/10/2018  . PTSD (  post-traumatic stress disorder) 11/10/2018  . Bipolar depression ([place]), followed by Psychiatry 11/10/2018  . Class 3 severe obesity due to excess calories with serious comorbidity and body mass index (BMI) of 40.0 to 44.9 in adult ([place]) 04/19/2017  . GAD (generalized anxiety disorder) 04/19/2017  . Psychophysiological insomnia 04/19/2017  . Type 2 diabetes mellitus with complication, with long-term current use of insulin ([place]) 04/19/2017    Immunization History  Administered Date(s) Administered  . Influenza,inj,Quad PF,6+ Mos 11/29/2017, 11/09/2018  . Pneumococcal Polysaccharide-23 04/19/2018  . Tdap 11/27/2016    Conditions to be addressed/monitored: {CCM ASSESSMENT DISEASE OPTIONS:25047}  There are no care plans that you recently modified to display for this patient.   Medication Assistance: {MEDASSISTANCEINFO:25044}  Patient's preferred pharmacy is:  [id number] [address], [place] [address] Phone: [phone] Fax: [phone]  Uses pill box? {Yes or If no, why not?:20788} Pt endorses ***% compliance  Follow Up:  {FOLLOWUP:24991}  Plan: {CM FOLLOW  UP PLAN:25073}  SIG***

## 2021-04-23 ENCOUNTER — Telehealth: Payer: Self-pay

## 2021-04-23 NOTE — Chronic Care Management (AMB) (Signed)
Chronic Care Management Pharmacy Assistant   Name: Jodi Wolfe  MRN: [id number] DOB: 1971-05-22  Reason for Encounter: General Adherence/ CPP Reschedule Call   Recent office visits:  No visits noted  Recent consult visits:  No visits noted  Hospital visits:  None in previous 6 months  Medications: Outpatient Encounter Medications as of 04/23/2021  Medication Sig  . atorvastatin (LIPITOR) 10 MG tablet Take 1 tablet (10 mg total) by mouth daily.  . blood glucose meter kit and supplies KIT Dispense based on patient and insurance preference. Use up to four times daily as directed. (FOR ICD-9 250.00, 250.01).  . chlorproMAZINE (THORAZINE) 25 MG tablet Take 3 tablets (75 mg total) by mouth at bedtime.  . clonazePAM (KLONOPIN) 0.5 MG tablet Take 1 tablet (0.5 mg total) by mouth 4 (four) times daily as needed for anxiety.  . DEXILANT 60 MG capsule TAKE 1 CAPSULE BY MOUTH EVERY DAY  . FARXIGA 10 MG TABS tablet TAKE 1 TABLET BY MOUTH EVERY DAY BEFORE BREAKFAST  . gabapentin (NEURONTIN) 600 MG tablet Take 1.5 tablets (900 mg total) by mouth 3 (three) times daily.  Marland Kitchen HYDROcodone-acetaminophen (NORCO) 10-325 MG tablet Take 1 tablet by mouth every 8 (eight) hours as needed for severe pain (Chronic pain).  Marland Kitchen lamoTRIgine (LAMICTAL) 150 MG tablet Take 2 tablets (300 mg total) by mouth daily.  . Lancets (ONETOUCH DELICA PLUS LANCET33G) MISC USE UP TO 4 TIMES A DAY AS DIRECTED  . lidocaine (LIDODERM) 5 % PLACE 1 PATCH ONTO THE SKIN DAILY. REMOVE & DISCARD PATCH WITHIN 12 HOURS OR AS DIRECTED BY MD (Patient not taking: Reported on 01/21/2021)  . naproxen (NAPROSYN) 500 MG tablet Take 0.5 tablets (250 mg total) by mouth 2 (two) times daily with a meal. (Patient not taking: No sig reported)  . NARCAN 4 MG/0.1ML LIQD nasal spray kit SMARTSIG:1 Spray(s) Both Nares Once PRN  . nitrofurantoin, macrocrystal-monohydrate, (MACROBID) 100 MG capsule Take 1 capsule (100 mg total) by mouth 2 (two) times daily.  .  ondansetron (ZOFRAN-ODT) 4 MG disintegrating tablet Take 1 tablet (4 mg total) by mouth every 8 (eight) hours as needed for nausea or vomiting.  Letta Pate ULTRA test strip USE UP TO 4 TIMES A DAY AS DIRECTED  . Semaglutide, 1 MG/DOSE, (OZEMPIC, 1 MG/DOSE,) 2 MG/1.5ML SOPN Inject 1 mg into the skin once a week.  . [DISCONTINUED] esomeprazole (NEXIUM) 40 MG capsule TAKE 1 CAPSULE BY MOUTH EVERY DAY   No facility-administered encounter medications on file as of 04/23/2021.   Spoke with Jodi Wolfe and she has been doing well. Since her last CPP visit she was seen by her pain management provider 04/21/21 who increased her atorvastatin from  10 mg to 40 mg and her gabapentin from 600 mg to 900 mg. She also saw Dr. Florentina Jenny at behavioral medicine who increased her thorazine from 25 mg to 75 mg. She denies any odd side effects from these increases but does note she is having headaches which she treats with asprin. Jodi Wolfe is planning to visit a cardiologist next month as there is a family hx of heart disease and she is being cautious. She stated that with chronic anxiety and panic attacks it is a good plan of action. At her last visit with pain management labs were performed. Jodi Wolfe stated that her A1C went from a previous 6.1 to now 7.1. She was encouraged to cut sodas and sweets in order to help lower her A1C. She has begun  to do so and is trying to lose weight as well. She informed me as she is in her menopausal phase it is getting a bit more difficult for her to lose what she would like. She is encouraged to follow all of the plans set forth by her provider and is aware she can  Call for any support she may need. She has no complaints [facility] or about anything otherwise.  Star Rating Drugs: Atorvastatin 10 mg- 90 DS last filled 04/14/21  Wilford Sports CPA, CMA

## 2021-04-28 ENCOUNTER — Ambulatory Visit: Payer: Medicare Other | Admitting: Family Medicine

## 2021-04-29 ENCOUNTER — Ambulatory Visit (INDEPENDENT_AMBULATORY_CARE_PROVIDER_SITE_OTHER): Payer: Medicare Other | Admitting: Licensed Clinical Social Worker

## 2021-04-29 ENCOUNTER — Other Ambulatory Visit: Payer: Self-pay

## 2021-04-29 DIAGNOSIS — F314 Bipolar disorder, current episode depressed, severe, without psychotic features: Secondary | ICD-10-CM | POA: Diagnosis not present

## 2021-04-29 DIAGNOSIS — F4310 Post-traumatic stress disorder, unspecified: Secondary | ICD-10-CM

## 2021-04-29 DIAGNOSIS — M47819 Spondylosis without myelopathy or radiculopathy, site unspecified: Secondary | ICD-10-CM | POA: Diagnosis not present

## 2021-04-29 DIAGNOSIS — M539 Dorsopathy, unspecified: Secondary | ICD-10-CM | POA: Diagnosis not present

## 2021-04-29 NOTE — Progress Notes (Signed)
Virtual Visit via Telephone Note   I connected with [name] on 04/29/21 at 11:00am by telephone and verified that I am speaking with the correct person using two identifiers.   I discussed the limitations, risks, security and privacy concerns of performing an evaluation and management service by telephone and the availability of in person appointments. I also discussed with the patient that there may be a patient responsible charge related to this service. The patient expressed understanding and agreed to proceed.   I discussed the assessment and treatment plan with the patient. The patient was provided an opportunity to ask questions and all were answered. The patient agreed with the plan and demonstrated an understanding of the instructions.   The patient was advised to call back or seek an in-person evaluation if the symptoms worsen or if the condition fails to improve as anticipated.   I provided 1 hour of non-face-to-face time during this encounter.     Jodi Stain, LCSW, LCAS ________________________________ THERAPIST PROGRESS NOTE   Session Time: 11:00am - 12:00pm  Location: Patient: Patient Home Provider: [facility] Office    Participation Level: Active   Behavioral Response: Alert, anxious mood   Type of Therapy:  Individual Therapy   Treatment Goals addressed: Depression, panic attack, and anxiety management; Medication Compliance; Repairing relationships with family   Interventions: CBT; problem solving, communication skills   Summary: Jodi Wolfe is a 50 year old Caucasian female that presented for therapy appointment today with diagnoses of Bipolar Disorder, current episode depressed, severe w/out psychotic features; and PTSD.         Suicidal/Homicidal: None; without plan or intent.    Therapist Response: Clinician spoke with [name] for virtual appointment today by phone, as she remains unable to access virtual session.  Clinician assessed for safety, sobriety, and  medication compliance.  [name] spoke in a manner that was alert, oriented x5, with no evidence or self-report of active SI/HI or A/V H.  [name] reported that she continues taking medication responsibly and denied any use of alcohol or illicit substances.  Lynnelle denied experiencing any symptoms of mania.  Clinician inquired about [name]'s emotional ratings today, as well as any significant changes in thoughts, feelings, or behavior since previous check-in. [name] reported scores of 2/10 for depression, 6/10 for anxiety, 2/10 for irritability, and noted that she is having panic attacks 2-3 times per day at present.  [name] reported that she did end up visiting her other son's family over the past weekend, stating "It didn't go as planned.  I was on edge, so nervous, and then nothing ended up happening".  [name] reported that she focused her attention on caring for her granddaughter, and no one addressed the lack of communication over recent months. [name] reported that when they were taking her back home, she did ask to be unblocked on social media, but was given the runaround by her son for a second time.  Clinician assisted [name] in exploring potential consequences of maintaining passive approach to ongoing communication and boundary issues within her family. Clinician inquired about how [name] would approach this situation differently next time in order to properly express her feelings and assert needs regarding repeated mistreatment by family members.  [name] reported that the primary consequence of maintaining this pattern is that nothing will improve for her, and her stress level will continue to rise, which is causing skin breakouts and increased panic episodes.  [name] stated "I would have said 'okay, can we sit a couple of  hours and have a talk.  I would like to know where things went wrong so I can avoid being cut off again'".  [name] reported that additional consequence has been 'snappiness' towards minor stressors.   Clinician explored self-care activities with [name] she could integrate into routine in order to provide healthy outlet for these difficult feelings.  [name] reported that she would make effort to pray daily, and practice gratitude so she isn't too focused on negative aspects of her life.  Interventions were effective, as evidenced by [name] stating "It was definitely helpful to get all of this off my chest and get some feedback.  Sometimes I feel like this is all in my head, so its nice to have someone to check in with".  [name] also reported increased motivation towards opening up to family members, stating "I think I can do it without being hateful, and still be respectful".  Clinician will continue to monitor.     Plan: Follow up again in 2 weeks virtually.   Diagnosis: Bipolar Disorder, current episode depressed, severe w/out psychotic features; and PTSD.   Jodi Stain, LCSW, LCAS 04/29/21

## 2021-05-13 ENCOUNTER — Other Ambulatory Visit: Payer: Self-pay

## 2021-05-13 ENCOUNTER — Ambulatory Visit (INDEPENDENT_AMBULATORY_CARE_PROVIDER_SITE_OTHER): Payer: Medicare Other | Admitting: Licensed Clinical Social Worker

## 2021-05-13 DIAGNOSIS — F4310 Post-traumatic stress disorder, unspecified: Secondary | ICD-10-CM

## 2021-05-13 DIAGNOSIS — F314 Bipolar disorder, current episode depressed, severe, without psychotic features: Secondary | ICD-10-CM

## 2021-05-13 NOTE — Progress Notes (Signed)
Virtual Visit via Telephone Note   I connected with [name] on 05/13/21 at 10:00am by telephone and verified that I am speaking with the correct person using two identifiers.   I discussed the limitations, risks, security and privacy concerns of performing an evaluation and management service by telephone and the availability of in person appointments. I also discussed with the patient that there may be a patient responsible charge related to this service. The patient expressed understanding and agreed to proceed.   I discussed the assessment and treatment plan with the patient. The patient was provided an opportunity to ask questions and all were answered. The patient agreed with the plan and demonstrated an understanding of the instructions.   The patient was advised to call back or seek an in-person evaluation if the symptoms worsen or if the condition fails to improve as anticipated.   I provided 1 hour of non-face-to-face time during this encounter.     [name], LCSW, LCAS ________________________________ THERAPIST PROGRESS NOTE   Session Time: 10:00am - 11:00am   Location: Patient: Patient Home Provider: [facility]    Participation Level: Active   Behavioral Response: Alert, anxious mood   Type of Therapy:  Individual Therapy   Treatment Goals addressed: Depression, panic attack, and anxiety management; Medication Compliance; Improving mental and physical wellness    Interventions: CBT; solution focused    Summary: Jodi Wolfe is a 50 year old Caucasian female that presented for therapy appointment today with diagnoses of Bipolar Disorder, current episode depressed, severe w/out psychotic features; and PTSD.         Suicidal/Homicidal: None; without plan or intent.    Therapist Response: Clinician spoke with Jodi Wolfe for therapy session today by phone, as she remains unable to access virtual appointments.  Clinician assessed for safety, sobriety, and medication  compliance.  Jodi Wolfe spoke in a manner that was alert, oriented x5, with no evidence or self-report of active SI/HI or A/V H.  Laken reported ongoing compliance with medication and denied any use of alcohol or illicit substances.  Jodi Wolfe denied experiencing any symptoms of mania.  Clinician inquired about Jodi Wolfe's current emotional ratings, as well as any significant changes in thoughts, feelings, or behavior since last check-in. Jodi Wolfe reported scores of 2/10 for depression, 8/10 for anxiety, 1/10 for irritability, and noted that she has had 4 panic attacks over past 2 weeks.  She reported that her daughter visited for mother's day and scolded her for not taking better care of physical health, acknowledging that her A1C levels have been higher lately. Jodi Wolfe stated "I want to wean myself off sugar, but I don't have good self-control.  I'm weak".  Clinician assisted Jodi Wolfe with this concern by presenting a handout to her offering strategies for building new, healthier habits.  This included starting with small changes, updating the environment at home to encourage a healthier lifestyle, connecting new habits to other activities for positive reinforcement (I.e. 'Watching a favorite show after eating a nutritious meal'), informing support system of goals for accountability/motivation, tracking habits through daily dietary journal, and scheduling appointments with medical professionals for regular guidance/monitoring.  Jodi Wolfe was receptive to these suggestions, reporting that she would plan to begin making small changes by reducing soda from x4 cans per day to x3 with more water intake, bring more fresh fruit and vegetables into the household while limiting exposure to sweets that could trigger her, and talking to her son she lives with about assisting her with this goal, stating "  I need an accountability partner".  Intervention was effective, as evidenced by Jodi Wolfe reporting increased motivation towards improving her health  following this discussion, stating "I know that I can do this, because I did it a few years back and lost 76lbs.  I will think about asking my doctor for a referral to a nutritionist, too".  Clinician will continue to monitor.      Plan: Follow up again in 2 weeks virtually.   Diagnosis: Bipolar Disorder, current episode depressed, severe w/out psychotic features; and PTSD.   [name], LCSW, LCAS 05/13/21

## 2021-05-14 ENCOUNTER — Encounter: Payer: Self-pay | Admitting: Family Medicine

## 2021-05-19 DIAGNOSIS — R030 Elevated blood-pressure reading, without diagnosis of hypertension: Secondary | ICD-10-CM | POA: Diagnosis not present

## 2021-05-19 DIAGNOSIS — M5442 Lumbago with sciatica, left side: Secondary | ICD-10-CM | POA: Diagnosis not present

## 2021-05-19 DIAGNOSIS — G8929 Other chronic pain: Secondary | ICD-10-CM | POA: Diagnosis not present

## 2021-05-19 DIAGNOSIS — M47819 Spondylosis without myelopathy or radiculopathy, site unspecified: Secondary | ICD-10-CM | POA: Diagnosis not present

## 2021-05-19 DIAGNOSIS — M539 Dorsopathy, unspecified: Secondary | ICD-10-CM | POA: Diagnosis not present

## 2021-05-19 DIAGNOSIS — Z79899 Other long term (current) drug therapy: Secondary | ICD-10-CM | POA: Diagnosis not present

## 2021-05-27 ENCOUNTER — Other Ambulatory Visit: Payer: Self-pay

## 2021-05-27 ENCOUNTER — Ambulatory Visit (INDEPENDENT_AMBULATORY_CARE_PROVIDER_SITE_OTHER): Payer: Medicare Other | Admitting: Licensed Clinical Social Worker

## 2021-05-27 DIAGNOSIS — F4310 Post-traumatic stress disorder, unspecified: Secondary | ICD-10-CM | POA: Diagnosis not present

## 2021-05-27 DIAGNOSIS — F314 Bipolar disorder, current episode depressed, severe, without psychotic features: Secondary | ICD-10-CM

## 2021-05-27 NOTE — Progress Notes (Signed)
Virtual Visit via Telephone Note   I connected with Jodi Wolfe on 05/27/21 at 11:00am by telephone and verified that I am speaking with the correct person using two identifiers.   I discussed the limitations, risks, security and privacy concerns of performing an evaluation and management service by telephone and the availability of in person appointments. I also discussed with the patient that there may be a patient responsible charge related to this service. The patient expressed understanding and agreed to proceed.   I discussed the assessment and treatment plan with the patient. The patient was provided an opportunity to ask questions and all were answered. The patient agreed with the plan and demonstrated an understanding of the instructions.   The patient was advised to call back or seek an in-person evaluation if the symptoms worsen or if the condition fails to improve as anticipated.   I provided 45 minutes of non-face-to-face time during this encounter.     [name], LCSW, LCAS ________________________________ THERAPIST PROGRESS NOTE   Session Time: 11:00am - 11:45am   Location: Patient: Patient Home Provider: [facility]    Participation Level: Active   Behavioral Response: Alert, anxious mood   Type of Therapy:  Individual Therapy   Treatment Goals addressed: Depression, panic attack, and anxiety management; Medication Compliance   Interventions: CBT, treatment planning     Summary: Jodi Wolfe is a 50 year old Caucasian female that presented for therapy appointment today with diagnoses of Bipolar Disorder, current episode depressed, severe w/out psychotic features; and PTSD.         Suicidal/Homicidal: None; without plan or intent.    Therapist Response: Clinician spoke with Jodi Wolfe for therapy appointment today by phone, as she remains unable to access virtual sessions.  Clinician assessed for safety, sobriety, and medication compliance.  Jodi Wolfe spoke in a manner  that was alert, oriented x5, with no evidence or self-report of active SI/HI or A/V H.  Jodi Wolfe reported that she continues taking medication as prescribed and denied any use of alcohol or illicit substances.  Jodi Wolfe denied experiencing any symptoms of mania.  Clinician inquired about Jodi Wolfe's emotional ratings, as well as any significant changes in thoughts, feelings, or behavior since previous check-in. Jodi Wolfe reported scores of 2/10 for depression, 7/10 for anxiety, 2/10 for irritability, and noted that she has had 4 panic attacks over last week.  Jodi Wolfe reported that her schedule has changed recently with less need for her to take care of one son's children, stating "I have more time for myself, but I worry about the grandkids and miss them a lot".  She reported that this has led to an abundance of idle time, and a desire to sleep more often to fill the void.  Clinician recommended revisiting treatment plan today with Jodi Wolfe to make updates based upon length of time that has passed since annual assessment.  Jodi Wolfe was agreeable to this, and collaborated with clinician to make updates as follows with her verbal consent:  Meet with clinician virtually once every 2 weeks for therapy to address progress towards goals and any barriers to success;  Meet with psychiatrist once every 3 months to address efficacy of medication and make adjustments as needed to regimen and/or dosage; Take medications daily as prescribed to reduce symptoms and improve overall daily functioning; Reduce depression from average severity level of 2/10 down to a 1/10 in the next 90 days by spending 30 minutes per day towards healthy self-care activities; Reduce anxiety from average severity level  of 7/10 down to a 5/10 in next 90 days by utilizing relaxation techniques learned from therapy 2-3 times per day; Continue to process loss of both aunts by facetiming with supportive family members x3 times per week, allowing time to cry when emotions swell  up, and reading bible/praying for 30 minutes daily; Repair and strengthen relationships with children by making self available each day to assist them and be more present, show increased effort in outreaching them at least once per day, and show willingness to discuss and process their sense of abandonment;  Maintain spiritual/community support and increase overall socialization by attending church service each Sunday with friends;  Reduce panic attacks from x4-5 per week on average down to x2 within next 90 days by practicing grounding skills at least x2 per day, in addition to reflecting upon triggers afterward that could be influencing these episodes;  Improve both mental and physical wellness by checking in with PCP once every 3 months, walk x3 per week, 20 minutes at a time, and following diabetic healthy diet recommended daily; Acquire driver's license by Summer 2023 to aid in reducing reliance on children to get around, as well as open up new opportunities for hobbies/self-care activities to include in daily routine; Improve sleep hygiene by identifying 2-3 effective techniques within next 60 days that can assist with goal of 8 hours uninterrupted sleep nightly; Consider accepting referral for [name] approved therapist for treatment of PTSD symptoms if they continue to negatively impact daily functioning over next 90 days.  Progress is evidenced by Jodi Wolfe reporting reduced severity of depression and anxiety over recent months due to ongoing engagement in therapy. She also reported greater motivation towards self-care following recent change in schedule, stating "I could read more often, visit a friend, go to church, and need to start keeping a journal because I make excuses not to".  She also reported more interest in getting regular exercise and following diet more closely.  Clinician will continue to monitor.      Plan: Follow up again in 2 weeks virtually.   Diagnosis: Bipolar Disorder, current episode  depressed, severe w/out psychotic features; and PTSD.   [name], LCSW, LCAS 05/27/21

## 2021-05-30 DIAGNOSIS — M47819 Spondylosis without myelopathy or radiculopathy, site unspecified: Secondary | ICD-10-CM | POA: Diagnosis not present

## 2021-05-30 DIAGNOSIS — M539 Dorsopathy, unspecified: Secondary | ICD-10-CM | POA: Diagnosis not present

## 2021-06-10 ENCOUNTER — Encounter (HOSPITAL_COMMUNITY): Payer: Self-pay | Admitting: Psychiatry

## 2021-06-10 ENCOUNTER — Other Ambulatory Visit (HOSPITAL_COMMUNITY): Payer: Self-pay | Admitting: Psychiatry

## 2021-06-10 ENCOUNTER — Telehealth (INDEPENDENT_AMBULATORY_CARE_PROVIDER_SITE_OTHER): Payer: Medicare Other | Admitting: Psychiatry

## 2021-06-10 ENCOUNTER — Other Ambulatory Visit: Payer: Self-pay

## 2021-06-10 DIAGNOSIS — F410 Panic disorder [episodic paroxysmal anxiety] without agoraphobia: Secondary | ICD-10-CM | POA: Diagnosis not present

## 2021-06-10 DIAGNOSIS — F3131 Bipolar disorder, current episode depressed, mild: Secondary | ICD-10-CM

## 2021-06-10 DIAGNOSIS — F4310 Post-traumatic stress disorder, unspecified: Secondary | ICD-10-CM

## 2021-06-10 MED ORDER — CHLORPROMAZINE HCL 25 MG PO TABS: 75.0000 mg | ORAL_TABLET | Freq: Every day | ORAL | 2 refills | Status: DC

## 2021-06-10 MED ORDER — CLONAZEPAM 0.5 MG PO TABS
0.5000 mg | ORAL_TABLET | Freq: Four times a day (QID) | ORAL | 2 refills | Status: DC | PRN
Start: 2021-06-10 — End: 2021-08-19

## 2021-06-10 MED ORDER — LAMOTRIGINE 150 MG PO TABS: 300.0000 mg | ORAL_TABLET | Freq: Every day | ORAL | 0 refills | Status: DC
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
## 2021-06-11 ENCOUNTER — Telehealth: Payer: Self-pay

## 2021-06-11 NOTE — Chronic Care Management (AMB) (Signed)
Chronic Care Management Pharmacy Assistant   Name: Jodi Wolfe  MRN: [id number] DOB: 1971/08/15  Reason for Encounter: General Adherence / CPP Visit Reschedule Call  Recent office visits:  No visits noted  Recent consult visits:  No visits noted  Hospital visits:  None in previous 6 months  Medications: Outpatient Encounter Medications as of 06/11/2021  Medication Sig   atorvastatin (LIPITOR) 10 MG tablet Take 1 tablet (10 mg total) by mouth daily.   blood glucose meter kit and supplies KIT Dispense based on patient and insurance preference. Use up to four times daily as directed. (FOR ICD-9 250.00, 250.01).   chlorproMAZINE (THORAZINE) 25 MG tablet Take 3 tablets (75 mg total) by mouth at bedtime.   clonazePAM (KLONOPIN) 0.5 MG tablet Take 1 tablet (0.5 mg total) by mouth 4 (four) times daily as needed for anxiety.   DEXILANT 60 MG capsule TAKE 1 CAPSULE BY MOUTH EVERY DAY   FARXIGA 10 MG TABS tablet TAKE 1 TABLET BY MOUTH EVERY DAY BEFORE BREAKFAST   gabapentin (NEURONTIN) 600 MG tablet Take 1.5 tablets (900 mg total) by mouth 3 (three) times daily.   HYDROcodone-acetaminophen (NORCO) 10-325 MG tablet Take 1 tablet by mouth every 8 (eight) hours as needed for severe pain (Chronic pain).   lamoTRIgine (LAMICTAL) 150 MG tablet Take 2 tablets (300 mg total) by mouth daily.   Lancets (ONETOUCH DELICA PLUS [id number]) MISC USE UP TO 4 TIMES A DAY AS DIRECTED   lidocaine (LIDODERM) 5 % PLACE 1 PATCH ONTO THE SKIN DAILY. REMOVE & DISCARD PATCH WITHIN 12 HOURS OR AS DIRECTED BY MD (Patient not taking: Reported on 01/21/2021)   naproxen (NAPROSYN) 500 MG tablet Take 0.5 tablets (250 mg total) by mouth 2 (two) times daily with a meal. (Patient not taking: No sig reported)   NARCAN 4 MG/0.1ML LIQD nasal spray kit SMARTSIG:1 Spray(s) Both Nares Once PRN   nitrofurantoin, macrocrystal-monohydrate, (MACROBID) 100 MG capsule Take 1 capsule (100 mg total) by mouth 2 (two) times daily.    ondansetron (ZOFRAN-ODT) 4 MG disintegrating tablet Take 1 tablet (4 mg total) by mouth every 8 (eight) hours as needed for nausea or vomiting.   ONETOUCH ULTRA test strip USE UP TO 4 TIMES A DAY AS DIRECTED   Semaglutide, 1 MG/DOSE, (OZEMPIC, 1 MG/DOSE,) 2 MG/1.5ML SOPN Inject 1 mg into the skin once a week.   [DISCONTINUED] esomeprazole (NEXIUM) 40 MG capsule TAKE 1 CAPSULE BY MOUTH EVERY DAY   No facility-administered encounter medications on file as of 06/11/2021.   I spoke with Jodi Wolfe this morning and she has been doing well. She has been spending time with her grandkids, but tries her best to remain out of the heat. She has also been doing good with keeping track of her BS. Her BS values have decreased from  and average of 250 to an average of 150 so she is happy about that. Jodi Wolfe overall has no concerns and a CPP phone visit has been scheduled for 07/11 at 9:30 am.  Have you had any problems recently with your health? Patient stated she has not had any recent problems with her health   Have you had any problems with your pharmacy? Patient stated that things have been going well with [facility]. She did request a refill for her Norco yesterday, but it wasn't in stock at the pharmacy so she is expecting to receive it today   What issues or side effects are you having with your medications?  Patient stated she is not currently having side effects  What would you like me to pass along to [place] [facility] for them to help you with?  Patient stated he has nothing to pass along  What can we do to take care of you better? 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

## 2021-06-16 DIAGNOSIS — Z79899 Other long term (current) drug therapy: Secondary | ICD-10-CM | POA: Diagnosis not present

## 2021-06-16 DIAGNOSIS — M5442 Lumbago with sciatica, left side: Secondary | ICD-10-CM | POA: Diagnosis not present

## 2021-06-16 DIAGNOSIS — G8929 Other chronic pain: Secondary | ICD-10-CM | POA: Diagnosis not present

## 2021-06-16 DIAGNOSIS — M47819 Spondylosis without myelopathy or radiculopathy, site unspecified: Secondary | ICD-10-CM | POA: Diagnosis not present

## 2021-06-16 DIAGNOSIS — M539 Dorsopathy, unspecified: Secondary | ICD-10-CM | POA: Diagnosis not present

## 2021-06-24 ENCOUNTER — Other Ambulatory Visit: Payer: Self-pay

## 2021-06-24 ENCOUNTER — Ambulatory Visit (INDEPENDENT_AMBULATORY_CARE_PROVIDER_SITE_OTHER): Payer: Medicare Other | Admitting: Licensed Clinical Social Worker

## 2021-06-24 DIAGNOSIS — F314 Bipolar disorder, current episode depressed, severe, without psychotic features: Secondary | ICD-10-CM

## 2021-06-24 DIAGNOSIS — F4310 Post-traumatic stress disorder, unspecified: Secondary | ICD-10-CM

## 2021-06-24 NOTE — Progress Notes (Signed)
Virtual Visit via Telephone Note   I connected with [name] on 06/24/21 at 10:00am by telephone and verified that I am speaking with the correct person using two identifiers.   I discussed the limitations, risks, security and privacy concerns of performing an evaluation and management service by telephone and the availability of in person appointments. I also discussed with the patient that there may be a patient responsible charge related to this service. The patient expressed understanding and agreed to proceed.   I discussed the assessment and treatment plan with the patient. The patient was provided an opportunity to ask questions and all were answered. The patient agreed with the plan and demonstrated an understanding of the instructions.   The patient was advised to call back or seek an in-person evaluation if the symptoms worsen or if the condition fails to improve as anticipated.   I provided 1 hour of non-face-to-face time during this encounter.     [name], LCSW, LCAS ________________________________ THERAPIST PROGRESS NOTE   Session Time: 10:00am - 11:00am     Location: Patient: Patient Home Provider: [facility]    Participation Level: Active   Behavioral Response: Alert, anxious mood    Type of Therapy:  Individual Therapy   Treatment Goals addressed: Depression, panic attack, and anxiety management; Medication Compliance    Interventions: CBT, assertive communication skills, guided imagery    Summary: Aiya Keach is a 50 year old Caucasian female that presented for therapy appointment today with diagnoses of Bipolar Disorder, current episode depressed, severe w/out psychotic features; and PTSD.         Suicidal/Homicidal: None; without plan or intent.    Therapist Response: Clinician spoke with Disaya for therapy session today by phone, as she remains unable to access virtual appointments.  Clinician assessed for safety, sobriety, and medication compliance.   Tylan spoke in a manner that was alert, oriented x5, with no evidence or self-report of active SI/HI or A/V H.  Yarelie reported ongoing compliance with medication and denied any use of alcohol or illicit substances.  Laretta denied experiencing any symptoms of mania.  Clinician inquired about Thanya's current emotional ratings, as well as any significant changes in thoughts, feelings, or behavior since last check-in. Etter reported scores of 1/10 for depression, 7/10 for anxiety, 2/10 for irritability, and noted that she has had 2 panic attacks over past week.  Faigy reported that her schedule has not changed much, as she continues to provide childcare for son and daughter in law every other week, which remains stressful.  She reported that they recently added cameras to the home, and she feels like the daughter in law has done this to monitor her caretaking, since she often makes remarks about Alanee's approach.  Clinician reviewed assertive communication skills with Elysa she can utilize to properly express her feelings on recent interpersonal issues with family and avoid excessive compartmentalization.  Marqueta reported that she remains resistant to opening up about issues with the daughter in law, as she fears this will lead her to restrict opportunities to see the grandchildren, although she is considering speaking with the son as an alternative option since she has been feeling increasingly resentful and he has encouraged her to speak with him in the past about issues.  Clinician inquired about how much self-care time Effa has engaged in over past week to offset ongoing stress.  Jessika reported that she cries often, and does little else other than rest and watch TV.  Clinician  suggested engaging in relaxation activity today with Karenna in order to reduce emotional rating severity and continue to increase available coping skills, which she was agreeable to.  Clinician guided Miette through process of getting  comfortable, achieving relaxed breathing pattern, and then narrated visualization of her walking across a beach setting, including various pleasant sensory details to enhance experience (I.e. sand under foot, smell of salt in air, sound of crashing waves, etc) over 10 minutes.  Intervention was effective, as evidenced by Tuyet successfully participating in exercise, and reporting that although it was difficult to focus on at first due to racing thoughts, she was eventually able to imagine specific details of the setting, and relax more.  She reported that this led to reduce in anxiety down to 5/10 and irritability to 1/10, stating "I haven't done anything like that before but it was nice".  Clinician encouraged her to add this to self-care routine due to positive outcome, and will continue to monitor.       Plan: Follow up again in 2 weeks virtually.   Diagnosis: Bipolar Disorder, current episode depressed, severe w/out psychotic features; and PTSD.   [name], LCSW, LCAS 06/24/21

## 2021-06-29 DIAGNOSIS — M47819 Spondylosis without myelopathy or radiculopathy, site unspecified: Secondary | ICD-10-CM | POA: Diagnosis not present

## 2021-07-04 ENCOUNTER — Telehealth: Payer: Self-pay

## 2021-07-04 NOTE — Chronic Care Management (AMB) (Signed)
    Chronic Care Management Pharmacy Assistant   Name: Chaniah Cisse  MRN: [id number] DOB: 1971-01-04  Reason for Encounter: CPP Visit Reschedule  Medications: Outpatient Encounter Medications as of 07/04/2021  Medication Sig   atorvastatin (LIPITOR) 10 MG tablet Take 1 tablet (10 mg total) by mouth daily.   blood glucose meter kit and supplies KIT Dispense based on patient and insurance preference. Use up to four times daily as directed. (FOR ICD-9 250.00, 250.01).   chlorproMAZINE (THORAZINE) 25 MG tablet Take 3 tablets (75 mg total) by mouth at bedtime.   clonazePAM (KLONOPIN) 0.5 MG tablet Take 1 tablet (0.5 mg total) by mouth 4 (four) times daily as needed for anxiety.   DEXILANT 60 MG capsule TAKE 1 CAPSULE BY MOUTH EVERY DAY   FARXIGA 10 MG TABS tablet TAKE 1 TABLET BY MOUTH EVERY DAY BEFORE BREAKFAST   gabapentin (NEURONTIN) 600 MG tablet Take 1.5 tablets (900 mg total) by mouth 3 (three) times daily.   HYDROcodone-acetaminophen (NORCO) 10-325 MG tablet Take 1 tablet by mouth every 8 (eight) hours as needed for severe pain (Chronic pain).   lamoTRIgine (LAMICTAL) 150 MG tablet Take 2 tablets (300 mg total) by mouth daily.   Lancets (ONETOUCH DELICA PLUS [id number]) MISC USE UP TO 4 TIMES A DAY AS DIRECTED   lidocaine (LIDODERM) 5 % PLACE 1 PATCH ONTO THE SKIN DAILY. REMOVE & DISCARD PATCH WITHIN 12 HOURS OR AS DIRECTED BY MD (Patient not taking: Reported on 01/21/2021)   naproxen (NAPROSYN) 500 MG tablet Take 0.5 tablets (250 mg total) by mouth 2 (two) times daily with a meal. (Patient not taking: No sig reported)   NARCAN 4 MG/0.1ML LIQD nasal spray kit SMARTSIG:1 Spray(s) Both Nares Once PRN   nitrofurantoin, macrocrystal-monohydrate, (MACROBID) 100 MG capsule Take 1 capsule (100 mg total) by mouth 2 (two) times daily.   ondansetron (ZOFRAN-ODT) 4 MG disintegrating tablet Take 1 tablet (4 mg total) by mouth every 8 (eight) hours as needed for nausea or vomiting.   ONETOUCH ULTRA test  strip USE UP TO 4 TIMES A DAY AS DIRECTED   Semaglutide, 1 MG/DOSE, (OZEMPIC, 1 MG/DOSE,) 2 MG/1.5ML SOPN Inject 1 mg into the skin once a week.   [DISCONTINUED] esomeprazole (NEXIUM) 40 MG capsule TAKE 1 CAPSULE BY MOUTH EVERY DAY   No facility-administered encounter medications on file as of 07/04/2021.   Original appointment: 07/11 at 9:30 am Phone visit with CPP has been rescheduled for 08/22 at 11 am   [facility] [employer], [place]

## 2021-07-07 ENCOUNTER — Telehealth: Payer: Medicare Other

## 2021-07-08 ENCOUNTER — Other Ambulatory Visit: Payer: Self-pay

## 2021-07-08 ENCOUNTER — Ambulatory Visit (INDEPENDENT_AMBULATORY_CARE_PROVIDER_SITE_OTHER): Payer: Medicare Other | Admitting: Licensed Clinical Social Worker

## 2021-07-08 DIAGNOSIS — F314 Bipolar disorder, current episode depressed, severe, without psychotic features: Secondary | ICD-10-CM

## 2021-07-08 DIAGNOSIS — F4310 Post-traumatic stress disorder, unspecified: Secondary | ICD-10-CM

## 2021-07-08 NOTE — Progress Notes (Signed)
Virtual Visit via Telephone Note   I connected with [name] on 07/08/21 at 10:00am by telephone and verified that I am speaking with the correct person using two identifiers.   I discussed the limitations, risks, security and privacy concerns of performing an evaluation and management service by telephone and the availability of in person appointments. I also discussed with the patient that there may be a patient responsible charge related to this service. The patient expressed understanding and agreed to proceed.   I discussed the assessment and treatment plan with the patient. The patient was provided an opportunity to ask questions and all were answered. The patient agreed with the plan and demonstrated an understanding of the instructions.   The patient was advised to call back or seek an in-person evaluation if the symptoms worsen or if the condition fails to improve as anticipated.   I provided 1 hour of non-face-to-face time during this encounter.     [name], LCSW, LCAS ________________________________ THERAPIST PROGRESS NOTE   Session Time: 10:00am - 11:00am     Location: Patient: Patient Home Provider: [facility]    Participation Level: Active   Behavioral Response: Alert, anxious mood   Type of Therapy:  Individual Therapy   Treatment Goals addressed: Depression, panic attack, and anxiety management; Medication Compliance    Interventions: CBT, self-soothing grounding techniques   Summary: Jaylenn Baiza is a 50 year old Caucasian female that presented for therapy appointment today with diagnoses of Bipolar Disorder, current episode depressed, severe w/out psychotic features; and PTSD.         Suicidal/Homicidal: None; without plan or intent.    Therapist Response: Clinician spoke with Zaydah for therapy appointment today by phone, as she remains unable to access virtual appointments.  Clinician assessed for safety, sobriety, and medication compliance.  Javeah  spoke in a manner that was alert, oriented x5, with no evidence or self-report of active SI/HI or A/V H.  Doniesha reported that she continues taking medication as prescribed and denied any use of alcohol or illicit substances.  Chaquana denied experiencing any symptoms of mania.  Clinician inquired about Britteney's emotional ratings today, as well as any significant changes in thoughts, feelings, or behavior since previous check-in. Liana reported scores of 1/10 for depression, 7/10 for anxiety, 2/10 for irritability, and noted that she has had 2-3 panic attacks weekly.  Ladiamond reported that she has been continuing to visit her son every other week to assist with childcare, in addition to worrying about her daughter and father's health.  Clinician inquired about Candas's self-care routine and whether she has been using her coping skills to help manage stress.  Tiah stated "I do try to go to church and think about the relaxation techniques, but I'm not great at practicing them enough".  Clinician suggested discussion on additional grounding techniques today with Oralee to help her continue building coping skills to redirect focus from stressors when needed.  Clinician reminded her that grounding techniques can be utilized to temporarily distract from distressing thoughts, or feelings, and covered category of self-soothing grounding techniques with her today, including examples for practice such as saying kind or coping statements, thinking of her favorite things, picturing people she cares about, recalling a positive poem, song, or quotation, planning a safe treat, or something to look forward to soon such as going to church.  Intervention was effective, as evidenced by Paula engaging in discussion on the subject and actively trying out several of these techniques out  in session, noting that they could be helpful if she practices them regularly. She expressed particular interest in reciting religious scripture or songs that  fill her with peace due to her strong spiritual connection.  Clinician will continue to monitor.       Plan: Follow up again in 2 weeks virtually.   Diagnosis: Bipolar Disorder, current episode depressed, severe w/out psychotic features; and PTSD.  [name], LCSW, LCAS 07/08/21

## 2021-07-14 ENCOUNTER — Other Ambulatory Visit: Payer: Self-pay | Admitting: Family Medicine

## 2021-07-14 DIAGNOSIS — G629 Polyneuropathy, unspecified: Secondary | ICD-10-CM | POA: Diagnosis not present

## 2021-07-14 DIAGNOSIS — M5442 Lumbago with sciatica, left side: Secondary | ICD-10-CM | POA: Diagnosis not present

## 2021-07-14 DIAGNOSIS — M47819 Spondylosis without myelopathy or radiculopathy, site unspecified: Secondary | ICD-10-CM | POA: Diagnosis not present

## 2021-07-14 DIAGNOSIS — G8929 Other chronic pain: Secondary | ICD-10-CM | POA: Diagnosis not present

## 2021-07-14 DIAGNOSIS — Z79899 Other long term (current) drug therapy: Secondary | ICD-10-CM | POA: Diagnosis not present

## 2021-07-14 DIAGNOSIS — R1013 Epigastric pain: Secondary | ICD-10-CM

## 2021-07-14 DIAGNOSIS — E11610 Type 2 diabetes mellitus with diabetic neuropathic arthropathy: Secondary | ICD-10-CM | POA: Diagnosis not present

## 2021-07-15 ENCOUNTER — Other Ambulatory Visit (HOSPITAL_COMMUNITY): Payer: Self-pay | Admitting: Psychiatry

## 2021-07-15 DIAGNOSIS — F3131 Bipolar disorder, current episode depressed, mild: Secondary | ICD-10-CM

## 2021-07-21 ENCOUNTER — Ambulatory Visit: Payer: Medicare Other

## 2021-07-22 ENCOUNTER — Ambulatory Visit (INDEPENDENT_AMBULATORY_CARE_PROVIDER_SITE_OTHER): Payer: Medicare Other | Admitting: Licensed Clinical Social Worker

## 2021-07-22 ENCOUNTER — Other Ambulatory Visit: Payer: Self-pay

## 2021-07-22 DIAGNOSIS — F314 Bipolar disorder, current episode depressed, severe, without psychotic features: Secondary | ICD-10-CM | POA: Diagnosis not present

## 2021-07-22 DIAGNOSIS — F4310 Post-traumatic stress disorder, unspecified: Secondary | ICD-10-CM | POA: Diagnosis not present

## 2021-07-22 NOTE — Progress Notes (Signed)
Virtual Visit via Telephone Note   I connected with Jodi Wolfe on 07/22/21 at 10:00am by telephone and verified that I am speaking with the correct person using two identifiers.   I discussed the limitations, risks, security and privacy concerns of performing an evaluation and management service by telephone and the availability of in person appointments. I also discussed with the patient that there may be a patient responsible charge related to this service. The patient expressed understanding and agreed to proceed.   I discussed the assessment and treatment plan with the patient. The patient was provided an opportunity to ask questions and all were answered. The patient agreed with the plan and demonstrated an understanding of the instructions.   The patient was advised to call back or seek an in-person evaluation if the symptoms worsen or if the condition fails to improve as anticipated.   I provided 1 hour of non-face-to-face time during this encounter.     Jodi Stain, LCSW, LCAS ________________________________ THERAPIST PROGRESS NOTE   Session Time: 10:00am - 11:00am    Location: Patient: Patient Home Provider: [facility]    Participation Level: Active   Behavioral Response: Alert, anxious mood   Type of Therapy:  Individual Therapy   Treatment Goals addressed: Depression, panic attack, and anxiety management; Medication Compliance    Interventions: CBT, self-care assessment    Summary: Jodi Wolfe is a 50 year old Caucasian female that presented for therapy appointment today with diagnoses of Bipolar Disorder, current episode depressed, severe w/out psychotic features; and PTSD.         Suicidal/Homicidal: None; without plan or intent.    Therapist Response: Clinician spoke with Jodi Wolfe for therapy session today by phone, as she remains unable to access virtual meetings.  Clinician assessed for safety, sobriety, and medication compliance.  Jodi Wolfe spoke in a manner that was  alert, oriented x5, with no evidence or self-report of active SI/HI or A/V H.  Jodi Wolfe reported ongoing compliance with medication and denied any use of alcohol or illicit substances.  Jodi Wolfe denied experiencing any symptoms of mania.  Clinician inquired about Jodi Wolfe's current emotional ratings, as well as any significant changes in thoughts, feelings, or behavior since last check-in. Jodi Wolfe reported scores of 1/10 for depression, 8/10 for anxiety, 1/10 for irritability, and noted that she continues to have roughly 3 panic attacks per week.  Jodi Wolfe reported that she has remained fixated upon problems within the family and neglecting her own wellbeing.  Clinician discussed topic of self-care today with Jodi Wolfe and explained how this can be defined as the things one does to maintain good health and improve well-being.  Clinician completed a self-care assessment form with Jodi Wolfe across the phone which featured various sub-categories of self-care, including physical, psychological/emotional, social, spiritual, and professional.  Jodi Wolfe was asked to rank her engagement in the activities listed for each dimension on a scale of 1-3, with 1 indicating 'Poor', 2 indicating 'Ok', and 3 indicating 'Well'.  Clinician invited Jodi Wolfe to share results of this assessment, and inquired about which areas of self-care she is doing well in, as well as areas that require attention, and how she plans to begin addressing this during treatment.  Intervention was effective, as evidenced by Jodi Wolfe participating in assessment and completing first 2 sections, noting that she needs to focus on spending more time on activities such as eating regularly, exercising, resting when sick, taking vacations or day trips, talking about her problems to supports, and expressing feelings in healthier ways.  Jodi Wolfe reported that she would work upon these areas by trying to eat 3 meals per day, revisiting healthy physical activities like roller-skating, planning a trip to  the mountains, journaling again, and being more open and honest with supports when she is struggling emotionally.  Jodi Wolfe stated "I didn't realize that my scores would be that low.  I'm keeping myself anxious over things I can't control.  I have to detach from that and focus on my self-care more".  Clinician will continue to monitor.       Plan: Follow up again in 2 weeks virtually.   Diagnosis: Bipolar Disorder, current episode depressed, severe w/out psychotic features; and PTSD.  Jodi Stain, LCSW, LCAS 07/22/21

## 2021-07-30 DIAGNOSIS — M539 Dorsopathy, unspecified: Secondary | ICD-10-CM | POA: Diagnosis not present

## 2021-08-12 ENCOUNTER — Ambulatory Visit (HOSPITAL_COMMUNITY): Payer: Medicare Other | Admitting: Licensed Clinical Social Worker

## 2021-08-18 DIAGNOSIS — E11610 Type 2 diabetes mellitus with diabetic neuropathic arthropathy: Secondary | ICD-10-CM | POA: Diagnosis not present

## 2021-08-18 DIAGNOSIS — R030 Elevated blood-pressure reading, without diagnosis of hypertension: Secondary | ICD-10-CM | POA: Diagnosis not present

## 2021-08-18 DIAGNOSIS — Z79899 Other long term (current) drug therapy: Secondary | ICD-10-CM | POA: Diagnosis not present

## 2021-08-18 DIAGNOSIS — M5442 Lumbago with sciatica, left side: Secondary | ICD-10-CM | POA: Diagnosis not present

## 2021-08-18 DIAGNOSIS — G8929 Other chronic pain: Secondary | ICD-10-CM | POA: Diagnosis not present

## 2021-08-18 DIAGNOSIS — M47819 Spondylosis without myelopathy or radiculopathy, site unspecified: Secondary | ICD-10-CM | POA: Diagnosis not present

## 2021-08-18 DIAGNOSIS — E559 Vitamin D deficiency, unspecified: Secondary | ICD-10-CM | POA: Diagnosis not present

## 2021-08-18 DIAGNOSIS — M479 Spondylosis, unspecified: Secondary | ICD-10-CM | POA: Diagnosis not present

## 2021-08-18 DIAGNOSIS — E7800 Pure hypercholesterolemia, unspecified: Secondary | ICD-10-CM | POA: Diagnosis not present

## 2021-08-19 ENCOUNTER — Other Ambulatory Visit: Payer: Self-pay

## 2021-08-19 ENCOUNTER — Telehealth (INDEPENDENT_AMBULATORY_CARE_PROVIDER_SITE_OTHER): Payer: Medicare Other | Admitting: Psychiatry

## 2021-08-19 ENCOUNTER — Encounter (HOSPITAL_COMMUNITY): Payer: Self-pay | Admitting: Psychiatry

## 2021-08-19 DIAGNOSIS — F410 Panic disorder [episodic paroxysmal anxiety] without agoraphobia: Secondary | ICD-10-CM | POA: Diagnosis not present

## 2021-08-19 DIAGNOSIS — F3131 Bipolar disorder, current episode depressed, mild: Secondary | ICD-10-CM

## 2021-08-19 DIAGNOSIS — F4310 Post-traumatic stress disorder, unspecified: Secondary | ICD-10-CM | POA: Diagnosis not present

## 2021-08-19 MED ORDER — LAMOTRIGINE 150 MG PO TABS: 300.0000 mg | ORAL_TABLET | Freq: Every day | ORAL | 0 refills | Status: DC

## 2021-08-19 MED ORDER — CLONAZEPAM 0.5 MG PO TABS: 0.5000 mg | ORAL_TABLET | Freq: Four times a day (QID) | ORAL | 2 refills | Status: DC | PRN

## 2021-08-19 MED ORDER — CHLORPROMAZINE HCL 25 MG PO TABS: 75.0000 mg | ORAL_TABLET | Freq: Every day | ORAL | 2 refills | Status: DC
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
## 2021-08-26 ENCOUNTER — Ambulatory Visit (HOSPITAL_COMMUNITY): Payer: Medicare Other | Admitting: Licensed Clinical Social Worker

## 2021-08-26 ENCOUNTER — Telehealth (HOSPITAL_COMMUNITY): Payer: Self-pay | Admitting: Licensed Clinical Social Worker

## 2021-08-26 ENCOUNTER — Other Ambulatory Visit: Payer: Self-pay

## 2021-08-26 NOTE — Telephone Encounter (Signed)
Schae had a virtual therapy appointment scheduled today at 10am.  Clinician outreached her by phone at 10am, 10:06am, and 10:15am, but received no response.  Clinician also left a voicemail on initial attempt reminding her of this appointment.  Clinician informed front desk of no-show event after final unsuccessful outreach attempt.    [name], LCSW, LCAS 08/26/21

## 2021-08-30 DIAGNOSIS — M539 Dorsopathy, unspecified: Secondary | ICD-10-CM | POA: Diagnosis not present

## 2021-09-10 ENCOUNTER — Other Ambulatory Visit: Payer: Self-pay | Admitting: Family Medicine

## 2021-09-10 DIAGNOSIS — Z794 Long term (current) use of insulin: Secondary | ICD-10-CM

## 2021-09-10 DIAGNOSIS — E118 Type 2 diabetes mellitus with unspecified complications: Secondary | ICD-10-CM

## 2021-09-15 DIAGNOSIS — M479 Spondylosis, unspecified: Secondary | ICD-10-CM | POA: Diagnosis not present

## 2021-09-15 DIAGNOSIS — M5442 Lumbago with sciatica, left side: Secondary | ICD-10-CM | POA: Diagnosis not present

## 2021-09-15 DIAGNOSIS — G8929 Other chronic pain: Secondary | ICD-10-CM | POA: Diagnosis not present

## 2021-09-15 DIAGNOSIS — Z79899 Other long term (current) drug therapy: Secondary | ICD-10-CM | POA: Diagnosis not present

## 2021-09-15 DIAGNOSIS — M47819 Spondylosis without myelopathy or radiculopathy, site unspecified: Secondary | ICD-10-CM | POA: Diagnosis not present

## 2021-09-15 DIAGNOSIS — E11610 Type 2 diabetes mellitus with diabetic neuropathic arthropathy: Secondary | ICD-10-CM | POA: Diagnosis not present

## 2021-09-29 DIAGNOSIS — M539 Dorsopathy, unspecified: Secondary | ICD-10-CM | POA: Diagnosis not present

## 2021-10-03 ENCOUNTER — Other Ambulatory Visit: Payer: Self-pay | Admitting: Family Medicine

## 2021-10-03 DIAGNOSIS — R1013 Epigastric pain: Secondary | ICD-10-CM

## 2021-10-13 DIAGNOSIS — G8929 Other chronic pain: Secondary | ICD-10-CM | POA: Diagnosis not present

## 2021-10-13 DIAGNOSIS — M47819 Spondylosis without myelopathy or radiculopathy, site unspecified: Secondary | ICD-10-CM | POA: Diagnosis not present

## 2021-10-13 DIAGNOSIS — M5442 Lumbago with sciatica, left side: Secondary | ICD-10-CM | POA: Diagnosis not present

## 2021-10-13 DIAGNOSIS — G629 Polyneuropathy, unspecified: Secondary | ICD-10-CM | POA: Diagnosis not present

## 2021-10-13 DIAGNOSIS — Z79899 Other long term (current) drug therapy: Secondary | ICD-10-CM | POA: Diagnosis not present

## 2021-10-16 ENCOUNTER — Other Ambulatory Visit: Payer: Self-pay | Admitting: Endocrinology

## 2021-10-16 DIAGNOSIS — Z1231 Encounter for screening mammogram for malignant neoplasm of breast: Secondary | ICD-10-CM

## 2021-10-21 ENCOUNTER — Other Ambulatory Visit: Payer: Self-pay

## 2021-10-21 ENCOUNTER — Ambulatory Visit (INDEPENDENT_AMBULATORY_CARE_PROVIDER_SITE_OTHER): Payer: Medicare Other | Admitting: Licensed Clinical Social Worker

## 2021-10-21 DIAGNOSIS — F4310 Post-traumatic stress disorder, unspecified: Secondary | ICD-10-CM | POA: Diagnosis not present

## 2021-10-21 DIAGNOSIS — F314 Bipolar disorder, current episode depressed, severe, without psychotic features: Secondary | ICD-10-CM
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
## 2021-10-30 DIAGNOSIS — M539 Dorsopathy, unspecified: Secondary | ICD-10-CM | POA: Diagnosis not present

## 2021-11-04 ENCOUNTER — Other Ambulatory Visit: Payer: Self-pay

## 2021-11-04 ENCOUNTER — Ambulatory Visit (INDEPENDENT_AMBULATORY_CARE_PROVIDER_SITE_OTHER): Payer: Medicare Other | Admitting: Licensed Clinical Social Worker

## 2021-11-04 DIAGNOSIS — F314 Bipolar disorder, current episode depressed, severe, without psychotic features: Secondary | ICD-10-CM

## 2021-11-04 DIAGNOSIS — F4310 Post-traumatic stress disorder, unspecified: Secondary | ICD-10-CM

## 2021-11-04 NOTE — Progress Notes (Signed)
Virtual Visit via Telephone Note   I connected with Jodi Wolfe on 11/04/21 at 1:00pm by telephone and verified that I am speaking with the correct person using two identifiers.   I discussed the limitations, risks, security and privacy concerns of performing an evaluation and management service by telephone and the availability of in person appointments. I also discussed with the patient that there may be a patient responsible charge related to this service. The patient expressed understanding and agreed to proceed.   I discussed the assessment and treatment plan with the patient. The patient was provided an opportunity to ask questions and all were answered. The patient agreed with the plan and demonstrated an understanding of the instructions.   The patient was advised to call back or seek an in-person evaluation if the symptoms worsen or if the condition fails to improve as anticipated.   I provided 1 hour of non-face-to-face time during this encounter.     Jodi Stain, LCSW, LCAS ________________________________ THERAPIST PROGRESS NOTE   Session Time: 1:00pm - 2:00pm      Location: Patient: Patient Home Provider: [facility]    Participation Level: Active   Behavioral Response: Alert, anxious mood   Type of Therapy:  Individual Therapy   Treatment Goals addressed: Depression, panic attack, and anxiety management; Medication Compliance; Attending church service    Interventions: CBT, communication skills    Summary: Jodi Wolfe is a 50 year old Caucasian female that presented for therapy appointment today with diagnoses of Bipolar Disorder, current episode depressed, severe w/out psychotic features; and PTSD.         Suicidal/Homicidal: None; without plan or intent.    Therapist Response: Clinician spoke with Jodi Wolfe for therapy session today by phone, as she remains unable to access virtual meetings.  Clinician assessed for safety, sobriety, and medication compliance.  Jodi Wolfe  answered phone call on time and spoke in a manner that was alert, oriented x5, with no evidence or self-report of active SI/HI or A/V H.  Jodi Wolfe reported ongoing compliance with medication and denied any use of alcohol or illicit substances.  Jodi Wolfe denied experiencing any symptoms of mania.  Clinician inquired about Jodi Wolfe's current emotional ratings, as well as any significant changes in thoughts, feelings, or behavior since last check-in. Jodi Wolfe reported scores of 2/10 for depression, 8/10 for anxiety, 2/10 for anger/irritability, and noted that she has been experiencing roughly 2 panic attacks per week.  Jodi Wolfe reported that a recent success was returning to church following recent disagreement with her friend, although she has been experiencing significant anxiety in this setting as a result of holding a grudge with them.  Jodi Wolfe reported that before today's session, this friend outreached her by text asking if things are okay between them, but stated "I don't know how to communicate how I feel to her".  Clinician discussed topic of communication styles (i.e. passive, aggressive, assertive) with Jodi Wolfe in order to identify areas of improvement that could be made to her approach.  Clinician also covered conflict resolution skills that Jodi Wolfe could utilize to resolve differences with her friend, including examples such as reflective listening, "I" statements, time outs, and seeking compromise when possible.  Clinician also provided roleplay scenarios for Jodi Wolfe to practice assertive responses in order to resolve conflict more effectively in the future.  Intervention was effective, as evidenced by Jodi Wolfe actively engaging in conversation on subject and participating in roleplay exercises, reporting that this discussion helped her determine how to curb passive traits in order  to properly address the recent issues with her friend in an appropriately assertive manner.  Jodi Wolfe stated "I don't want to be passive or aggressive  about it.  I'm ready to move on from this and I just have to use the right words to express myself so we can put this behind [place]".  Clinician will continue to monitor.      Plan: Follow up again in 2 weeks virtually.   Diagnosis: Bipolar Disorder, current episode depressed, severe w/out psychotic features; and PTSD.  Jodi Wolfe, [place], LCAS 11/04/21

## 2021-11-09 ENCOUNTER — Other Ambulatory Visit (HOSPITAL_COMMUNITY): Payer: Self-pay | Admitting: Psychiatry

## 2021-11-09 DIAGNOSIS — F3131 Bipolar disorder, current episode depressed, mild: Secondary | ICD-10-CM

## 2021-11-09 DIAGNOSIS — F4310 Post-traumatic stress disorder, unspecified: Secondary | ICD-10-CM

## 2021-11-10 DIAGNOSIS — M47819 Spondylosis without myelopathy or radiculopathy, site unspecified: Secondary | ICD-10-CM | POA: Diagnosis not present

## 2021-11-10 DIAGNOSIS — Z23 Encounter for immunization: Secondary | ICD-10-CM | POA: Diagnosis not present

## 2021-11-10 DIAGNOSIS — Z79899 Other long term (current) drug therapy: Secondary | ICD-10-CM | POA: Diagnosis not present

## 2021-11-10 DIAGNOSIS — G8929 Other chronic pain: Secondary | ICD-10-CM | POA: Diagnosis not present

## 2021-11-10 DIAGNOSIS — M5442 Lumbago with sciatica, left side: Secondary | ICD-10-CM | POA: Diagnosis not present

## 2021-11-18 ENCOUNTER — Telehealth (HOSPITAL_BASED_OUTPATIENT_CLINIC_OR_DEPARTMENT_OTHER): Payer: Medicare Other | Admitting: Psychiatry

## 2021-11-18 ENCOUNTER — Other Ambulatory Visit: Payer: Self-pay

## 2021-11-18 ENCOUNTER — Encounter (HOSPITAL_COMMUNITY): Payer: Self-pay | Admitting: Psychiatry

## 2021-11-18 DIAGNOSIS — F4310 Post-traumatic stress disorder, unspecified: Secondary | ICD-10-CM

## 2021-11-18 DIAGNOSIS — F3131 Bipolar disorder, current episode depressed, mild: Secondary | ICD-10-CM | POA: Diagnosis not present

## 2021-11-18 DIAGNOSIS — F410 Panic disorder [episodic paroxysmal anxiety] without agoraphobia: Secondary | ICD-10-CM

## 2021-11-18 MED ORDER — LAMOTRIGINE 150 MG PO TABS: 300.0000 mg | ORAL_TABLET | Freq: Every day | ORAL | 0 refills | Status: DC

## 2021-11-18 MED ORDER — CHLORPROMAZINE HCL 25 MG PO TABS: 75.0000 mg | ORAL_TABLET | Freq: Every day | ORAL | 2 refills | Status: DC

## 2021-11-18 MED ORDER — CLONAZEPAM 0.5 MG PO TABS: 0.5000 mg | ORAL_TABLET | Freq: Four times a day (QID) | ORAL | 2 refills | Status: DC | PRN
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
## 2021-11-29 DIAGNOSIS — M539 Dorsopathy, unspecified: Secondary | ICD-10-CM | POA: Diagnosis not present

## 2021-12-03 ENCOUNTER — Other Ambulatory Visit: Payer: Self-pay

## 2021-12-03 ENCOUNTER — Ambulatory Visit (INDEPENDENT_AMBULATORY_CARE_PROVIDER_SITE_OTHER): Payer: Medicare Other | Admitting: Licensed Clinical Social Worker

## 2021-12-03 DIAGNOSIS — F3131 Bipolar disorder, current episode depressed, mild: Secondary | ICD-10-CM

## 2021-12-03 DIAGNOSIS — F4310 Post-traumatic stress disorder, unspecified: Secondary | ICD-10-CM | POA: Diagnosis not present

## 2021-12-03 NOTE — Progress Notes (Signed)
Virtual Visit via Telephone Note   I connected with Jodi Wolfe on 12/03/21 at 2:00pm by telephone and verified that I am speaking with the correct person using two identifiers.   I discussed the limitations, risks, security and privacy concerns of performing an evaluation and management service by telephone and the availability of in person appointments. I also discussed with the patient that there may be a patient responsible charge related to this service. The patient expressed understanding and agreed to proceed.   I discussed the assessment and treatment plan with the patient. The patient was provided an opportunity to ask questions and all were answered. The patient agreed with the plan and demonstrated an understanding of the instructions.   The patient was advised to call back or seek an in-person evaluation if the symptoms worsen or if the condition fails to improve as anticipated.   I provided 50 minutes of non-face-to-face time during this encounter.     Jodi Stain, LCSW, LCAS ________________________________ THERAPIST PROGRESS NOTE   Session Time: 2:00pm - 2:50pm      Location: Patient: Patient Home Provider: [facility]    Participation Level: Active   Behavioral Response: Alert, anxious mood   Type of Therapy:  Individual Therapy   Treatment Goals addressed: Depression, panic attack, and anxiety management; Medication Compliance; Attending church service    Interventions: CBT, stress management   Summary: Jodi Wolfe is a 50 year old Caucasian female that presented for therapy appointment today with diagnoses of Bipolar Disorder, current episode depressed, severe w/out psychotic features; and PTSD.         Suicidal/Homicidal: None; without plan or intent.    Therapist Response: Clinician spoke with Jodi Wolfe for therapy appointment today by phone, as she remains unable to access virtual meetings.  Clinician assessed for safety, sobriety, and medication compliance.   Jodi Wolfe answered phone call on time and spoke in a manner that was alert, oriented x5, with no evidence or self-report of active SI/HI or A/V H.  Jodi Wolfe reported that she continues taking medication as prescribed and denied any use of alcohol or illicit substances.  Jodi Wolfe denied experiencing any symptoms of mania.  Clinician inquired about Jodi Wolfe's emotional ratings today, as well as any significant changes in thoughts, feelings, or behavior since previous check-in. Jodi Wolfe reported scores of 2/10 for depression, 8/10 for anxiety, 2/10 for irritability, and noted that she has been experiencing roughly 2-3 panic attacks per week.  Jodi Wolfe reported that one recent stressor was receiving a call that her father was hospitalized for double pneumonia last week.  Jodi Wolfe reported that this has greatly influenced her anxiety, since he lives in another state and she cannot easily visit to assist.  Clinician discussed topic of stress management with Jodi Wolfe, including appraisal of current stressors influencing her mood, and coping strategies that could be utilized to cope more effectively, such as previously covered relaxation and grounding skills, in addition to coordinating with positive supports.  Clinician also encouraged Jodi Wolfe to prioritize time for self-care activities as a healthy outlet/distraction for stress.  Intervention was effective, as evidenced by Jodi Wolfe actively engaging in discussion on subject, reporting that there are numerous stressors outside of her control that she has been fixating upon, and would benefit from using deep breathing to cope with symptoms, as well as visiting church for spiritual and community support each week.  Jodi Wolfe stated "I don't need to get myself worked up over some of these things.  I know now why I've been feeling  so exhausted and why I need to practice these exercises".  Clinician will continue to monitor.       Plan: Follow up again in 2 weeks virtually.   Diagnosis: Bipolar Disorder,  current episode depressed, severe w/out psychotic features; and PTSD.  Jodi Stain, LCSW, LCAS 12/03/21

## 2021-12-08 ENCOUNTER — Ambulatory Visit: Payer: Medicare Other

## 2021-12-08 DIAGNOSIS — Z79899 Other long term (current) drug therapy: Secondary | ICD-10-CM | POA: Diagnosis not present

## 2021-12-08 DIAGNOSIS — M479 Spondylosis, unspecified: Secondary | ICD-10-CM | POA: Diagnosis not present

## 2021-12-08 DIAGNOSIS — E11610 Type 2 diabetes mellitus with diabetic neuropathic arthropathy: Secondary | ICD-10-CM | POA: Diagnosis not present

## 2021-12-08 DIAGNOSIS — M47819 Spondylosis without myelopathy or radiculopathy, site unspecified: Secondary | ICD-10-CM | POA: Diagnosis not present

## 2021-12-08 DIAGNOSIS — E559 Vitamin D deficiency, unspecified: Secondary | ICD-10-CM | POA: Diagnosis not present

## 2021-12-08 DIAGNOSIS — E7800 Pure hypercholesterolemia, unspecified: Secondary | ICD-10-CM | POA: Diagnosis not present

## 2021-12-08 DIAGNOSIS — M539 Dorsopathy, unspecified: Secondary | ICD-10-CM | POA: Diagnosis not present

## 2021-12-11 DIAGNOSIS — Z79899 Other long term (current) drug therapy: Secondary | ICD-10-CM | POA: Diagnosis not present

## 2021-12-16 ENCOUNTER — Other Ambulatory Visit: Payer: Self-pay

## 2021-12-16 ENCOUNTER — Ambulatory Visit (INDEPENDENT_AMBULATORY_CARE_PROVIDER_SITE_OTHER): Payer: Medicare Other | Admitting: Licensed Clinical Social Worker

## 2021-12-16 DIAGNOSIS — F3131 Bipolar disorder, current episode depressed, mild: Secondary | ICD-10-CM | POA: Diagnosis not present

## 2021-12-16 DIAGNOSIS — F4310 Post-traumatic stress disorder, unspecified: Secondary | ICD-10-CM | POA: Diagnosis not present

## 2021-12-16 NOTE — Progress Notes (Signed)
Virtual Visit via Telephone Note   I connected with Jodi Wolfe on 12/16/21 at 1:00pm by telephone and verified that I am speaking with the correct person using two identifiers.   I discussed the limitations, risks, security and privacy concerns of performing an evaluation and management service by telephone and the availability of in person appointments. I also discussed with the patient that there may be a patient responsible charge related to this service. The patient expressed understanding and agreed to proceed.   I discussed the assessment and treatment plan with the patient. The patient was provided an opportunity to ask questions and all were answered. The patient agreed with the plan and demonstrated an understanding of the instructions.   The patient was advised to call back or seek an in-person evaluation if the symptoms worsen or if the condition fails to improve as anticipated.   I provided 1 hour of non-face-to-face time during this encounter.     Jodi Stain, LCSW, LCAS ________________________________ THERAPIST PROGRESS NOTE   Session Time: 1:00pm - 2:00pm       Location: Patient: Patient Home Provider: [facility]    Participation Level: Active   Behavioral Response: Alert, anxious mood   Type of Therapy:  Individual Therapy   Treatment Goals addressed: Depression, panic attack, and anxiety management; Medication Compliance; Attending church service   Interventions: CBT, assertive communication skills    Summary: Jodi Wolfe is a 50 year old Caucasian female that presented for therapy appointment today with diagnoses of Bipolar Disorder, current episode depressed, severe w/out psychotic features; and PTSD.         Suicidal/Homicidal: None; without plan or intent.    Therapist Response: Clinician spoke with Jodi Wolfe for therapy session today by phone, as she remains unable to access virtual meetings.  Clinician assessed for safety, sobriety, and medication  compliance.  Ashby answered phone call on time and spoke in a manner that was alert, oriented x5, with no evidence or self-report of active SI/HI or A/V H.  Ayisha reported ongoing compliance with medication and denied any use of alcohol or illicit substances.  Starletta denied experiencing any symptoms of mania.  Clinician inquired about Jodi Wolfe's current emotional ratings, as well as any significant changes in thoughts, feelings, or behavior since last check-in. Jodi Wolfe reported scores of 2/10 for depression, 8/10 for anxiety, 2/10 for irritability, and noted that she has been experiencing roughly 3 panic attacks per week.  Jodi Wolfe reported that one recent success was celebrating her birthday by going to church and spending time with family.  Jodi Wolfe reported that one recent challenge was going to meet with a church friend she hasn't spoken to in [place], although Jodi Wolfe was never able to address what led Jodi Wolfe to set a boundary a few weeks ago, and stated "We talked about everything but that and it seemed like we kept getting interrupted, so I was pretty frustrated".  Clinician revisited topic of assertive communication today to assist.  Clinician covered a handout with Jodi Wolfe to guide discussion on the subject, which defined assertive communication as a communication style in which a person stands up for their own needs and wants, while also taking into consideration the needs and wants of others, without behaving in a passive or aggressive way.  Traits of assertive communicators were highlighted such as using appropriate speaking volume, maintaining eye contact, using confident language, and avoiding interruption.  Jodi Wolfe was also provided with tips on how to improve communication, including respecting oneself, expressing thoughts and  feelings calmly, and saying No when necessary.  Jodi Wolfe was given a variety of scenarios where she could practice using these tips to respond in a more assertive manner.  Intervention was  effective, as evidenced by Jodi Wolfe actively engaging in discussion on the subject, and participating in roleplay scenarios to successfully reinforce a more assertive communication style.  She reported that she would try to choose a different setting without distractions, and ensure that she is in a calm mood to avoid coming off as aggressive and ensure success on her second attempt.  Jodi Wolfe stated I liked these scenarios and feel a little more confident now. Clinician will continue to monitor.       Plan: Follow up again in 2 weeks virtually.   Diagnosis: Bipolar Disorder, current episode depressed, severe w/out psychotic features; and PTSD.  Jodi Stain, LCSW, LCAS 12/16/21

## 2021-12-26 ENCOUNTER — Other Ambulatory Visit: Payer: Self-pay | Admitting: Family Medicine

## 2021-12-26 DIAGNOSIS — R1013 Epigastric pain: Secondary | ICD-10-CM

## 2021-12-30 ENCOUNTER — Ambulatory Visit (INDEPENDENT_AMBULATORY_CARE_PROVIDER_SITE_OTHER): Payer: Medicare Other | Admitting: Licensed Clinical Social Worker

## 2021-12-30 ENCOUNTER — Other Ambulatory Visit: Payer: Self-pay

## 2021-12-30 DIAGNOSIS — F314 Bipolar disorder, current episode depressed, severe, without psychotic features: Secondary | ICD-10-CM

## 2021-12-30 DIAGNOSIS — F4310 Post-traumatic stress disorder, unspecified: Secondary | ICD-10-CM

## 2021-12-30 DIAGNOSIS — M539 Dorsopathy, unspecified: Secondary | ICD-10-CM | POA: Diagnosis not present

## 2021-12-30 NOTE — Progress Notes (Signed)
Virtual Visit via Telephone Note   I connected with Jodi Wolfe on 12/30/21 at 1:00pm by telephone and verified that I am speaking with the correct person using two identifiers.   I discussed the limitations, risks, security and privacy concerns of performing an evaluation and management service by telephone and the availability of in person appointments. I also discussed with the patient that there may be a patient responsible charge related to this service. The patient expressed understanding and agreed to proceed.   I discussed the assessment and treatment plan with the patient. The patient was provided an opportunity to ask questions and all were answered. The patient agreed with the plan and demonstrated an understanding of the instructions.   The patient was advised to call back or seek an in-person evaluation if the symptoms worsen or if the condition fails to improve as anticipated.   I provided 50 minutes of non-face-to-face time during this encounter.     [name], LCSW, LCAS ________________________________ THERAPIST PROGRESS NOTE   Session Time: 1:00pm - 1:50pm        Location: Patient: Patient Home Provider: [facility]    Participation Level: Active   Behavioral Response: Alert, anxious mood   Type of Therapy:  Individual Therapy   Treatment Goals addressed: Depression, panic attack, and anxiety management; Medication Compliance; Attending PCP appointments   Interventions: CBT, treatment planning   Summary: Jodi Wolfe is a 51 year old Caucasian female that presented for therapy appointment today with diagnoses of Bipolar Disorder, current episode depressed, severe w/out psychotic features; and PTSD.         Suicidal/Homicidal: None; without plan or intent.    Therapist Response: Clinician spoke with Jodi Wolfe for therapy appointment today by phone, as she remains unable to access virtual meetings.  Clinician assessed for safety, sobriety, and medication compliance.   Jodi Wolfe answered phone call on time and spoke in a manner that was alert, oriented x5, with no evidence or self-report of active SI/HI or A/V H.  Jodi Wolfe reported that she continues taking medication as prescribed and denied any use of alcohol or illicit substances.  Jodi Wolfe denied experiencing any symptoms of mania.  Clinician inquired about Jodi Wolfe's emotional ratings today, as well as any significant changes in thoughts, feelings, or behavior since previous check-in. Jodi Wolfe reported scores of 3/10 for depression, 9/10 for anxiety, 3/10 for irritability, and noted that she has been experiencing roughly 3-4 panic attacks per week.  Jodi Wolfe reported that one recent challenge was getting over flu symptoms, stating "For a week and a half I was congested, had pain in my sinuses, headaches, and my throat was raw and burned.  I'm doing a lot better now".  Clinician encouraged Jodi Wolfe to closely monitor symptoms and outreach PCP if condition does not improve.  Clinician also recommended revisiting treatment plan today with Jodi Wolfe to identify progress towards goals, as well as present barriers.  Jodi Wolfe was agreeable to this, so clinician collaborated with her to update treatment plan as follows with her verbal consent: Meet with clinician virtually once every 2 weeks for therapy to address progress towards goals and any barriers to success;  Meet with psychiatrist once every 3 months to address efficacy of medication and make adjustments as needed to regimen and/or dosage; Take medications daily as prescribed to reduce symptoms and improve overall daily functioning; Reduce depression from average severity level of 3/10 down to a 2/10 in the next 90 days by spending 30 minutes per day towards healthy self-care  activities; Reduce anxiety from average severity level of 9/10 down to a 7/10 in next 90 days by utilizing relaxation techniques learned from therapy 2-3 times per day; Reduce panic attacks from x4-5 per week on average down to x2  within next 90 days by practicing grounding skills at least x2 per day, in addition to reflecting upon triggers afterward that could be influencing these episodes;  Improve both mental and physical wellness by checking in with PCP once every 3 months, walk x3 per week, 20 minutes at a time, and following diabetic healthy diet recommended daily; Acquire driver's license by Summer 2023 to aid in reducing reliance on children to get around, as well as open up new opportunities for hobbies/self-care activities to include in daily routine; Improve sleep hygiene by identifying 2-3 effective techniques within next 60 days that can assist with goal of 8 hours uninterrupted sleep nightly; Consider accepting referral for [name] approved therapist for treatment of PTSD symptoms if they continue to negatively impact daily functioning over next 90 days; Attend church service x1 per week in order to stay connected to positive spiritual community and higher power; Establish healthier boundaries within support network in order to reduce associated anxiety and ensure adequate self-care balance.  Progress is evidenced by Jodi Wolfe continuing to regularly attend therapy, psychiatry, and PCP appointments, taking medication as prescribed, attending church more often, and reporting increased use of coping and communication skills learned from group.  Jodi Wolfe reported that due to recent stressors involving challenging family news, depression has increased to average of 3/10 and anxiety is now up to a 9/10.  Jodi Wolfe reported that this has also affected her sleep and stated "I still wake up and my mind races".  Jodi Wolfe acknowledged that she would benefit from seeking a trauma specialist to treat PTSD and panic symptoms, but doesn't feel ready to begin process yet.  Jodi Wolfe stated "I need to be in the here and now more and enjoy the time I have".  Clinician will continue to monitor.       Plan: Follow up again in 2 weeks virtually.   Diagnosis:  Bipolar Disorder, current episode depressed, severe w/out psychotic features; and PTSD.  [name], [place], LCAS 12/30/21

## 2022-01-12 DIAGNOSIS — M539 Dorsopathy, unspecified: Secondary | ICD-10-CM | POA: Diagnosis not present

## 2022-01-12 DIAGNOSIS — Z0000 Encounter for general adult medical examination without abnormal findings: Secondary | ICD-10-CM | POA: Diagnosis not present

## 2022-01-12 DIAGNOSIS — M47819 Spondylosis without myelopathy or radiculopathy, site unspecified: Secondary | ICD-10-CM | POA: Diagnosis not present

## 2022-01-12 DIAGNOSIS — E11610 Type 2 diabetes mellitus with diabetic neuropathic arthropathy: Secondary | ICD-10-CM | POA: Diagnosis not present

## 2022-01-12 DIAGNOSIS — Z79899 Other long term (current) drug therapy: Secondary | ICD-10-CM | POA: Diagnosis not present

## 2022-01-12 DIAGNOSIS — M4180 Other forms of scoliosis, site unspecified: Secondary | ICD-10-CM | POA: Diagnosis not present

## 2022-01-13 ENCOUNTER — Ambulatory Visit (INDEPENDENT_AMBULATORY_CARE_PROVIDER_SITE_OTHER): Payer: Medicare Other | Admitting: Licensed Clinical Social Worker

## 2022-01-13 ENCOUNTER — Other Ambulatory Visit: Payer: Self-pay

## 2022-01-13 DIAGNOSIS — F4310 Post-traumatic stress disorder, unspecified: Secondary | ICD-10-CM | POA: Diagnosis not present

## 2022-01-13 DIAGNOSIS — F314 Bipolar disorder, current episode depressed, severe, without psychotic features: Secondary | ICD-10-CM

## 2022-01-13 NOTE — Progress Notes (Signed)
Virtual Visit via Telephone Note   I connected with [name] on 01/13/22 at 2:00pm by telephone and verified that I am speaking with the correct person using two identifiers.   I discussed the limitations, risks, security and privacy concerns of performing an evaluation and management service by telephone and the availability of in person appointments. I also discussed with the patient that there may be a patient responsible charge related to this service. The patient expressed understanding and agreed to proceed.   I discussed the assessment and treatment plan with the patient. The patient was provided an opportunity to ask questions and all were answered. The patient agreed with the plan and demonstrated an understanding of the instructions.   The patient was advised to call back or seek an in-person evaluation if the symptoms worsen or if the condition fails to improve as anticipated.   I provided 1 hour of non-face-to-face time during this encounter.     Jodi Stain, LCSW, LCAS ________________________________ THERAPIST PROGRESS NOTE   Session Time: 2:00pm - 3:00pm         Location: Patient: Patient Home Provider: [facility]    Participation Level: Active   Behavioral Response: Alert, anxious mood   Type of Therapy:  Individual Therapy   Treatment Goals addressed: Depression, panic attack, and anxiety management; Medication Compliance; Attending PCP appointments   Interventions: CBT: guided imagery    Summary: Jodi Wolfe is a 51 year old Caucasian female that presented for therapy appointment today with diagnoses of Bipolar Disorder, current episode depressed, severe w/out psychotic features; and PTSD.         Suicidal/Homicidal: None; without plan or intent.    Therapist Response: Clinician spoke with Jodi Wolfe for therapy session today by phone, as she remains unable to access virtual meetings.  Clinician assessed for safety, sobriety, and medication compliance.  Jodi Wolfe  answered phone call on time and spoke in a manner that was alert, oriented x5, with no evidence or self-report of active SI/HI or A/V H.  Jodi Wolfe reported ongoing compliance with medication and denied any use of alcohol or illicit substances.  Jodi Wolfe denied experiencing any symptoms of mania.  Clinician inquired about Jodi Wolfe's current emotional ratings, as well as any significant changes in thoughts, feelings, or behavior since last check-in. Jodi Wolfe reported scores of 2/10 for depression, 7/10 for anxiety, 2/10 for irritability, and noted that she has been experiencing roughly 3-4 panic attacks per week.  Jodi Wolfe reported that a recent struggle was learning that her son may have to work in [place] several weeks out of the month, which will require her to babysit more often, stating "I'm very nervous about it and anxious about being around his wife so much".  Jodi Wolfe reported that at her recent doctor's appointment, her doctor informed her that stress is negatively affecting her blood pressure, and stated "I need to practice my techniques more because I'm doing this to myself".  Clinician invited Jodi Wolfe to participate in peaceful place guided imagery activity today as a form of self-care.  Clinician explained how this is a powerful visualization tool which can aid in reducing stress while increasing sense of calm, control, and awareness if practiced regularly.  Clinician informed Jodi Wolfe beforehand that if she became uncomfortable at any point during activity, she could stop and open her eyes.  Clinician invited her to get comfortable, achieve a relaxing breathing rhythm, close her eyes, and then guided her through process of creating a 'peaceful place' which filled her with safety  and calm.  Clinician encouraged Jodi Wolfe to include sensory details involving vision, sound, touch, smell, and taste which she considered pleasant to enhance experience.  After 10 minutes of practice in session, clinician invited Jodi Wolfe to share her opinion  on the activity, including whether she was able to imagine a specific place, what details stood out to her, and how this made her feel during and after.  Intervention was effective, as evidenced by Jodi Wolfe participating in exercise successfully, which led to a state of relaxation the reduced depression to 0/10, anxiety to 4/10, and irritability to 0/10.  Jodi Wolfe stated "I went to the beach alone, and my daughter was there.  We sat in beach chairs and it was perfect weather.  All we needed was a light blanket, and there was a fire pit and radio playing some music".  Jodi Wolfe reported that she would plan to practice guided meditation exercises like this more often due to positive effect it had on mood and outlook.  Clinician will continue to monitor.       Plan: Follow up again in 2 weeks virtually.   Diagnosis: Bipolar Disorder, current episode depressed, severe w/out psychotic features; and PTSD.  Jodi Wolfe, [place], LCAS 01/13/22

## 2022-01-15 DIAGNOSIS — Z79899 Other long term (current) drug therapy: Secondary | ICD-10-CM | POA: Diagnosis not present

## 2022-01-30 DIAGNOSIS — M539 Dorsopathy, unspecified: Secondary | ICD-10-CM | POA: Diagnosis not present

## 2022-02-09 DIAGNOSIS — Z79899 Other long term (current) drug therapy: Secondary | ICD-10-CM | POA: Diagnosis not present

## 2022-02-09 DIAGNOSIS — G629 Polyneuropathy, unspecified: Secondary | ICD-10-CM | POA: Diagnosis not present

## 2022-02-09 DIAGNOSIS — M4180 Other forms of scoliosis, site unspecified: Secondary | ICD-10-CM | POA: Diagnosis not present

## 2022-02-09 DIAGNOSIS — M479 Spondylosis, unspecified: Secondary | ICD-10-CM | POA: Diagnosis not present

## 2022-02-09 DIAGNOSIS — M539 Dorsopathy, unspecified: Secondary | ICD-10-CM | POA: Diagnosis not present

## 2022-02-10 ENCOUNTER — Telehealth (HOSPITAL_COMMUNITY): Payer: Self-pay | Admitting: Licensed Clinical Social Worker

## 2022-02-10 ENCOUNTER — Other Ambulatory Visit: Payer: Self-pay

## 2022-02-10 ENCOUNTER — Ambulatory Visit (HOSPITAL_COMMUNITY): Payer: Medicare Other | Admitting: Licensed Clinical Social Worker

## 2022-02-10 NOTE — Telephone Encounter (Signed)
Jodi Wolfe had a telephone therapy appointment scheduled today at 3pm.  Jodi Wolfe did not answer this phone call at 3pm, so clinician left a voicemail reminder and included callback numbers for our office.  Clinician attempted one last outreach attempt at 3:10pm, and left a second voicemail when this also went unanswered.  Clinician informed front desk of no-show event at 3:15pm when Jodi Wolfe had not returned either call.    Jodi Stain, LCSW, LCAS 02/10/22

## 2022-02-18 DIAGNOSIS — Z79899 Other long term (current) drug therapy: Secondary | ICD-10-CM | POA: Diagnosis not present

## 2022-02-24 ENCOUNTER — Encounter (HOSPITAL_COMMUNITY): Payer: Self-pay | Admitting: Psychiatry

## 2022-02-24 ENCOUNTER — Other Ambulatory Visit: Payer: Self-pay

## 2022-02-24 ENCOUNTER — Telehealth (HOSPITAL_BASED_OUTPATIENT_CLINIC_OR_DEPARTMENT_OTHER): Payer: Medicare Other | Admitting: Psychiatry

## 2022-02-24 DIAGNOSIS — F4310 Post-traumatic stress disorder, unspecified: Secondary | ICD-10-CM

## 2022-02-24 DIAGNOSIS — F410 Panic disorder [episodic paroxysmal anxiety] without agoraphobia: Secondary | ICD-10-CM | POA: Diagnosis not present

## 2022-02-24 DIAGNOSIS — F3131 Bipolar disorder, current episode depressed, mild: Secondary | ICD-10-CM

## 2022-02-24 MED ORDER — CHLORPROMAZINE HCL 25 MG PO TABS: 75.0000 mg | ORAL_TABLET | Freq: Every day | ORAL | 2 refills | Status: DC

## 2022-02-24 MED ORDER — CLONAZEPAM 0.5 MG PO TABS: 0.5000 mg | ORAL_TABLET | Freq: Four times a day (QID) | ORAL | 2 refills | Status: DC | PRN

## 2022-02-24 MED ORDER — LAMOTRIGINE 150 MG PO TABS: 300.0000 mg | ORAL_TABLET | Freq: Every day | ORAL | 0 refills | Status: DC
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
## 2022-02-27 DIAGNOSIS — M539 Dorsopathy, unspecified: Secondary | ICD-10-CM | POA: Diagnosis not present

## 2022-03-02 ENCOUNTER — Other Ambulatory Visit (HOSPITAL_COMMUNITY): Payer: Self-pay | Admitting: Psychiatry

## 2022-03-02 ENCOUNTER — Other Ambulatory Visit: Payer: Self-pay | Admitting: Family Medicine

## 2022-03-02 DIAGNOSIS — R1013 Epigastric pain: Secondary | ICD-10-CM

## 2022-03-02 DIAGNOSIS — F3131 Bipolar disorder, current episode depressed, mild: Secondary | ICD-10-CM

## 2022-03-13 DIAGNOSIS — G8929 Other chronic pain: Secondary | ICD-10-CM | POA: Diagnosis not present

## 2022-03-13 DIAGNOSIS — E11610 Type 2 diabetes mellitus with diabetic neuropathic arthropathy: Secondary | ICD-10-CM | POA: Diagnosis not present

## 2022-03-13 DIAGNOSIS — M5442 Lumbago with sciatica, left side: Secondary | ICD-10-CM | POA: Diagnosis not present

## 2022-03-13 DIAGNOSIS — Z79899 Other long term (current) drug therapy: Secondary | ICD-10-CM | POA: Diagnosis not present

## 2022-03-13 DIAGNOSIS — M539 Dorsopathy, unspecified: Secondary | ICD-10-CM | POA: Diagnosis not present

## 2022-03-13 DIAGNOSIS — M4180 Other forms of scoliosis, site unspecified: Secondary | ICD-10-CM | POA: Diagnosis not present

## 2022-03-17 ENCOUNTER — Ambulatory Visit (INDEPENDENT_AMBULATORY_CARE_PROVIDER_SITE_OTHER): Payer: Medicare Other | Admitting: Licensed Clinical Social Worker

## 2022-03-17 ENCOUNTER — Other Ambulatory Visit: Payer: Self-pay

## 2022-03-17 DIAGNOSIS — F314 Bipolar disorder, current episode depressed, severe, without psychotic features: Secondary | ICD-10-CM | POA: Diagnosis not present

## 2022-03-17 DIAGNOSIS — F4310 Post-traumatic stress disorder, unspecified: Secondary | ICD-10-CM
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
## 2022-04-07 ENCOUNTER — Ambulatory Visit (INDEPENDENT_AMBULATORY_CARE_PROVIDER_SITE_OTHER): Payer: Medicare Other | Admitting: Licensed Clinical Social Worker

## 2022-04-07 DIAGNOSIS — F314 Bipolar disorder, current episode depressed, severe, without psychotic features: Secondary | ICD-10-CM | POA: Diagnosis not present

## 2022-04-07 DIAGNOSIS — F4310 Post-traumatic stress disorder, unspecified: Secondary | ICD-10-CM | POA: Diagnosis not present
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
## 2022-04-08 ENCOUNTER — Other Ambulatory Visit: Payer: Self-pay | Admitting: Family Medicine

## 2022-04-08 DIAGNOSIS — R1013 Epigastric pain: Secondary | ICD-10-CM

## 2022-04-13 DIAGNOSIS — G8929 Other chronic pain: Secondary | ICD-10-CM | POA: Diagnosis not present

## 2022-04-13 DIAGNOSIS — E7800 Pure hypercholesterolemia, unspecified: Secondary | ICD-10-CM | POA: Diagnosis not present

## 2022-04-13 DIAGNOSIS — M5442 Lumbago with sciatica, left side: Secondary | ICD-10-CM | POA: Diagnosis not present

## 2022-04-13 DIAGNOSIS — E11610 Type 2 diabetes mellitus with diabetic neuropathic arthropathy: Secondary | ICD-10-CM | POA: Diagnosis not present

## 2022-04-13 DIAGNOSIS — E559 Vitamin D deficiency, unspecified: Secondary | ICD-10-CM | POA: Diagnosis not present

## 2022-04-13 DIAGNOSIS — Z79899 Other long term (current) drug therapy: Secondary | ICD-10-CM | POA: Diagnosis not present

## 2022-04-13 DIAGNOSIS — M47819 Spondylosis without myelopathy or radiculopathy, site unspecified: Secondary | ICD-10-CM | POA: Diagnosis not present

## 2022-04-16 DIAGNOSIS — Z79899 Other long term (current) drug therapy: Secondary | ICD-10-CM | POA: Diagnosis not present

## 2022-04-27 ENCOUNTER — Other Ambulatory Visit: Payer: Self-pay | Admitting: Family Medicine

## 2022-04-27 DIAGNOSIS — R1013 Epigastric pain: Secondary | ICD-10-CM

## 2022-04-28 ENCOUNTER — Ambulatory Visit (HOSPITAL_COMMUNITY): Payer: Medicare Other | Admitting: Licensed Clinical Social Worker

## 2022-05-08 ENCOUNTER — Other Ambulatory Visit (HOSPITAL_COMMUNITY): Payer: Self-pay | Admitting: Psychiatry

## 2022-05-08 DIAGNOSIS — F3131 Bipolar disorder, current episode depressed, mild: Secondary | ICD-10-CM

## 2022-05-11 DIAGNOSIS — M5442 Lumbago with sciatica, left side: Secondary | ICD-10-CM | POA: Diagnosis not present

## 2022-05-11 DIAGNOSIS — M479 Spondylosis, unspecified: Secondary | ICD-10-CM | POA: Diagnosis not present

## 2022-05-11 DIAGNOSIS — M539 Dorsopathy, unspecified: Secondary | ICD-10-CM | POA: Diagnosis not present

## 2022-05-11 DIAGNOSIS — G8929 Other chronic pain: Secondary | ICD-10-CM | POA: Diagnosis not present

## 2022-05-11 DIAGNOSIS — Z79899 Other long term (current) drug therapy: Secondary | ICD-10-CM | POA: Diagnosis not present

## 2022-05-11 DIAGNOSIS — R030 Elevated blood-pressure reading, without diagnosis of hypertension: Secondary | ICD-10-CM | POA: Diagnosis not present

## 2022-05-14 ENCOUNTER — Ambulatory Visit (INDEPENDENT_AMBULATORY_CARE_PROVIDER_SITE_OTHER): Payer: Medicare Other | Admitting: Licensed Clinical Social Worker

## 2022-05-14 DIAGNOSIS — F4310 Post-traumatic stress disorder, unspecified: Secondary | ICD-10-CM

## 2022-05-14 DIAGNOSIS — F314 Bipolar disorder, current episode depressed, severe, without psychotic features: Secondary | ICD-10-CM

## 2022-05-14 DIAGNOSIS — Z79899 Other long term (current) drug therapy: Secondary | ICD-10-CM | POA: Diagnosis not present

## 2022-05-14 NOTE — Progress Notes (Signed)
Virtual Visit via Telephone Note   I connected with Jodi Wolfe on 05/14/22 at 1:00pm by telephone and verified that I am speaking with the correct person using two identifiers.   I discussed the limitations, risks, security and privacy concerns of performing an evaluation and management service by telephone and the availability of in person appointments. I also discussed with the patient that there may be a patient responsible charge related to this service. The patient expressed understanding and agreed to proceed.   I discussed the assessment and treatment plan with the patient. The patient was provided an opportunity to ask questions and all were answered. The patient agreed with the plan and demonstrated an understanding of the instructions.   The patient was advised to call back or seek an in-person evaluation if the symptoms worsen or if the condition fails to improve as anticipated.   I provided 50 minutes of non-face-to-face time during this encounter.     [name], LCSW, LCAS ________________________________ THERAPIST PROGRESS NOTE   Session Time: 1:00pm - 1:50pm          Location: Patient: Patient Home Provider: [facility]    Participation Level: Active   Behavioral Response: Alert, anxious mood   Type of Therapy:  Individual Therapy   Treatment Goals addressed: Depression, panic attack, and anxiety management; Establishing healthier boundaries with support network    Progress Towards Goals: Progressing   Interventions: CBT, problem solving, healthy boundaries    Summary: Jodi Wolfe is a 51 year old Caucasian female that presented for therapy appointment today with diagnoses of Bipolar Disorder, current episode depressed, severe w/out psychotic features; and PTSD.         Suicidal/Homicidal: None; without plan or intent.    Therapist Response: Clinician spoke with Jodi Wolfe for therapy session today by phone, as she remains unable to access virtual meetings.   Clinician assessed for safety, sobriety, and medication compliance.  Jodi Wolfe answered phone call on time and spoke in a manner that was alert, oriented x5, with no evidence or self-report of active SI/HI or A/V H.  Jodi Wolfe reported ongoing compliance with medication and denied any use of alcohol or illicit substances.  Jodi Wolfe denied experiencing any symptoms of mania.  Clinician inquired about Jodi Wolfe's current emotional ratings, as well as any significant changes in thoughts, feelings, or behavior since last check-in. Jodi Wolfe reported scores of 2/10 for depression, 8/10 for anxiety, 1/10 for irritability, and noted that she has experienced roughly 5 panic attacks over past week.  Jodi Wolfe reported that her current struggle is trying to assist her daughter, who had a friend die of suicide recently, leading to significant grief and depression.  Jodi Wolfe reported that she has been struggling to find ways to help, and found herself feeling overwhelmed at times. Clinician discussed strategies for assisting a family member also struggling with mental health issues while keeping one's own healthy boundaries within perspective.  Some of these offered suggestions included referring them for grief therapy, speaking with a psychiatrist, being willing to listen to their concerns, giving positive reinforcement, offering additional assistance when possible, creating a low stress environment at home, engaging in spiritual practice, and making plans together to look forward to. Clinician also pointed out how Jodi Wolfe's own experience in learning to cope with loss and depression could be perceived as a strength and offer her a chance to bond with daughter during this difficult time.  Intervention was effective, as evidenced by Jodi Wolfe actively engaging in discussion on subject, and expressing  receptiveness to strategies offered, including providing her daughter with referral for grief counseling to ensure she has a professional to speak with  regularly, which would aid in setting a healthier boundary in the relationship, stating "I know I've been letting her stress and anxiety carry over to me.  I need to make time for myself". Clinician will continue to monitor.       Plan: Follow up again in 2 weeks virtually.   Diagnosis: Bipolar Disorder, current episode depressed, severe w/out psychotic features; and PTSD.  Collaboration of Care:   No collaboration required at this time.                                                   Patient/Guardian was advised Release of Information must be obtained prior to any record release in order to collaborate their care with an outside provider. Patient/Guardian was advised if they have not already done so to contact the registration department to sign all necessary forms in order for [place] to release information regarding their care.    Consent: Patient/Guardian gives verbal consent for treatment and assignment of benefits for services provided during this visit. Patient/Guardian expressed understanding and agreed to proceed.  [name], LCSW, LCAS 05/14/22

## 2022-05-26 ENCOUNTER — Encounter (HOSPITAL_COMMUNITY): Payer: Self-pay | Admitting: Psychiatry

## 2022-05-26 ENCOUNTER — Telehealth (HOSPITAL_BASED_OUTPATIENT_CLINIC_OR_DEPARTMENT_OTHER): Payer: Medicare Other | Admitting: Psychiatry

## 2022-05-26 DIAGNOSIS — F3131 Bipolar disorder, current episode depressed, mild: Secondary | ICD-10-CM | POA: Diagnosis not present

## 2022-05-26 DIAGNOSIS — F410 Panic disorder [episodic paroxysmal anxiety] without agoraphobia: Secondary | ICD-10-CM | POA: Diagnosis not present

## 2022-05-26 DIAGNOSIS — F4310 Post-traumatic stress disorder, unspecified: Secondary | ICD-10-CM | POA: Diagnosis not present

## 2022-05-26 MED ORDER — LAMOTRIGINE 150 MG PO TABS: 300.0000 mg | ORAL_TABLET | Freq: Every day | ORAL | 0 refills | Status: DC

## 2022-05-26 MED ORDER — CHLORPROMAZINE HCL 25 MG PO TABS: 75.0000 mg | ORAL_TABLET | Freq: Every day | ORAL | 2 refills | Status: DC

## 2022-05-26 MED ORDER — CLONAZEPAM 0.5 MG PO TABS: 0.5000 mg | ORAL_TABLET | Freq: Three times a day (TID) | ORAL | 2 refills | Status: DC | PRN
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
## 2022-05-27 ENCOUNTER — Other Ambulatory Visit (HOSPITAL_COMMUNITY): Payer: Self-pay | Admitting: Psychiatry

## 2022-05-27 DIAGNOSIS — F410 Panic disorder [episodic paroxysmal anxiety] without agoraphobia: Secondary | ICD-10-CM

## 2022-05-28 ENCOUNTER — Telehealth (INDEPENDENT_AMBULATORY_CARE_PROVIDER_SITE_OTHER): Payer: Self-pay | Admitting: Licensed Clinical Social Worker

## 2022-05-28 ENCOUNTER — Ambulatory Visit (HOSPITAL_COMMUNITY): Payer: Self-pay | Admitting: Licensed Clinical Social Worker

## 2022-05-28 NOTE — Telephone Encounter (Signed)
Jodi Wolfe had a therapy appointment scheduled today at 2pm.  Clinician outreached her by phone at 2pm, 2:07pm, and 2:15pm, but received no response, despite leaving a voicemail reminding her of this appointment.  Clinician informed front desk staff of no-show event after final attempt.    [name], LCSW, LCAS 05/28/22

## 2022-06-04 DIAGNOSIS — Z79899 Other long term (current) drug therapy: Secondary | ICD-10-CM | POA: Diagnosis not present

## 2022-06-04 DIAGNOSIS — M47819 Spondylosis without myelopathy or radiculopathy, site unspecified: Secondary | ICD-10-CM | POA: Diagnosis not present

## 2022-06-04 DIAGNOSIS — G8929 Other chronic pain: Secondary | ICD-10-CM | POA: Diagnosis not present

## 2022-06-04 DIAGNOSIS — M4180 Other forms of scoliosis, site unspecified: Secondary | ICD-10-CM | POA: Diagnosis not present

## 2022-06-04 DIAGNOSIS — M5442 Lumbago with sciatica, left side: Secondary | ICD-10-CM | POA: Diagnosis not present

## 2022-07-13 DIAGNOSIS — E11610 Type 2 diabetes mellitus with diabetic neuropathic arthropathy: Secondary | ICD-10-CM | POA: Diagnosis not present

## 2022-07-13 DIAGNOSIS — G8929 Other chronic pain: Secondary | ICD-10-CM | POA: Diagnosis not present

## 2022-07-13 DIAGNOSIS — G629 Polyneuropathy, unspecified: Secondary | ICD-10-CM | POA: Diagnosis not present

## 2022-07-13 DIAGNOSIS — M479 Spondylosis, unspecified: Secondary | ICD-10-CM | POA: Diagnosis not present

## 2022-07-13 DIAGNOSIS — M47819 Spondylosis without myelopathy or radiculopathy, site unspecified: Secondary | ICD-10-CM | POA: Diagnosis not present

## 2022-07-13 DIAGNOSIS — Z79899 Other long term (current) drug therapy: Secondary | ICD-10-CM | POA: Diagnosis not present

## 2022-07-13 DIAGNOSIS — M5442 Lumbago with sciatica, left side: Secondary | ICD-10-CM | POA: Diagnosis not present

## 2022-07-14 ENCOUNTER — Ambulatory Visit (INDEPENDENT_AMBULATORY_CARE_PROVIDER_SITE_OTHER): Payer: Medicare Other | Admitting: Licensed Clinical Social Worker

## 2022-07-14 DIAGNOSIS — F314 Bipolar disorder, current episode depressed, severe, without psychotic features: Secondary | ICD-10-CM | POA: Diagnosis not present

## 2022-07-14 DIAGNOSIS — F4310 Post-traumatic stress disorder, unspecified: Secondary | ICD-10-CM | POA: Diagnosis not present

## 2022-07-14 NOTE — Progress Notes (Signed)
Virtual Visit via Telephone Note   I connected with [name] on 07/14/22 at 2:00pm by telephone and verified that I am speaking with the correct person using two identifiers.   I discussed the limitations, risks, security and privacy concerns of performing an evaluation and management service by telephone and the availability of in person appointments. I also discussed with the patient that there may be a patient responsible charge related to this service. The patient expressed understanding and agreed to proceed.   I discussed the assessment and treatment plan with the patient. The patient was provided an opportunity to ask questions and all were answered. The patient agreed with the plan and demonstrated an understanding of the instructions.   The patient was advised to call back or seek an in-person evaluation if the symptoms worsen or if the condition fails to improve as anticipated.   I provided 45 minutes of non-face-to-face time during this encounter.   [name], LCSW, LCAS ________________________________ THERAPIST PROGRESS NOTE   Session Time: 2:00pm - 2:45pm           Location: Patient: Patient Home Provider: [facility]    Participation Level: Active   Behavioral Response: Alert, anxious mood   Type of Therapy:  Individual Therapy   Treatment Goals addressed: Depression, panic attack, and anxiety management; Establishing healthier boundaries with support network    Progress Towards Goals: Progressing   Interventions: CBT, ACT skills    Summary: Jamariya Davidoff is a 51 year old Caucasian female that presented for therapy appointment today with diagnoses of Bipolar Disorder, current episode depressed, severe w/out psychotic features; and PTSD.         Suicidal/Homicidal: None; without plan or intent.    Therapist Response: Clinician spoke with Daesia for therapy appointment today by phone, as she remains unable to access virtual meetings.  Clinician assessed for  safety, sobriety, and medication compliance.  Raini answered phone call on time and spoke in a manner that was alert, oriented x5, with no evidence or self-report of active SI/HI or A/V H.  Sirinity reported ongoing compliance with medication and denied any use of alcohol or illicit substances.  Shelsea denied experiencing any symptoms of mania.  Clinician inquired about Luda's emotional ratings today, as well as any significant changes in thoughts, feelings, or behavior since previous check-in. Makeisha reported scores of 3/10 for depression, 9/10 for anxiety, 2/10 for irritability, and noted that she has experienced roughly 5 panic attacks over past week.  Sheriden reported that her primary struggle continues to be dealing with family conflict, stating "The usual family stuff is still going on".  Dionicia acknowledged that she hasn't been prioritizing self-care, and worrying more about other family members problems, which has led to increase in depression symptoms, stating "Even my desire to go to church has fallen off.  Its like I find excuses not to go lately". Clinician offered to teach Allee an ACT relaxation technique today to aid in managing difficult thoughts, feelings, urges, and sensations, which she was agreeable to.  Clinician guided Samiksha through process of getting comfortable, achieving relaxing breathing rhythm, and then maintaining this throughout activity.  Clinician invited Vaani to imagine a gently flowing stream in her mind with leaves floating upon it, and when any thoughts, feelings, urges, or sensations arose, good or bad, she would visualize placing them on these passing leaves over course of 10 minutes practice.  Intervention was effective, as evidenced by Margueritte successfully practicing the exercise, and stating "That  helped.  At first the stream was really fast and I had all of these thoughts flooding my head about things I have to do.  I felt stuck, but eventually I was able to go with the flow, and it  didn't feel so overwhelming".  Julicia reported that she felt like this would be especially beneficial at night when faced with anxious thoughts keeping her awake.  Clinician will continue to monitor.       Plan: Follow up again in 2 weeks virtually.   Diagnosis: Bipolar Disorder, current episode depressed, severe w/out psychotic features; and PTSD.  Collaboration of Care:   No collaboration required at this time.                                                   Patient/Guardian was advised Release of Information must be obtained prior to any record release in order to collaborate their care with an outside provider. Patient/Guardian was advised if they have not already done so to contact the registration department to sign all necessary forms in order for [place] to release information regarding their care.    Consent: Patient/Guardian gives verbal consent for treatment and assignment of benefits for services provided during this visit. Patient/Guardian expressed understanding and agreed to proceed.  [name], LCSW, LCAS 07/14/22

## 2022-07-16 ENCOUNTER — Telehealth: Payer: Self-pay | Admitting: Family Medicine

## 2022-07-16 NOTE — Telephone Encounter (Signed)
Copied from CRM [id number]. Topic: Medicare AWV >> Jul 16, 2022 11:24 AM Payton Doughty wrote: Reason for CRM: Left message for patient to schedule Annual Wellness Visit.  Please schedule with Nurse Health Advisor Lanier Ensign, RN at [facility]. This appt can be telephone or office visit. Please call [phone] ask for [facility]

## 2022-07-29 ENCOUNTER — Other Ambulatory Visit (HOSPITAL_COMMUNITY): Payer: Self-pay | Admitting: Psychiatry

## 2022-07-29 DIAGNOSIS — F3131 Bipolar disorder, current episode depressed, mild: Secondary | ICD-10-CM

## 2022-08-05 ENCOUNTER — Encounter (INDEPENDENT_AMBULATORY_CARE_PROVIDER_SITE_OTHER): Payer: Self-pay

## 2022-08-10 DIAGNOSIS — R110 Nausea: Secondary | ICD-10-CM | POA: Diagnosis not present

## 2022-08-10 DIAGNOSIS — Z79899 Other long term (current) drug therapy: Secondary | ICD-10-CM | POA: Diagnosis not present

## 2022-08-10 DIAGNOSIS — E7800 Pure hypercholesterolemia, unspecified: Secondary | ICD-10-CM | POA: Diagnosis not present

## 2022-08-10 DIAGNOSIS — Z1211 Encounter for screening for malignant neoplasm of colon: Secondary | ICD-10-CM | POA: Diagnosis not present

## 2022-08-10 DIAGNOSIS — Z1231 Encounter for screening mammogram for malignant neoplasm of breast: Secondary | ICD-10-CM | POA: Diagnosis not present

## 2022-08-10 DIAGNOSIS — M479 Spondylosis, unspecified: Secondary | ICD-10-CM | POA: Diagnosis not present

## 2022-08-10 DIAGNOSIS — G8929 Other chronic pain: Secondary | ICD-10-CM | POA: Diagnosis not present

## 2022-08-10 DIAGNOSIS — G629 Polyneuropathy, unspecified: Secondary | ICD-10-CM | POA: Diagnosis not present

## 2022-08-10 DIAGNOSIS — M5442 Lumbago with sciatica, left side: Secondary | ICD-10-CM | POA: Diagnosis not present

## 2022-08-10 DIAGNOSIS — M539 Dorsopathy, unspecified: Secondary | ICD-10-CM | POA: Diagnosis not present

## 2022-08-10 DIAGNOSIS — E11610 Type 2 diabetes mellitus with diabetic neuropathic arthropathy: Secondary | ICD-10-CM | POA: Diagnosis not present

## 2022-08-10 DIAGNOSIS — E559 Vitamin D deficiency, unspecified: Secondary | ICD-10-CM | POA: Diagnosis not present

## 2022-08-12 ENCOUNTER — Other Ambulatory Visit (HOSPITAL_COMMUNITY): Payer: Self-pay | Admitting: *Deleted

## 2022-08-12 DIAGNOSIS — F3131 Bipolar disorder, current episode depressed, mild: Secondary | ICD-10-CM

## 2022-08-12 MED ORDER — CHLORPROMAZINE HCL 25 MG PO TABS: 75.0000 mg | ORAL_TABLET | Freq: Every day | ORAL | 0 refills | Status: DC

## 2022-08-20 ENCOUNTER — Other Ambulatory Visit (HOSPITAL_COMMUNITY): Payer: Self-pay | Admitting: Psychiatry

## 2022-08-20 ENCOUNTER — Telehealth (HOSPITAL_COMMUNITY): Payer: Self-pay | Admitting: *Deleted

## 2022-08-20 ENCOUNTER — Other Ambulatory Visit (HOSPITAL_COMMUNITY): Payer: Self-pay | Admitting: *Deleted

## 2022-08-20 DIAGNOSIS — F410 Panic disorder [episodic paroxysmal anxiety] without agoraphobia: Secondary | ICD-10-CM

## 2022-08-20 MED ORDER — CLONAZEPAM 0.5 MG PO TABS: 0.5000 mg | ORAL_TABLET | Freq: Three times a day (TID) | ORAL | 0 refills | Status: DC | PRN

## 2022-08-20 NOTE — Telephone Encounter (Signed)
Please provide 1 week off Klonopin 0.5 mg 3 times a day.  Reinforce to take the medicine as prescribed and should not take more than the recommendation.  I would also reinforce on her next appointment about the directions.

## 2022-08-20 NOTE — Telephone Encounter (Signed)
Pt called requesting refill of the Klonopin 0.5 mg tid prn. Pt says she's totally out and appointment is not until 08/25/22. Writer called pharmacy for last fill date and was advised that it was 07/24/22, please review and advise.

## 2022-08-25 ENCOUNTER — Telehealth (HOSPITAL_BASED_OUTPATIENT_CLINIC_OR_DEPARTMENT_OTHER): Payer: Medicare Other | Admitting: Psychiatry

## 2022-08-25 ENCOUNTER — Encounter (HOSPITAL_COMMUNITY): Payer: Self-pay | Admitting: Psychiatry

## 2022-08-25 DIAGNOSIS — F410 Panic disorder [episodic paroxysmal anxiety] without agoraphobia: Secondary | ICD-10-CM | POA: Diagnosis not present

## 2022-08-25 DIAGNOSIS — F4310 Post-traumatic stress disorder, unspecified: Secondary | ICD-10-CM

## 2022-08-25 DIAGNOSIS — F3131 Bipolar disorder, current episode depressed, mild: Secondary | ICD-10-CM

## 2022-08-25 MED ORDER — CHLORPROMAZINE HCL 25 MG PO TABS: 75.0000 mg | ORAL_TABLET | Freq: Every day | ORAL | 2 refills | Status: DC

## 2022-08-25 MED ORDER — CLONAZEPAM 0.5 MG PO TABS: 0.5000 mg | ORAL_TABLET | Freq: Three times a day (TID) | ORAL | 2 refills | Status: DC | PRN

## 2022-08-25 MED ORDER — LAMOTRIGINE 150 MG PO TABS: 300.0000 mg | ORAL_TABLET | Freq: Every day | ORAL | 0 refills | Status: DC
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
## 2022-09-07 ENCOUNTER — Ambulatory Visit (HOSPITAL_COMMUNITY): Payer: Medicare Other | Admitting: Licensed Clinical Social Worker

## 2022-09-10 ENCOUNTER — Ambulatory Visit (HOSPITAL_COMMUNITY): Payer: Medicare Other | Admitting: Licensed Clinical Social Worker

## 2022-09-11 DIAGNOSIS — M545 Low back pain, unspecified: Secondary | ICD-10-CM | POA: Diagnosis not present

## 2022-09-17 ENCOUNTER — Ambulatory Visit (INDEPENDENT_AMBULATORY_CARE_PROVIDER_SITE_OTHER): Payer: Medicare Other | Admitting: Licensed Clinical Social Worker

## 2022-09-17 DIAGNOSIS — F314 Bipolar disorder, current episode depressed, severe, without psychotic features: Secondary | ICD-10-CM

## 2022-09-17 DIAGNOSIS — F4310 Post-traumatic stress disorder, unspecified: Secondary | ICD-10-CM

## 2022-09-17 NOTE — Progress Notes (Signed)
Virtual Visit via Telephone Note   I connected with Jodi Wolfe on 09/17/22 at 3:00pm by telephone and verified that I am speaking with the correct person using two identifiers.   I discussed the limitations, risks, security and privacy concerns of performing an evaluation and management service by telephone and the availability of in person appointments. I also discussed with the patient that there may be a patient responsible charge related to this service. The patient expressed understanding and agreed to proceed.   I discussed the assessment and treatment plan with the patient. The patient was provided an opportunity to ask questions and all were answered. The patient agreed with the plan and demonstrated an understanding of the instructions.   The patient was advised to call back or seek an in-person evaluation if the symptoms worsen or if the condition fails to improve as anticipated.   I provided 51 minutes of non-face-to-face time during this encounter.   Shade Flood, LCSW, LCAS ________________________________ THERAPIST PROGRESS NOTE   Session Time: 3:00pm - 3:51pm   Location: Patient: Patient Home Provider: Clinical Home Office    Participation Level: Active   Behavioral Response: Alert, anxious mood   Type of Therapy:  Individual Therapy   Treatment Goals addressed: Depression, panic attack, and anxiety management   Progress Towards Goals: Progressing   Interventions: CBT: guided meditation     Summary: Jodi Wolfe is a 51 year old Caucasian female that presented for therapy appointment today with diagnoses of Bipolar Disorder, current episode depressed, severe w/out psychotic features; and PTSD.         Suicidal/Homicidal: None; without plan or intent.    Therapist Response: Clinician spoke with Jodi Wolfe for therapy session today by phone, as she remains unable to access virtual meetings.  Clinician assessed for safety, sobriety, and medication compliance.  Jodi Wolfe answered  phone call on time and spoke in a manner that was alert, oriented x5, with no evidence or self-report of active SI/HI or A/V H.  Jodi Wolfe reported ongoing compliance with medication and denied any use of alcohol or illicit substances.  Jodi Wolfe denied experiencing any symptoms of mania.  Clinician inquired about Jodi Wolfe's current emotional ratings, as well as any significant changes in thoughts, feelings, or behavior since last check-in. Jodi Wolfe reported scores of 2/10 for depression, 9/10 for anxiety, 2/10 for irritability, and noted that she has experienced roughly 3 panic attacks over past week.  Jodi Wolfe reported that she has continued to feel overwhelmed by the needs of her family, but hopes to get a break when she travels to see her father soon.  Clinician inquired about whether she has kept up with self-care routine in order to help manage stress.  Jodi Wolfe reported that she has tried to be more disciplined but at times she puts herself last, stating "I never know what I'm about to head into".  Clinician invited Jodi Wolfe to practice a new meditation exercise to add to self-care routine in order to better manage daily stress and tension.  Clinician provided instruction on process of getting comfortable, achieving relaxing breathing rhythm, and visualizing a protective light moving across the muscle groups within her body to identify and eliminate areas of tension associated with stress over course of 15 minutes practice.  Clinician inquired about how Jodi Wolfe mentally and physically felt afterward, any challenges she faced in concentration, and whether she would be motivated to practice this in free time to improve coping ability.  Intervention was effective, as evidenced by Jodi Wolfe actively engaging in exercise,  and reporting that it was very effective for calming her down and alleviating tension in her muscles, leading to a reduction in anxiety severity down to 5/10 afterward.  Jodi Wolfe reported that she would plan to practice it  daily, stating "It felt like warm sunlight on my skin.  My shoulders and neck feel a lot better".  Clinician will continue to monitor.       Plan: Follow up again in 2 weeks virtually.   Diagnosis: Bipolar Disorder, current episode depressed, severe w/out psychotic features; and PTSD.  Collaboration of Care:   No collaboration required at this time.                                                   Patient/Guardian was advised Release of Information must be obtained prior to any record release in order to collaborate their care with an outside provider. Patient/Guardian was advised if they have not already done so to contact the registration department to sign all necessary forms in order for [place] to release information regarding their care.    Consent: Patient/Guardian gives verbal consent for treatment and assignment of benefits for services provided during this visit. Patient/Guardian expressed understanding and agreed to proceed.  [name], LCSW, LCAS 09/17/22

## 2022-09-21 ENCOUNTER — Encounter: Payer: Self-pay | Admitting: *Deleted

## 2022-10-05 ENCOUNTER — Other Ambulatory Visit (HOSPITAL_COMMUNITY): Payer: Self-pay | Admitting: Psychiatry

## 2022-10-05 DIAGNOSIS — F3131 Bipolar disorder, current episode depressed, mild: Secondary | ICD-10-CM

## 2022-10-08 ENCOUNTER — Other Ambulatory Visit: Payer: Self-pay | Admitting: Family Medicine

## 2022-10-08 DIAGNOSIS — Z794 Long term (current) use of insulin: Secondary | ICD-10-CM

## 2022-10-09 DIAGNOSIS — M4180 Other forms of scoliosis, site unspecified: Secondary | ICD-10-CM | POA: Diagnosis not present

## 2022-10-09 DIAGNOSIS — M47819 Spondylosis without myelopathy or radiculopathy, site unspecified: Secondary | ICD-10-CM | POA: Diagnosis not present

## 2022-10-09 DIAGNOSIS — M539 Dorsopathy, unspecified: Secondary | ICD-10-CM | POA: Diagnosis not present

## 2022-10-09 DIAGNOSIS — G8929 Other chronic pain: Secondary | ICD-10-CM | POA: Diagnosis not present

## 2022-10-09 DIAGNOSIS — E1165 Type 2 diabetes mellitus with hyperglycemia: Secondary | ICD-10-CM | POA: Diagnosis not present

## 2022-10-09 DIAGNOSIS — G629 Polyneuropathy, unspecified: Secondary | ICD-10-CM | POA: Diagnosis not present

## 2022-10-09 DIAGNOSIS — M5442 Lumbago with sciatica, left side: Secondary | ICD-10-CM | POA: Diagnosis not present

## 2022-10-26 ENCOUNTER — Other Ambulatory Visit (HOSPITAL_COMMUNITY): Payer: Self-pay | Admitting: Psychiatry

## 2022-10-26 DIAGNOSIS — F3131 Bipolar disorder, current episode depressed, mild: Secondary | ICD-10-CM

## 2022-11-06 ENCOUNTER — Telehealth (HOSPITAL_COMMUNITY): Payer: Self-pay

## 2022-11-06 DIAGNOSIS — F3131 Bipolar disorder, current episode depressed, mild: Secondary | ICD-10-CM

## 2022-11-06 MED ORDER — CHLORPROMAZINE HCL 25 MG PO TABS: 75.0000 mg | ORAL_TABLET | Freq: Every day | ORAL | 0 refills | Status: DC

## 2022-11-06 NOTE — Telephone Encounter (Signed)
  Spoke to [facility] at [facility] she stated that the medication does not show any refills please advise   chlorproMAZINE (THORAZINE) 25 MG tablet

## 2022-11-06 NOTE — Telephone Encounter (Signed)
Patient called to request a refill please advise    chlorproMAZINE (THORAZINE) 25 MG tablet 100 tablet 2 08/25/2022    Sig - Route: Take 3-4 tablets (75-100 mg total) by mouth at bedtime. - Oral   Sent to pharmacy as: chlorproMAZINE (THORAZINE) 25 MG tablet   E-Prescribing Status: Receipt confirmed by pharmacy (08/25/2022  3:04 PM EDT)

## 2022-11-06 NOTE — Telephone Encounter (Signed)
She need to check with her pharmacy.  Prescription was sent on August 29 with 2 refills.  She is not due until November 29.

## 2022-11-06 NOTE — Telephone Encounter (Signed)
I called a new prescription.  She should have enough until her next appointment unless she is taking 4 pills a day.  We will address this issue on her next appointment.

## 2022-11-10 ENCOUNTER — Ambulatory Visit (INDEPENDENT_AMBULATORY_CARE_PROVIDER_SITE_OTHER): Payer: Medicare Other | Admitting: Licensed Clinical Social Worker

## 2022-11-10 DIAGNOSIS — F4310 Post-traumatic stress disorder, unspecified: Secondary | ICD-10-CM

## 2022-11-10 DIAGNOSIS — F314 Bipolar disorder, current episode depressed, severe, without psychotic features: Secondary | ICD-10-CM | POA: Diagnosis not present

## 2022-11-10 NOTE — Progress Notes (Signed)
Virtual Visit via Telephone Note   I connected with [name] on 11/10/22 at 2:18pm by telephone and verified that I am speaking with the correct person using two identifiers.   I discussed the limitations, risks, security and privacy concerns of performing an evaluation and management service by telephone and the availability of in person appointments. I also discussed with the patient that there may be a patient responsible charge related to this service. The patient expressed understanding and agreed to proceed.   I discussed the assessment and treatment plan with the patient. The patient was provided an opportunity to ask questions and all were answered. The patient agreed with the plan and demonstrated an understanding of the instructions.   The patient was advised to call back or seek an in-person evaluation if the symptoms worsen or if the condition fails to improve as anticipated.   I provided 35 minutes of non-face-to-face time during this encounter.   [name], LCSW, LCAS ________________________________ THERAPIST PROGRESS NOTE   Session Time: 2:18pm - 2:53pm   Location: Patient: Patient Home Provider: Clinical Home Office    Participation Level: Active   Behavioral Response: Alert, anxious mood   Type of Therapy:  Individual Therapy   Treatment Goals addressed: Depression, panic attack, and anxiety management; Medication compliance    Progress Towards Goals: Progressing   Interventions: CBT, problem solving, communication skills      Summary: Walida Cajas is a 51 year old Caucasian female that presented for therapy appointment today with diagnoses of Bipolar Disorder, current episode depressed, severe w/out psychotic features; and PTSD.         Suicidal/Homicidal: None; without plan or intent.    Therapist Response: Clinician spoke with Lianni for therapy appointment today by phone, as she remains unable to access virtual meetings.  Clinician assessed for safety,  sobriety, and medication compliance.  Debria did not answer initial 3 outreach attempts due to having a block on her phone.  Julaine was finally reached at 2:18pm with assistance from officer, and spoke in a manner that was alert, oriented x5, with no evidence or self-report of active SI/HI or A/V H.  Yolinda reported ongoing compliance with medication and denied any use of alcohol or illicit substances.  Shadana denied experiencing any symptoms of mania.  Clinician inquired about Swannie's emotional ratings today, as well as any significant changes in thoughts, feelings, or behavior since previous check-in. Shianne reported scores of 3/10 for depression, 8/10 for anxiety, 2/10 for irritability, and noted that she has experienced roughly 3 panic attacks over last week.  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  She reported that she also learned that they will be relocating permanently due to his job and she is feeling "Torn up about it".  Rochanda reported that her son has asked her to temporarily live in his home in [place] for 2 years while they get situated, and she is uncertain about this request.  Clinician assisted Dorissa in running a cost benefit analysis regarding whether or not to agree to this request based upon how it would affect overall mental/physical wellbeing.  Lalisa participated in analysis, reporting that although there is more space and security, she will be distanced from key supports, and feel more isolated.  Clinician assisted Britne in exploring potential solutions which could be implemented in order to assist her son while still respecting her own boundaries,  including having the son install a camera to watch the house, and her physically checking on the property x1 every few weeks.  Kalleigh reported that she does want to speak up and assert herself, but is hesitant about how to accomplish  this due to worrying about upsetting her son.  Clinician reviewed assertive communication skills with Olia today to assist, including noting the differences between aggressive, passive, and assertive styles, as well as strategies that could be implemented to ensure success in her approach, such as choosing an appropriate time/setting free of distractions, being mindful of her volume and tone, as well as maintaining eye contact, and seeking compromise if possible.  Interventions were effective, as evidenced by Annetta's active engagement in discussion on topic, reporting that this helped her think more clearly regarding major life decisions she needs to make soon, and reinforced changes in communication that could help her open up clearly, and respectfully about any concerns she might have with key supports in her life.  Kamesha stated "There are pros and cons to everything, and I need to think more about how this will affect me too".  Clinician will continue to monitor.       Plan: Follow up again in 2 weeks virtually.   Diagnosis: Bipolar Disorder, current episode depressed, severe w/out psychotic features; and PTSD.  Collaboration of Care:   No collaboration required at this time.                                                   Patient/Guardian was advised Release of Information must be obtained prior to any record release in order to collaborate their care with an outside provider. Patient/Guardian was advised if they have not already done so to contact the registration department to sign all necessary forms in order for [place] to release information regarding their care.    Consent: Patient/Guardian gives verbal consent for treatment and assignment of benefits for services provided during this visit. Patient/Guardian expressed understanding and agreed to proceed.  [name], [place], LCAS 11/10/22

## 2022-11-13 DIAGNOSIS — E559 Vitamin D deficiency, unspecified: Secondary | ICD-10-CM | POA: Diagnosis not present

## 2022-11-13 DIAGNOSIS — M539 Dorsopathy, unspecified: Secondary | ICD-10-CM | POA: Diagnosis not present

## 2022-11-13 DIAGNOSIS — E7800 Pure hypercholesterolemia, unspecified: Secondary | ICD-10-CM | POA: Diagnosis not present

## 2022-11-13 DIAGNOSIS — E782 Mixed hyperlipidemia: Secondary | ICD-10-CM | POA: Diagnosis not present

## 2022-11-13 DIAGNOSIS — G8929 Other chronic pain: Secondary | ICD-10-CM | POA: Diagnosis not present

## 2022-11-13 DIAGNOSIS — G629 Polyneuropathy, unspecified: Secondary | ICD-10-CM | POA: Diagnosis not present

## 2022-11-13 DIAGNOSIS — M5442 Lumbago with sciatica, left side: Secondary | ICD-10-CM | POA: Diagnosis not present

## 2022-11-13 DIAGNOSIS — E11610 Type 2 diabetes mellitus with diabetic neuropathic arthropathy: Secondary | ICD-10-CM | POA: Diagnosis not present

## 2022-11-13 DIAGNOSIS — M47819 Spondylosis without myelopathy or radiculopathy, site unspecified: Secondary | ICD-10-CM | POA: Diagnosis not present

## 2022-11-13 DIAGNOSIS — Z79899 Other long term (current) drug therapy: Secondary | ICD-10-CM | POA: Diagnosis not present

## 2022-11-13 DIAGNOSIS — E1169 Type 2 diabetes mellitus with other specified complication: Secondary | ICD-10-CM | POA: Diagnosis not present

## 2022-11-18 ENCOUNTER — Telehealth (HOSPITAL_COMMUNITY): Payer: Self-pay | Admitting: *Deleted

## 2022-11-18 NOTE — Telephone Encounter (Signed)
Pt called requesting an early refill of the Klonopin 0.5 mg TID PRN. [facility] [address] confirmed that pt last fill was on 10/25/22. Pt stated that she would run out prior to appointment on 11/24/22. Rx specifically says NO Early Refills. Writer advised pt of this. Pt verbalized understanding.

## 2022-11-18 NOTE — Telephone Encounter (Signed)
No early refill.  

## 2022-11-24 ENCOUNTER — Telehealth (HOSPITAL_BASED_OUTPATIENT_CLINIC_OR_DEPARTMENT_OTHER): Payer: Medicare Other | Admitting: Psychiatry

## 2022-11-24 ENCOUNTER — Encounter (HOSPITAL_COMMUNITY): Payer: Self-pay | Admitting: Psychiatry

## 2022-11-24 DIAGNOSIS — F410 Panic disorder [episodic paroxysmal anxiety] without agoraphobia: Secondary | ICD-10-CM | POA: Diagnosis not present

## 2022-11-24 DIAGNOSIS — F4310 Post-traumatic stress disorder, unspecified: Secondary | ICD-10-CM

## 2022-11-24 DIAGNOSIS — F3131 Bipolar disorder, current episode depressed, mild: Secondary | ICD-10-CM | POA: Diagnosis not present

## 2022-11-24 MED ORDER — LAMOTRIGINE 150 MG PO TABS: 300.0000 mg | ORAL_TABLET | Freq: Every day | ORAL | 0 refills | Status: DC

## 2022-11-24 MED ORDER — CLONAZEPAM 0.5 MG PO TABS: 0.5000 mg | ORAL_TABLET | Freq: Three times a day (TID) | ORAL | 2 refills | Status: DC | PRN

## 2022-11-24 MED ORDER — CHLORPROMAZINE HCL 100 MG PO TABS: 100.0000 mg | ORAL_TABLET | Freq: Every day | ORAL | 0 refills | Status: DC
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
## 2022-11-28 ENCOUNTER — Other Ambulatory Visit (HOSPITAL_COMMUNITY): Payer: Self-pay | Admitting: Psychiatry

## 2022-11-28 DIAGNOSIS — F3131 Bipolar disorder, current episode depressed, mild: Secondary | ICD-10-CM

## 2022-12-01 ENCOUNTER — Ambulatory Visit (INDEPENDENT_AMBULATORY_CARE_PROVIDER_SITE_OTHER): Payer: Medicare Other | Admitting: Licensed Clinical Social Worker

## 2022-12-01 DIAGNOSIS — F4310 Post-traumatic stress disorder, unspecified: Secondary | ICD-10-CM

## 2022-12-01 DIAGNOSIS — F314 Bipolar disorder, current episode depressed, severe, without psychotic features: Secondary | ICD-10-CM | POA: Diagnosis not present

## 2022-12-01 NOTE — Progress Notes (Signed)
Virtual Visit via Telephone Note   I connected with Jodi Wolfe on 12/01/22 at 2:00pm by telephone and verified that I am speaking with the correct person using two identifiers.   I discussed the limitations, risks, security and privacy concerns of performing an evaluation and management service by telephone and the availability of in person appointments. I also discussed with the patient that there may be a patient responsible charge related to this service. The patient expressed understanding and agreed to proceed.   I discussed the assessment and treatment plan with the patient. The patient was provided an opportunity to ask questions and all were answered. The patient agreed with the plan and demonstrated an understanding of the instructions.   The patient was advised to call back or seek an in-person evaluation if the symptoms worsen or if the condition fails to improve as anticipated.   I provided 32 minutes of non-face-to-face time during this encounter.   [name], LCSW, LCAS ________________________________ THERAPIST PROGRESS NOTE   Session Time: 2:00pm - 2:32pm   Location: Patient: Patient Home Provider: [facility] OPT Office   Participation Level: Active   Behavioral Response: Alert, anxious mood   Type of Therapy:  Individual Therapy   Treatment Goals addressed: Depression, panic attack, and anxiety management; Medication compliance; Psychiatry followup; Establishing healthy boundaries    Progress Towards Goals: Progressing   Interventions: CBT, treatment planning    Summary: Jodi Wolfe is a 51 year old Caucasian female that presented for therapy appointment today with diagnoses of Bipolar Disorder, current episode depressed, severe w/out psychotic features; and PTSD.         Suicidal/Homicidal: None; without plan or intent.    Therapist Response: Clinician spoke with Jodi Wolfe for therapy session today by phone, as she remains unable to access virtual meetings.  Clinician  assessed for safety, sobriety, and medication compliance.  Jodi Wolfe answered phone call on time and spoke in a manner that was alert, oriented x5, with no evidence or self-report of active SI/HI or A/V H.  Jodi Wolfe reported ongoing compliance with medication and denied any use of alcohol or illicit substances.  Jodi Wolfe denied experiencing any symptoms of mania.  Clinician inquired about Jodi Wolfe's current emotional ratings, as well as any significant changes in thoughts, feelings, or behavior since last check-in. Jodi Wolfe reported scores of 2/10 for depression, 8/10 for anxiety, 2/10 for anger/irritability, and noted that she has experienced roughly 3 panic attacks this week.  Jodi Wolfe reported that a recent struggle has been preparing for the move into her son's home, stating "I told them that I would do it.  I still have some reservations, and I know its gonna be hard".  She reported that she has also been trying to recover from a cold, so this has slowed down her preparations.  Clinician recommended updating treatment plan today due to recent life changes that Jodi Wolfe has experienced, and she was agreeable to this.  Clinician collaborated with Jodi Wolfe to make updates as follows with her verbal consent:  Meet with clinician virtually once every 2 weeks for therapy to address progress towards goals and any barriers to success; Meet with psychiatrist once every 3 months to address efficacy of medication and make adjustments as needed to regimen and/or dosage; Take medications daily as prescribed to reduce symptoms and improve overall daily functioning; Reduce depression from average severity level of 3/10 down to a 1/10 in the next 90 days by spending 30 minutes per day towards healthy self-care activities; Reduce anxiety from average  severity level of 8/10 down to a 6/10 in next 90 days by utilizing relaxation techniques learned from therapy 2-3 times per day; Reduce panic attacks from x3 per week on average down to x2 within next 90  days by practicing grounding skills at least x2 per day, in addition to reflecting upon triggers afterward that could be influencing these episodes; Improve both mental and physical wellness by checking in with PCP once every 3 months, walk x3 per week, 20 minutes at a time, and following diabetic healthy diet recommended daily; Acquire driver's license by Summer 2024 to aid in reducing reliance on children to get around, as well as open up new opportunities for hobbies/self-care activities to include in daily routine; Improve sleep hygiene by identifying 2-3 effective techniques within next 90 days that can assist with goal of 8 hours uninterrupted sleep nightly; Consider accepting referral for [name] approved therapist for treatment of PTSD symptoms if they continue to negatively impact daily functioning over next 90 days; Attend church service x1 per week in order to stay connected to positive spiritual community and higher power; Establish healthier boundaries within support network by practicing assertive communication skills from therapy in order to reduce associated anxiety and ensure adequate self-care balance.  Progress is evidenced by Jodi Wolfe regularly following up with psychiatrist and PCP, taking medication as prescribed, as well as reporting reduction in depression and panic attack severity.  She reported that she would benefit from meeting more consistently for therapy sessions in order to stay accountable to goals set, get more exercise based upon doctor's recommendation, focus on getting her license to increase independence, attend church more frequently, and advocate for herself to avoid being taken advantage of by family.  Jodi Wolfe ended session early due to cold and needing to rest.  She agreed to outreach PCP if condition doesn't improve over next few days.  Clinician will continue to monitor.       Plan: Follow up again in 2 weeks virtually.   Diagnosis: Bipolar Disorder, current episode depressed,  severe w/out psychotic features; and PTSD.  Collaboration of Care:   No collaboration required at this time.                                                   Patient/Guardian was advised Release of Information must be obtained prior to any record release in order to collaborate their care with an outside provider. Patient/Guardian was advised if they have not already done so to contact the registration department to sign all necessary forms in order for [place] to release information regarding their care.    Consent: Patient/Guardian gives verbal consent for treatment and assignment of benefits for services provided during this visit. Patient/Guardian expressed understanding and agreed to proceed.  [name], [place], LCAS 12/01/22

## 2022-12-10 ENCOUNTER — Encounter: Payer: Self-pay | Admitting: *Deleted

## 2022-12-11 DIAGNOSIS — M5442 Lumbago with sciatica, left side: Secondary | ICD-10-CM | POA: Diagnosis not present

## 2022-12-11 DIAGNOSIS — M539 Dorsopathy, unspecified: Secondary | ICD-10-CM | POA: Diagnosis not present

## 2022-12-11 DIAGNOSIS — R030 Elevated blood-pressure reading, without diagnosis of hypertension: Secondary | ICD-10-CM | POA: Diagnosis not present

## 2022-12-11 DIAGNOSIS — M47819 Spondylosis without myelopathy or radiculopathy, site unspecified: Secondary | ICD-10-CM | POA: Diagnosis not present

## 2022-12-11 DIAGNOSIS — E11610 Type 2 diabetes mellitus with diabetic neuropathic arthropathy: Secondary | ICD-10-CM | POA: Diagnosis not present

## 2022-12-11 DIAGNOSIS — R112 Nausea with vomiting, unspecified: Secondary | ICD-10-CM | POA: Diagnosis not present

## 2022-12-11 DIAGNOSIS — G629 Polyneuropathy, unspecified: Secondary | ICD-10-CM | POA: Diagnosis not present

## 2022-12-15 ENCOUNTER — Ambulatory Visit (INDEPENDENT_AMBULATORY_CARE_PROVIDER_SITE_OTHER): Payer: Medicare Other | Admitting: Licensed Clinical Social Worker

## 2022-12-15 DIAGNOSIS — F314 Bipolar disorder, current episode depressed, severe, without psychotic features: Secondary | ICD-10-CM

## 2022-12-15 DIAGNOSIS — F4310 Post-traumatic stress disorder, unspecified: Secondary | ICD-10-CM | POA: Diagnosis not present

## 2022-12-15 NOTE — Progress Notes (Signed)
Virtual Visit via Telephone Note   I connected with Jodi Wolfe on 12/15/22 at 2:00pm by telephone and verified that I am speaking with the correct person using two identifiers.   I discussed the limitations, risks, security and privacy concerns of performing an evaluation and management service by telephone and the availability of in person appointments. I also discussed with the patient that there may be a patient responsible charge related to this service. The patient expressed understanding and agreed to proceed.   I discussed the assessment and treatment plan with the patient. The patient was provided an opportunity to ask questions and all were answered. The patient agreed with the plan and demonstrated an understanding of the instructions.   The patient was advised to call back or seek an in-person evaluation if the symptoms worsen or if the condition fails to improve as anticipated.   I provided 41 minutes of non-face-to-face time during this encounter.   Jodi Stain, LCSW, LCAS ________________________________ THERAPIST PROGRESS NOTE   Session Time: 2:00pm - 2:41pm   Location: Patient: Patient Home Provider: [facility] OPT Office   Participation Level: Active   Behavioral Response: Alert, anxious mood   Type of Therapy:  Individual Therapy   Treatment Goals addressed: Depression, panic attack, and anxiety management; Medication compliance; Pursuing driver's license; Maintaining church attendance    Progress Towards Goals: Progressing   Interventions: CBT, problem solving, referrals for group support    Summary: Jodi Wolfe is a 51 year old Caucasian female that presented for therapy appointment today with diagnoses of Bipolar Disorder, current episode depressed, severe w/out psychotic features; and PTSD.         Suicidal/Homicidal: None; without plan or intent.    Therapist Response: Clinician spoke with Jodi Wolfe for therapy appointment today by phone, as she remains unable to  access virtual meetings.  Clinician assessed for safety, sobriety, and medication compliance.  Jodi Wolfe answered phone call on time and spoke in a manner that was alert, oriented x5, with no evidence or self-report of active SI/HI or A/V H.  Jodi Wolfe reported ongoing compliance with medication and denied any use of alcohol or illicit substances.  Jodi Wolfe denied experiencing any symptoms of mania.  Clinician inquired about Jodi Wolfe's emotional ratings today, as well as any significant changes in thoughts, feelings, or behavior since previous check-in. Jodi Wolfe reported scores of 2/10 for depression, 8/10 for anxiety, 2/10 for irritability, and noted that she has experienced roughly 4 panic attacks this week.  Jodi Wolfe reported that a recent struggle has been preparing for her move to [name], [name].  She reported that she told her church about this move over the weekend, and worries about lack of support [place].  Clinician assisted Jodi Wolfe in running cost benefit analysis regarding whether to seek transition to a church that will be closer to the new home and offer similar spiritual/community support.  Jodi Wolfe participated in analysis and agreed that it will be important for her to stay engaged with some form of spiritual outlet, although online search of the area with clinician showed few options that align with her denomination.  She reported that she will be able to attend virtual sermons in the meantime, and the issue with location increased overall motivation towards acquiring her license, although she wasn't sure how to get started, stating "Its all so overwhelming".  Clinician encouraged Jodi Wolfe to use SMART goal setting process to accomplish this task, with first step to begin studying ahead of next  scheduled test via online materials available from the [facility] website when she has free time following move.  Jodi Wolfe was agreeable to this, reporting that she hopes being able to drive will help her expand  support network too.  Clinician informed Jodi Wolfe of availability of online support groups she may find helpful for skills building and expanding support, with referral to [facility] offered.  Jodi Wolfe was receptive to this referral and reported that some of the online groups mentioned were appealing, so she took down their number to call this week.  Jodi Wolfe stated "I've felt like everything that has been going on was sending me on a downward spiral, but talking about things today gives me a little hope for the future".  Clinician will continue to monitor.       Plan: Follow up again in 2 weeks virtually.   Diagnosis: Bipolar Disorder, current episode depressed, severe w/out psychotic features; and PTSD.  Collaboration of Care:   No collaboration required at this time.                                                   Patient/Guardian was advised Release of Information must be obtained prior to any record release in order to collaborate their care with an outside provider. Patient/Guardian was advised if they have not already done so to contact the registration department to sign all necessary forms in order for [place] to release information regarding their care.    Consent: Patient/Guardian gives verbal consent for treatment and assignment of benefits for services provided during this visit. Patient/Guardian expressed understanding and agreed to proceed.  Jodi Wolfe, [place], LCAS 12/15/22

## 2023-01-15 DIAGNOSIS — E559 Vitamin D deficiency, unspecified: Secondary | ICD-10-CM | POA: Diagnosis not present

## 2023-01-15 DIAGNOSIS — Z0000 Encounter for general adult medical examination without abnormal findings: Secondary | ICD-10-CM | POA: Diagnosis not present

## 2023-01-15 DIAGNOSIS — M539 Dorsopathy, unspecified: Secondary | ICD-10-CM | POA: Diagnosis not present

## 2023-01-15 DIAGNOSIS — E11610 Type 2 diabetes mellitus with diabetic neuropathic arthropathy: Secondary | ICD-10-CM | POA: Diagnosis not present

## 2023-01-15 DIAGNOSIS — R0602 Shortness of breath: Secondary | ICD-10-CM | POA: Diagnosis not present

## 2023-01-15 DIAGNOSIS — R5383 Other fatigue: Secondary | ICD-10-CM | POA: Diagnosis not present

## 2023-01-15 DIAGNOSIS — Z780 Asymptomatic menopausal state: Secondary | ICD-10-CM | POA: Diagnosis not present

## 2023-01-15 DIAGNOSIS — E1169 Type 2 diabetes mellitus with other specified complication: Secondary | ICD-10-CM | POA: Diagnosis not present

## 2023-01-15 DIAGNOSIS — E782 Mixed hyperlipidemia: Secondary | ICD-10-CM | POA: Diagnosis not present

## 2023-01-21 ENCOUNTER — Other Ambulatory Visit (HOSPITAL_COMMUNITY): Payer: Self-pay | Admitting: *Deleted

## 2023-01-21 MED ORDER — CLONAZEPAM 0.5 MG PO TABS: 0.5000 mg | ORAL_TABLET | Freq: Three times a day (TID) | ORAL | 0 refills | Status: DC | PRN

## 2023-01-25 ENCOUNTER — Ambulatory Visit (HOSPITAL_COMMUNITY): Payer: 59 | Admitting: Licensed Clinical Social Worker

## 2023-01-25 ENCOUNTER — Telehealth (HOSPITAL_COMMUNITY): Payer: Self-pay | Admitting: Licensed Clinical Social Worker

## 2023-01-25 NOTE — Telephone Encounter (Signed)
Jodi Wolfe had a virtual therapy appointment scheduled today at Why did not present on time for appointment despite email and text invites sent at start of meeting.  Clinician attempted phone outreach through [employer] application x3, but received no response, and a voicemail could not be left through her phone provider.  Clinician informed front desk staff of no show event, and was informed of similar issues with their outreach attempt.  Clinician ended virtual session at 1:23pm.    Shade Flood, LCSW, LCAS 01/25/23

## 2023-02-04 ENCOUNTER — Other Ambulatory Visit (HOSPITAL_COMMUNITY): Payer: Self-pay | Admitting: *Deleted

## 2023-02-04 DIAGNOSIS — F3131 Bipolar disorder, current episode depressed, mild: Secondary | ICD-10-CM

## 2023-02-04 MED ORDER — CHLORPROMAZINE HCL 100 MG PO TABS: 100.0000 mg | ORAL_TABLET | Freq: Every day | ORAL | 0 refills | Status: DC

## 2023-02-12 DIAGNOSIS — M539 Dorsopathy, unspecified: Secondary | ICD-10-CM | POA: Diagnosis not present

## 2023-02-12 DIAGNOSIS — M47819 Spondylosis without myelopathy or radiculopathy, site unspecified: Secondary | ICD-10-CM | POA: Diagnosis not present

## 2023-02-12 DIAGNOSIS — M5442 Lumbago with sciatica, left side: Secondary | ICD-10-CM | POA: Diagnosis not present

## 2023-02-12 DIAGNOSIS — G8929 Other chronic pain: Secondary | ICD-10-CM | POA: Diagnosis not present

## 2023-02-12 DIAGNOSIS — G629 Polyneuropathy, unspecified: Secondary | ICD-10-CM | POA: Diagnosis not present

## 2023-02-12 DIAGNOSIS — Z79899 Other long term (current) drug therapy: Secondary | ICD-10-CM | POA: Diagnosis not present

## 2023-02-15 ENCOUNTER — Telehealth (HOSPITAL_COMMUNITY): Payer: Medicare Other | Admitting: Psychiatry

## 2023-02-17 ENCOUNTER — Encounter (HOSPITAL_COMMUNITY): Payer: Self-pay | Admitting: Psychiatry

## 2023-02-17 ENCOUNTER — Telehealth (HOSPITAL_BASED_OUTPATIENT_CLINIC_OR_DEPARTMENT_OTHER): Payer: 59 | Admitting: Psychiatry

## 2023-02-17 VITALS — Wt 209.0 lb

## 2023-02-17 DIAGNOSIS — F4310 Post-traumatic stress disorder, unspecified: Secondary | ICD-10-CM

## 2023-02-17 DIAGNOSIS — F410 Panic disorder [episodic paroxysmal anxiety] without agoraphobia: Secondary | ICD-10-CM

## 2023-02-17 DIAGNOSIS — F3131 Bipolar disorder, current episode depressed, mild: Secondary | ICD-10-CM | POA: Diagnosis not present

## 2023-02-17 DIAGNOSIS — Z79899 Other long term (current) drug therapy: Secondary | ICD-10-CM | POA: Diagnosis not present

## 2023-02-17 MED ORDER — CHLORPROMAZINE HCL 100 MG PO TABS: 100.0000 mg | ORAL_TABLET | Freq: Every day | ORAL | 2 refills | Status: DC

## 2023-02-17 MED ORDER — LAMOTRIGINE 150 MG PO TABS: 300.0000 mg | ORAL_TABLET | Freq: Every day | ORAL | 0 refills | Status: DC

## 2023-02-17 MED ORDER — CLONAZEPAM 0.5 MG PO TABS: 0.5000 mg | ORAL_TABLET | Freq: Three times a day (TID) | ORAL | 2 refills | Status: DC | PRN
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
## 2023-02-21 ENCOUNTER — Other Ambulatory Visit (HOSPITAL_COMMUNITY): Payer: Self-pay | Admitting: Psychiatry

## 2023-02-21 DIAGNOSIS — F3131 Bipolar disorder, current episode depressed, mild: Secondary | ICD-10-CM

## 2023-02-23 ENCOUNTER — Telehealth (HOSPITAL_COMMUNITY): Payer: Medicare Other | Admitting: Psychiatry

## 2023-03-12 DIAGNOSIS — M47819 Spondylosis without myelopathy or radiculopathy, site unspecified: Secondary | ICD-10-CM | POA: Diagnosis not present

## 2023-03-12 DIAGNOSIS — G629 Polyneuropathy, unspecified: Secondary | ICD-10-CM | POA: Diagnosis not present

## 2023-03-12 DIAGNOSIS — E7800 Pure hypercholesterolemia, unspecified: Secondary | ICD-10-CM | POA: Diagnosis not present

## 2023-03-12 DIAGNOSIS — E11610 Type 2 diabetes mellitus with diabetic neuropathic arthropathy: Secondary | ICD-10-CM | POA: Diagnosis not present

## 2023-03-12 DIAGNOSIS — M539 Dorsopathy, unspecified: Secondary | ICD-10-CM | POA: Diagnosis not present

## 2023-03-12 DIAGNOSIS — G8929 Other chronic pain: Secondary | ICD-10-CM | POA: Diagnosis not present

## 2023-03-12 DIAGNOSIS — M5442 Lumbago with sciatica, left side: Secondary | ICD-10-CM | POA: Diagnosis not present

## 2023-03-12 DIAGNOSIS — Z79899 Other long term (current) drug therapy: Secondary | ICD-10-CM | POA: Diagnosis not present

## 2023-03-12 DIAGNOSIS — E559 Vitamin D deficiency, unspecified: Secondary | ICD-10-CM | POA: Diagnosis not present

## 2023-03-13 ENCOUNTER — Other Ambulatory Visit (HOSPITAL_COMMUNITY): Payer: Self-pay | Admitting: Psychiatry

## 2023-03-13 DIAGNOSIS — F3131 Bipolar disorder, current episode depressed, mild: Secondary | ICD-10-CM

## 2023-04-09 ENCOUNTER — Other Ambulatory Visit (HOSPITAL_COMMUNITY): Payer: Self-pay | Admitting: Psychiatry

## 2023-04-09 DIAGNOSIS — M47819 Spondylosis without myelopathy or radiculopathy, site unspecified: Secondary | ICD-10-CM | POA: Diagnosis not present

## 2023-04-09 DIAGNOSIS — G8929 Other chronic pain: Secondary | ICD-10-CM | POA: Diagnosis not present

## 2023-04-09 DIAGNOSIS — E11610 Type 2 diabetes mellitus with diabetic neuropathic arthropathy: Secondary | ICD-10-CM | POA: Diagnosis not present

## 2023-04-09 DIAGNOSIS — M5442 Lumbago with sciatica, left side: Secondary | ICD-10-CM | POA: Diagnosis not present

## 2023-04-09 DIAGNOSIS — F3131 Bipolar disorder, current episode depressed, mild: Secondary | ICD-10-CM

## 2023-04-09 DIAGNOSIS — G629 Polyneuropathy, unspecified: Secondary | ICD-10-CM | POA: Diagnosis not present

## 2023-04-09 DIAGNOSIS — Z1211 Encounter for screening for malignant neoplasm of colon: Secondary | ICD-10-CM | POA: Diagnosis not present

## 2023-04-09 DIAGNOSIS — Z1231 Encounter for screening mammogram for malignant neoplasm of breast: Secondary | ICD-10-CM | POA: Diagnosis not present

## 2023-04-09 DIAGNOSIS — R112 Nausea with vomiting, unspecified: Secondary | ICD-10-CM | POA: Diagnosis not present

## 2023-04-09 DIAGNOSIS — Z79899 Other long term (current) drug therapy: Secondary | ICD-10-CM | POA: Diagnosis not present

## 2023-04-09 DIAGNOSIS — Z780 Asymptomatic menopausal state: Secondary | ICD-10-CM | POA: Diagnosis not present

## 2023-05-07 ENCOUNTER — Other Ambulatory Visit (HOSPITAL_COMMUNITY): Payer: Self-pay | Admitting: Psychiatry

## 2023-05-07 DIAGNOSIS — F3131 Bipolar disorder, current episode depressed, mild: Secondary | ICD-10-CM

## 2023-05-11 DIAGNOSIS — E11610 Type 2 diabetes mellitus with diabetic neuropathic arthropathy: Secondary | ICD-10-CM | POA: Diagnosis not present

## 2023-05-11 DIAGNOSIS — M47819 Spondylosis without myelopathy or radiculopathy, site unspecified: Secondary | ICD-10-CM | POA: Diagnosis not present

## 2023-05-11 DIAGNOSIS — M5442 Lumbago with sciatica, left side: Secondary | ICD-10-CM | POA: Diagnosis not present

## 2023-05-11 DIAGNOSIS — G8929 Other chronic pain: Secondary | ICD-10-CM | POA: Diagnosis not present

## 2023-05-12 ENCOUNTER — Other Ambulatory Visit (HOSPITAL_COMMUNITY): Payer: Self-pay | Admitting: *Deleted

## 2023-05-12 ENCOUNTER — Ambulatory Visit (INDEPENDENT_AMBULATORY_CARE_PROVIDER_SITE_OTHER): Payer: 59 | Admitting: Licensed Clinical Social Worker

## 2023-05-12 DIAGNOSIS — F3131 Bipolar disorder, current episode depressed, mild: Secondary | ICD-10-CM

## 2023-05-12 DIAGNOSIS — F4310 Post-traumatic stress disorder, unspecified: Secondary | ICD-10-CM | POA: Diagnosis not present

## 2023-05-12 DIAGNOSIS — F314 Bipolar disorder, current episode depressed, severe, without psychotic features: Secondary | ICD-10-CM | POA: Diagnosis not present

## 2023-05-12 MED ORDER — CHLORPROMAZINE HCL 100 MG PO TABS: 100.0000 mg | ORAL_TABLET | Freq: Every day | ORAL | 0 refills | Status: DC

## 2023-05-12 NOTE — Progress Notes (Signed)
Virtual Visit via Video Note   I connected with Jodi Wolfe on 05/12/23 at 2:00pm by video enabled telemedicine application and verified that I am speaking with the correct person using two identifiers.   I discussed the limitations, risks, security and privacy concerns of performing an evaluation and management service by video and the availability of in person appointments. I also discussed with the patient that there may be a patient responsible charge related to this service. The patient expressed understanding and agreed to proceed.   I discussed the assessment and treatment plan with the patient. The patient was provided an opportunity to ask questions and all were answered. The patient agreed with the plan and demonstrated an understanding of the instructions.   The patient was advised to call back or seek an in-person evaluation if the symptoms worsen or if the condition fails to improve as anticipated.   I provided 1 hour of non-face-to-face time during this encounter.   Jodi Stain, LCSW, LCAS ________________________________ THERAPIST PROGRESS NOTE   Session Time: 2:00pm - 3:00pm      Location: Patient: Patient Home Provider: [facility] OPT Office   Participation Level: Active   Behavioral Response: Alert, casually dressed, anxious mood/affect      Type of Therapy:  Individual Therapy   Treatment Goals addressed: Depression, panic attack, and anxiety management; Medication compliance; Establishing healthier boundaries with support network      Progress Towards Goals: Progressing     Interventions: CBT, conflict resolution skills       Summary: Jodi Wolfe is a 52 year old Caucasian female that presented for therapy appointment today with diagnoses of Bipolar Disorder, current episode depressed, severe w/out psychotic features; and PTSD.         Suicidal/Homicidal: None; without plan or intent.    Therapist Response: Clinician met with Jodi Wolfe for virtual therapy session today and  assessed for safety, sobriety, and medication compliance.  Jodi Wolfe presented for appointment on time and was alert, oriented x5, with no evidence or self-report of active SI/HI or A/V H.  Jodi Wolfe reported ongoing compliance with medication and denied any use of alcohol or illicit substances.  Jodi Wolfe denied experiencing any symptoms of mania.  Clinician inquired about Jodi Wolfe's current emotional ratings, as well as any significant changes in thoughts, feelings, or behavior since last check-in. Jodi Wolfe reported scores of 2/10 for depression, 7/10 for anxiety, 1/10 for irritability, and noted that she has experienced roughly 5 panic attacks this week.  Jodi Wolfe reported that a major struggle has been adapting to life in [place] since moving into her son's house to help out.  Jodi Wolfe reported that this was complicated recently when her 72 year old granddaughter asked her about past trauma that Jodi Wolfe had been through, and Jodi Wolfe did not know how to respond.  Jodi Wolfe reported that this event being brought up has led panic attacks to increase, as well as their severity, stating "I'm having a hard time with that.  I can't believe my son told her about that and I don't know how to address it".  Clinician covered topic of conflict resolution today and virtually shared a handout on subject with Jodi Wolfe which warned against the 'four horsemen' of communication traps that should be avoided due to tendency to escalate and damage a relationship.  These included criticism, defensiveness, contempt, and stonewalling.  'Antidotes' to these harmful behaviors were offered as healthy replacements to improve communication and understanding, including approaching problems with a gentle startup approach, taking responsibility for one's behavior,  sharing fondness/admiration, and using self-soothing to calm down and focus on the problem at hand.  Clinician encouraged Jodi Wolfe to apply this material to recent experiences with conflict that she has faced, consider  which approach she utilized, and any changes that she could plan to implement in order to improve overall conflict resolution skills.  Intervention was effective, as evidenced by Jodi Wolfe actively participating in discussion on topic, reporting that she has engaged in some of these communication traps before, including defensiveness and stonewalling.  Jodi Wolfe reported that this has led to consequences such as worsening depression and anxiety, and feeling more overwhelmed as relationships with certain family have deteriorated.  Jodi Wolfe expressed receptiveness to alternative strategies offered in order to improve conflict resolution skills, including practicing "I" statements in order to express her thoughts and feelings in a direct, but respectful way, taking responsibility for any mistakes she makes, and using coping skills learned from therapy to calm down when these interactions overwhelm her.  Clinician will continue to monitor.       Plan: Follow up again in 2 weeks virtually.   Diagnosis: Bipolar Disorder, current episode depressed, severe w/out psychotic features; and PTSD.  Collaboration of Care:   No collaboration required at this time.                                                   Patient/Guardian was advised Release of Information must be obtained prior to any record release in order to collaborate their care with an outside provider. Patient/Guardian was advised if they have not already done so to contact the registration department to sign all necessary forms in order for [place] to release information regarding their care.    Consent: Patient/Guardian gives verbal consent for treatment and assignment of benefits for services provided during this visit. Patient/Guardian expressed understanding and agreed to proceed.  Jodi Wolfe, [place], LCAS 05/12/23

## 2023-05-13 DIAGNOSIS — Z79899 Other long term (current) drug therapy: Secondary | ICD-10-CM | POA: Diagnosis not present

## 2023-05-19 ENCOUNTER — Encounter (HOSPITAL_COMMUNITY): Payer: Self-pay | Admitting: Psychiatry

## 2023-05-19 ENCOUNTER — Telehealth (HOSPITAL_BASED_OUTPATIENT_CLINIC_OR_DEPARTMENT_OTHER): Payer: 59 | Admitting: Psychiatry

## 2023-05-19 VITALS — Wt 200.0 lb

## 2023-05-19 DIAGNOSIS — F4310 Post-traumatic stress disorder, unspecified: Secondary | ICD-10-CM | POA: Diagnosis not present

## 2023-05-19 DIAGNOSIS — F410 Panic disorder [episodic paroxysmal anxiety] without agoraphobia: Secondary | ICD-10-CM | POA: Diagnosis not present

## 2023-05-19 DIAGNOSIS — F3131 Bipolar disorder, current episode depressed, mild: Secondary | ICD-10-CM | POA: Diagnosis not present

## 2023-05-19 MED ORDER — CLONAZEPAM 0.5 MG PO TABS: 0.5000 mg | ORAL_TABLET | Freq: Three times a day (TID) | ORAL | 2 refills | Status: DC | PRN

## 2023-05-19 MED ORDER — LAMOTRIGINE 150 MG PO TABS: 300.0000 mg | ORAL_TABLET | Freq: Every day | ORAL | 0 refills | Status: DC

## 2023-05-19 MED ORDER — CHLORPROMAZINE HCL 100 MG PO TABS: 100.0000 mg | ORAL_TABLET | Freq: Every day | ORAL | 2 refills | Status: DC
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
## 2023-06-18 DIAGNOSIS — M47819 Spondylosis without myelopathy or radiculopathy, site unspecified: Secondary | ICD-10-CM | POA: Diagnosis not present

## 2023-06-18 DIAGNOSIS — G8929 Other chronic pain: Secondary | ICD-10-CM | POA: Diagnosis not present

## 2023-06-18 DIAGNOSIS — M5442 Lumbago with sciatica, left side: Secondary | ICD-10-CM | POA: Diagnosis not present

## 2023-07-05 ENCOUNTER — Ambulatory Visit (INDEPENDENT_AMBULATORY_CARE_PROVIDER_SITE_OTHER): Payer: 59 | Admitting: Licensed Clinical Social Worker

## 2023-07-05 DIAGNOSIS — F4310 Post-traumatic stress disorder, unspecified: Secondary | ICD-10-CM

## 2023-07-05 DIAGNOSIS — F314 Bipolar disorder, current episode depressed, severe, without psychotic features: Secondary | ICD-10-CM
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
## 2023-07-15 ENCOUNTER — Telehealth (HOSPITAL_COMMUNITY): Payer: Self-pay | Admitting: *Deleted

## 2023-07-15 NOTE — Telephone Encounter (Signed)
Thanks for the update

## 2023-07-15 NOTE — Telephone Encounter (Signed)
Writer spoke with Pharmacist Mia who called to advise that pt has been receiving Gabapentin and Hydrocodone from Dr. Knox Royalty @ [facility] on [address] PhD wanted to inform that that prescriber will have to acknowledge that he is aware of CNS risks on any further prescriptions. FYI.

## 2023-07-16 DIAGNOSIS — G8929 Other chronic pain: Secondary | ICD-10-CM | POA: Diagnosis not present

## 2023-07-16 DIAGNOSIS — Z79899 Other long term (current) drug therapy: Secondary | ICD-10-CM | POA: Diagnosis not present

## 2023-07-16 DIAGNOSIS — E11610 Type 2 diabetes mellitus with diabetic neuropathic arthropathy: Secondary | ICD-10-CM | POA: Diagnosis not present

## 2023-07-16 DIAGNOSIS — E782 Mixed hyperlipidemia: Secondary | ICD-10-CM | POA: Diagnosis not present

## 2023-07-16 DIAGNOSIS — M5442 Lumbago with sciatica, left side: Secondary | ICD-10-CM | POA: Diagnosis not present

## 2023-07-16 DIAGNOSIS — E559 Vitamin D deficiency, unspecified: Secondary | ICD-10-CM | POA: Diagnosis not present

## 2023-07-16 DIAGNOSIS — E1169 Type 2 diabetes mellitus with other specified complication: Secondary | ICD-10-CM | POA: Diagnosis not present

## 2023-07-28 ENCOUNTER — Ambulatory Visit (INDEPENDENT_AMBULATORY_CARE_PROVIDER_SITE_OTHER): Payer: 59 | Admitting: Licensed Clinical Social Worker

## 2023-07-28 DIAGNOSIS — F314 Bipolar disorder, current episode depressed, severe, without psychotic features: Secondary | ICD-10-CM | POA: Diagnosis not present

## 2023-07-28 DIAGNOSIS — F4310 Post-traumatic stress disorder, unspecified: Secondary | ICD-10-CM

## 2023-07-28 NOTE — Progress Notes (Signed)
Virtual Visit via Video Note   I connected with Jodi Wolfe on 07/28/23 at 1:00pm by video enabled telemedicine application and verified that I am speaking with the correct person using two identifiers.   I discussed the limitations, risks, security and privacy concerns of performing an evaluation and management service by video and the availability of in person appointments. I also discussed with the patient that there may be a patient responsible charge related to this service. The patient expressed understanding and agreed to proceed.   I discussed the assessment and treatment plan with the patient. The patient was provided an opportunity to ask questions and all were answered. The patient agreed with the plan and demonstrated an understanding of the instructions.   The patient was advised to call back or seek an in-person evaluation if the symptoms worsen or if the condition fails to improve as anticipated.   I provided 1 hour of non-face-to-face time during this encounter.   Jodi Stain, LCSW, LCAS ________________________________ THERAPIST PROGRESS NOTE   Session Time: 1:00pm - 2:00pm      Location: Patient: Patient Home Provider: [facility] OPT Office   Participation Level: Active   Behavioral Response: Alert, casually dressed, anxious mood/affect    Type of Therapy:  Individual Therapy   Treatment Goals addressed: Depression, panic attack, and anxiety management; Medication compliance; Establishing healthier boundaries with support network      Progress Towards Goals: Progressing     Interventions: CBT, communication skills       Summary: Jodi Wolfe is a 52 year old Caucasian female that presented for therapy appointment today with diagnoses of Bipolar Disorder, current episode depressed, severe w/out psychotic features; and PTSD.         Suicidal/Homicidal: None; without plan or intent.    Therapist Response: Clinician met with Jodi Wolfe for virtual therapy appointment today and  assessed for safety, sobriety, and medication compliance.  Jodi Wolfe presented for session on time and was alert, oriented x5, with no evidence or self-report of active SI/HI or A/V H.  Jodi Wol[place] reported ongoing compliance with medication and denied any use of alcohol or illicit substances.  Jodi Wolfe denied experiencing any symptoms of mania.  Clinician inquired about [place]odi [place]'s emotional ratings today, as well as any significant changes in thoughts, feelings, or behavior since previous check-in. Jodi Wolfe reported scores of 2/10 for depression, 7/10 for anxiety, 2/10 for irritability, and noted that she has experienced roughly 4 panic attacks this week.  Jodi Wolfe reported that a recent success was getting to spend time with her grandkids over the past week, stating "That was good, but hard.  It rained a lot so we did crafts and watched TV".  She reported that a struggle was outreaching her eldest son and receiving no response for days.  [place] stated "That hurts.  He eventually called me back but he was really short".  She reported that the other son whose home she is watching can have unrealistic expectations for her at times, but she hasn't addressed this problem with him either.  Clinician covered material with Jodi Wolfe today on communication skills which could be utilized to increase understanding and support within the family.  Clinician presented a handout on 'soft startups' which offered suggestions on how Jodi [place] could address problems assertively with her sons, including tips such as choosing an appropriate time/setting, being mindful of maintaining gentle tone, volume and language, while avoiding triggering nonverbals such as rolling eyes, as well as utilizing "I" statements to express feelings, focusing on one problem  at a time, and being respectful.  Intervention was effective, as evidenced by Jodi Wolfe's active engagement in discussion on subject, and receptiveness to suggestions offered in improving communication and avoiding  outbursts towards family, stating "The way I used to be, I would have gone off by now.  I don't want to be like that anymore. I need to say something because I can feel things building up though".  Clinician will continue to monitor.       Plan: Follow up again in 2 weeks virtually.   Diagnosis: Bipolar Disorder, current episode depressed, severe w/out psychotic features; and PTSD.  Collaboration of Care:   No collaboration required at this time.                                                   Patient/Guardian was advised Release of Information must be obtained prior to any record release in order to collaborate their care with an outside provider. Patient/Guardian was advised if they have not already done so to contact the registration department to sign all necessary forms in order for [place] to release information regarding their care.    Consent: Patient/Guardian gives verbal consent for treatment and assignment of benefits for services provided during this visit. Patient/Guardian expressed understanding and agreed to proceed.  [place], [place], LCAS 07/28/23

## 2023-08-06 ENCOUNTER — Other Ambulatory Visit (HOSPITAL_COMMUNITY): Payer: Self-pay | Admitting: Psychiatry

## 2023-08-06 DIAGNOSIS — F3131 Bipolar disorder, current episode depressed, mild: Secondary | ICD-10-CM

## 2023-08-11 DIAGNOSIS — M5442 Lumbago with sciatica, left side: Secondary | ICD-10-CM | POA: Diagnosis not present

## 2023-08-11 DIAGNOSIS — M47819 Spondylosis without myelopathy or radiculopathy, site unspecified: Secondary | ICD-10-CM | POA: Diagnosis not present

## 2023-08-11 DIAGNOSIS — Z79899 Other long term (current) drug therapy: Secondary | ICD-10-CM | POA: Diagnosis not present

## 2023-08-11 DIAGNOSIS — Z1231 Encounter for screening mammogram for malignant neoplasm of breast: Secondary | ICD-10-CM | POA: Diagnosis not present

## 2023-08-11 DIAGNOSIS — R112 Nausea with vomiting, unspecified: Secondary | ICD-10-CM | POA: Diagnosis not present

## 2023-08-11 DIAGNOSIS — G8929 Other chronic pain: Secondary | ICD-10-CM | POA: Diagnosis not present

## 2023-08-16 ENCOUNTER — Other Ambulatory Visit (HOSPITAL_COMMUNITY): Payer: Self-pay

## 2023-08-16 DIAGNOSIS — F3131 Bipolar disorder, current episode depressed, mild: Secondary | ICD-10-CM

## 2023-08-16 MED ORDER — CHLORPROMAZINE HCL 100 MG PO TABS
100.0000 mg | ORAL_TABLET | Freq: Every day | ORAL | 0 refills | Status: DC
Start: 2023-08-16 — End: 2023-08-23

## 2023-08-19 ENCOUNTER — Other Ambulatory Visit (HOSPITAL_COMMUNITY): Payer: Self-pay | Admitting: Psychiatry

## 2023-08-19 DIAGNOSIS — F4310 Post-traumatic stress disorder, unspecified: Secondary | ICD-10-CM

## 2023-08-19 DIAGNOSIS — F410 Panic disorder [episodic paroxysmal anxiety] without agoraphobia: Secondary | ICD-10-CM

## 2023-08-20 ENCOUNTER — Other Ambulatory Visit (HOSPITAL_COMMUNITY): Payer: Self-pay | Admitting: *Deleted

## 2023-08-20 DIAGNOSIS — F410 Panic disorder [episodic paroxysmal anxiety] without agoraphobia: Secondary | ICD-10-CM

## 2023-08-20 DIAGNOSIS — F4310 Post-traumatic stress disorder, unspecified: Secondary | ICD-10-CM

## 2023-08-20 MED ORDER — CLONAZEPAM 0.5 MG PO TABS
0.5000 mg | ORAL_TABLET | Freq: Three times a day (TID) | ORAL | 0 refills | Status: DC | PRN
Start: 2023-08-20 — End: 2023-08-23

## 2023-08-23 ENCOUNTER — Telehealth (HOSPITAL_BASED_OUTPATIENT_CLINIC_OR_DEPARTMENT_OTHER): Payer: 59 | Admitting: Psychiatry

## 2023-08-23 ENCOUNTER — Encounter (HOSPITAL_COMMUNITY): Payer: Self-pay | Admitting: Psychiatry

## 2023-08-23 VITALS — Wt 200.0 lb

## 2023-08-23 DIAGNOSIS — F3131 Bipolar disorder, current episode depressed, mild: Secondary | ICD-10-CM | POA: Diagnosis not present

## 2023-08-23 DIAGNOSIS — F410 Panic disorder [episodic paroxysmal anxiety] without agoraphobia: Secondary | ICD-10-CM

## 2023-08-23 DIAGNOSIS — F4310 Post-traumatic stress disorder, unspecified: Secondary | ICD-10-CM

## 2023-08-23 MED ORDER — CHLORPROMAZINE HCL 100 MG PO TABS
100.0000 mg | ORAL_TABLET | Freq: Every day | ORAL | 0 refills | Status: DC
Start: 2023-08-23 — End: 2023-11-08

## 2023-08-23 MED ORDER — LAMOTRIGINE 150 MG PO TABS
300.0000 mg | ORAL_TABLET | Freq: Every day | ORAL | 0 refills | Status: DC
Start: 2023-08-23 — End: 2023-11-08

## 2023-08-23 MED ORDER — CLONAZEPAM 0.5 MG PO TABS
0.5000 mg | ORAL_TABLET | Freq: Two times a day (BID) | ORAL | 2 refills | Status: DC
Start: 2023-08-23 — End: 2023-11-08
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
## 2023-08-30 ENCOUNTER — Ambulatory Visit (HOSPITAL_COMMUNITY): Payer: 59 | Admitting: Licensed Clinical Social Worker

## 2023-09-02 ENCOUNTER — Telehealth (HOSPITAL_COMMUNITY): Payer: Self-pay | Admitting: Licensed Clinical Social Worker

## 2023-09-02 ENCOUNTER — Ambulatory Visit (HOSPITAL_COMMUNITY): Payer: 59 | Admitting: Licensed Clinical Social Worker

## 2023-09-02 NOTE — Telephone Encounter (Signed)
Clinician outreached client to offer availability in schedule today for virtual therapy appointment at 2pm. This call went immediately to voicemail. Clinician left a voicemail informing client of opening, and included contact number for front desk staff to assist.    [name], LCSW, LCAS 09/02/23

## 2023-09-10 DIAGNOSIS — E11610 Type 2 diabetes mellitus with diabetic neuropathic arthropathy: Secondary | ICD-10-CM | POA: Diagnosis not present

## 2023-09-10 DIAGNOSIS — G629 Polyneuropathy, unspecified: Secondary | ICD-10-CM | POA: Diagnosis not present

## 2023-09-10 DIAGNOSIS — M5442 Lumbago with sciatica, left side: Secondary | ICD-10-CM | POA: Diagnosis not present

## 2023-09-10 DIAGNOSIS — M47819 Spondylosis without myelopathy or radiculopathy, site unspecified: Secondary | ICD-10-CM | POA: Diagnosis not present

## 2023-09-10 DIAGNOSIS — G8929 Other chronic pain: Secondary | ICD-10-CM | POA: Diagnosis not present

## 2023-09-10 DIAGNOSIS — Z79899 Other long term (current) drug therapy: Secondary | ICD-10-CM | POA: Diagnosis not present

## 2023-09-28 ENCOUNTER — Ambulatory Visit (INDEPENDENT_AMBULATORY_CARE_PROVIDER_SITE_OTHER): Payer: 59 | Admitting: Licensed Clinical Social Worker

## 2023-09-28 DIAGNOSIS — F314 Bipolar disorder, current episode depressed, severe, without psychotic features: Secondary | ICD-10-CM

## 2023-09-28 DIAGNOSIS — F4310 Post-traumatic stress disorder, unspecified: Secondary | ICD-10-CM | POA: Diagnosis not present
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
## 2023-10-11 DIAGNOSIS — G8929 Other chronic pain: Secondary | ICD-10-CM | POA: Diagnosis not present

## 2023-10-11 DIAGNOSIS — G629 Polyneuropathy, unspecified: Secondary | ICD-10-CM | POA: Diagnosis not present

## 2023-10-11 DIAGNOSIS — M47819 Spondylosis without myelopathy or radiculopathy, site unspecified: Secondary | ICD-10-CM | POA: Diagnosis not present

## 2023-10-11 DIAGNOSIS — Z79899 Other long term (current) drug therapy: Secondary | ICD-10-CM | POA: Diagnosis not present

## 2023-10-11 DIAGNOSIS — M5442 Lumbago with sciatica, left side: Secondary | ICD-10-CM | POA: Diagnosis not present

## 2023-11-08 ENCOUNTER — Ambulatory Visit (INDEPENDENT_AMBULATORY_CARE_PROVIDER_SITE_OTHER): Payer: 59 | Admitting: Licensed Clinical Social Worker

## 2023-11-08 ENCOUNTER — Telehealth (HOSPITAL_BASED_OUTPATIENT_CLINIC_OR_DEPARTMENT_OTHER): Payer: 59 | Admitting: Psychiatry

## 2023-11-08 ENCOUNTER — Encounter (HOSPITAL_COMMUNITY): Payer: Self-pay | Admitting: Psychiatry

## 2023-11-08 VITALS — Wt 208.0 lb

## 2023-11-08 DIAGNOSIS — F3131 Bipolar disorder, current episode depressed, mild: Secondary | ICD-10-CM

## 2023-11-08 DIAGNOSIS — F314 Bipolar disorder, current episode depressed, severe, without psychotic features: Secondary | ICD-10-CM

## 2023-11-08 DIAGNOSIS — F4310 Post-traumatic stress disorder, unspecified: Secondary | ICD-10-CM

## 2023-11-08 DIAGNOSIS — F410 Panic disorder [episodic paroxysmal anxiety] without agoraphobia: Secondary | ICD-10-CM

## 2023-11-08 MED ORDER — CLONAZEPAM 0.5 MG PO TABS
0.5000 mg | ORAL_TABLET | Freq: Two times a day (BID) | ORAL | 2 refills | Status: DC
Start: 2023-11-08 — End: 2024-02-07

## 2023-11-08 MED ORDER — CHLORPROMAZINE HCL 100 MG PO TABS: 100.0000 mg | ORAL_TABLET | Freq: Every day | ORAL | 0 refills | Status: DC

## 2023-11-08 MED ORDER — LAMOTRIGINE 150 MG PO TABS: 300.0000 mg | ORAL_TABLET | Freq: Every day | ORAL | 0 refills | Status: DC
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
## 2023-11-08 NOTE — Progress Notes (Signed)
Virtual Visit via Video Note   I connected with Jodi Wolfe on 11/08/23 at 1:00pm by video enabled telemedicine application and verified that I am speaking with the correct person using two identifiers.   I discussed the limitations, risks, security and privacy concerns of performing an evaluation and management service by video and the availability of in person appointments. I also discussed with the patient that there may be a patient responsible charge related to this service. The patient expressed understanding and agreed to proceed.   I discussed the assessment and treatment plan with the patient. The patient was provided an opportunity to ask questions and all were answered. The patient agreed with the plan and demonstrated an understanding of the instructions.   The patient was advised to call back or seek an in-person evaluation if the symptoms worsen or if the condition fails to improve as anticipated.   I provided 30 minutes of non-face-to-face time during this encounter.   [name], LCSW, LCAS ________________________________ THERAPIST PROGRESS NOTE   Session Time: 1:00pm - 1:30pm  Location: Patient: Patient Home Provider: Home Office   Participation Level: Active   Behavioral Response: Alert, casually dressed, anxious mood/affect  Type of Therapy:  Individual Therapy   Treatment Goals addressed: Depression, panic attack, and anxiety management; Medication compliance   Progress Towards Goals: Progressing     Interventions: CBT: challenging anxious thoughts, problem solving     Summary: Jodi Wolfe is a 52 year old Caucasian female that presented for therapy appointment today with diagnoses of Bipolar Disorder, current episode depressed, severe w/out psychotic features; and PTSD.         Suicidal/Homicidal: None; without plan or intent.    Therapist Response: Clinician met with Jodi Wolfe for virtual therapy appointment today and assessed for safety, sobriety, and  medication compliance.  Jodi Wolfe presented for session on time and was alert, oriented x5, with no evidence or self-report of active SI/HI or A/V H.  Jodi Wolfe reported ongoing compliance with medication and denied any use of alcohol or illicit substances.  Jodi Wolfe denied experiencing any symptoms of mania.  Clinician inquired about Jodi Wolfe's emotional ratings today, as well as any significant changes in thoughts, feelings, or behavior since previous check-in. Jodi Wolfe reported scores of 2/10 for depression, 8/10 for anxiety, 2/10 for irritability, and noted that she has experienced roughly 3 panic attacks last week.  Jodi Wolfe reported that a recent struggle has been worrying about her granddaughter, who had a birthday, and has not been communicating as often with her.  Jodi Wolfe reported that she worries that she is not doing enough for family members like this, or that they have a negative impression of her due to the past.  Clinician utilized handout in session today titled "Worry exploration" in order to assist Jodi Wolfe in reducing her anxiety related to negative thinking.  This worksheet featured a series of Socratic questions aimed at exploring the most likely outcomes for a situation of concern, rather than focusing on the worst possible outcome (i.e. catastrophizing).  Clinician assisted Jodi Wolfe in identifying and challenging any irrational beliefs related to this worry, in addition to utilizing problem solving approach to explore strategies which would help her accomplish goal of supporting family without pushing herself past reasonable limits and personal boundaries.  Jodi Wolfe actively participated in discussion on handout, reporting that that there is sufficient evidence to suggest she is being a supportive family member and has not given her granddaughter reason to shut her out, as she will regularly go babysit when  possible, always sends gifts and messages her to check in.  Jodi Wolfe reported that she believes this has more to do with  what her son has told the daughter about his upbringing, and Jodi Wolfe recognizes that she has little control over his parenting style.  Intervention was effective, as evidenced by Jodi Wolfe  reporting that discussion on this subject reduced her anxiety and helped her think more clearly about the boundaries she could change with family, noting that she has had panic attacks triggered by stress related to saying "Yes" to often to the requests of others.  Jodi Wolfe stated "I know they've been using me, and it feels good to be needed, but I have to take care of myself too".   Clinician will continue to monitor.       Plan: Follow up again in 2 weeks virtually.   Diagnosis: Bipolar Disorder, current episode depressed, severe w/out psychotic features; and PTSD.  Collaboration of Care:   No collaboration required at this time.                                                   Patient/Guardian was advised Release of Information must be obtained prior to any record release in order to collaborate their care with an outside provider. Patient/Guardian was advised if they have not already done so to contact the registration department to sign all necessary forms in order for [place] to release information regarding their care.    Consent: Patient/Guardian gives verbal consent for treatment and assignment of benefits for services provided during this visit. Patient/Guardian expressed understanding and agreed to proceed.  [name], [place], LCAS 11/08/23

## 2023-11-15 ENCOUNTER — Telehealth: Payer: Self-pay

## 2023-11-15 NOTE — Patient Outreach (Signed)
Attempted to contact patient regarding MM, DM eye, colorectal, A1c, urine micro. Left voicemail for patient to return my call at [phone].  Nicholes Rough, CMA Care Guide [facility] [employer]

## 2023-11-23 ENCOUNTER — Telehealth (HOSPITAL_COMMUNITY): Payer: 59 | Admitting: Psychiatry

## 2023-11-29 DIAGNOSIS — G8929 Other chronic pain: Secondary | ICD-10-CM | POA: Diagnosis not present

## 2023-11-29 DIAGNOSIS — E11610 Type 2 diabetes mellitus with diabetic neuropathic arthropathy: Secondary | ICD-10-CM | POA: Diagnosis not present

## 2023-11-29 DIAGNOSIS — E7800 Pure hypercholesterolemia, unspecified: Secondary | ICD-10-CM | POA: Diagnosis not present

## 2023-11-29 DIAGNOSIS — G629 Polyneuropathy, unspecified: Secondary | ICD-10-CM | POA: Diagnosis not present

## 2023-11-29 DIAGNOSIS — M47819 Spondylosis without myelopathy or radiculopathy, site unspecified: Secondary | ICD-10-CM | POA: Diagnosis not present

## 2023-11-29 DIAGNOSIS — Z79899 Other long term (current) drug therapy: Secondary | ICD-10-CM | POA: Diagnosis not present

## 2023-11-29 DIAGNOSIS — E559 Vitamin D deficiency, unspecified: Secondary | ICD-10-CM | POA: Diagnosis not present

## 2023-11-29 DIAGNOSIS — M5442 Lumbago with sciatica, left side: Secondary | ICD-10-CM | POA: Diagnosis not present

## 2023-11-30 DIAGNOSIS — Z79899 Other long term (current) drug therapy: Secondary | ICD-10-CM | POA: Diagnosis not present

## 2023-12-15 ENCOUNTER — Ambulatory Visit (INDEPENDENT_AMBULATORY_CARE_PROVIDER_SITE_OTHER): Payer: 59 | Admitting: Licensed Clinical Social Worker

## 2023-12-15 DIAGNOSIS — F314 Bipolar disorder, current episode depressed, severe, without psychotic features: Secondary | ICD-10-CM

## 2023-12-15 DIAGNOSIS — F4310 Post-traumatic stress disorder, unspecified: Secondary | ICD-10-CM

## 2023-12-15 NOTE — Progress Notes (Signed)
Virtual Visit via Video Note   I connected with [name] on 12/15/23 at 1:00pm by video enabled telemedicine application and verified that I am speaking with the correct person using two identifiers.   I discussed the limitations, risks, security and privacy concerns of performing an evaluation and management service by video and the availability of in person appointments. I also discussed with the patient that there may be a patient responsible charge related to this service. The patient expressed understanding and agreed to proceed.   I discussed the assessment and treatment plan with the patient. The patient was provided an opportunity to ask questions and all were answered. The patient agreed with the plan and demonstrated an understanding of the instructions.   The patient was advised to call back or seek an in-person evaluation if the symptoms worsen or if the condition fails to improve as anticipated.   I provided 50 minutes of non-face-to-face time during this encounter.   Jodi Stain, Jodi Wolfe, Jodi Wolfe ________________________________ THERAPIST PROGRESS NOTE   Session Time: 1:00pm - 1:50pm   Location: Patient: Patient Home Provider: [facility]   Participation Level: Active   Behavioral Response: Alert, casually dressed, irritable mood/affect  Type of Therapy:  Individual Therapy   Treatment Goals addressed: Depression, panic attack, and anxiety management; Medication compliance; Establishing healthier boundaries in network    Progress Towards Goals: Progressing     Interventions: CBT, problem solving, psychoeducation on boundaries    Summary: Jodi Wolfe is a 52 year old Caucasian female that presented for therapy appointment today with diagnoses of Bipolar Disorder, current episode depressed, severe w/out psychotic features; and PTSD.         Suicidal/Homicidal: None; without plan or intent.    Therapist Response: Clinician met with Jodi Wolfe for virtual therapy session  today and assessed for safety, sobriety, and medication compliance.  Jodi Wolfe presented for appointment on time and was alert, oriented x5, with no evidence or self-report of active SI/HI or A/V H.  [name] reported ongoing compliance with medication and denied any use of alcohol or illicit substances.  [name] denied experiencing any symptoms of mania.  Clinician inquired about Jodi Wolfe's current emotional ratings, as well as any significant changes in thoughts, feelings, or behavior since last check-in. [name] reported scores of 1/10 for depression, 6/10 for anxiety, 7/10 for irritability, and noted that she has experienced roughly 3 panic attacks last week.  [name] reported that a recent success was having a birthday today, and receiving a nice card from family in the mail.  She reported that a struggle has been caring for her ex-husband's mother, who has dementia and has been declining rapidly.  [name] stated "My anxiety was really high yesterday and I have to make sure I can handle it mentally.  It runs through my mind when I try to go to sleep at night".  Clinician utilized a handout on the subject with [name] to assist.  This handout offered strategies for helping a family member struggling with dementia while maintaining healthy boundaries to protect oneself.  This included discussing the different types of dementia, and progression of symptoms to better familiarize herself with the condition, and effective forms of treatment.  Some of the suggestions offered included referring the individual for talk or rehabilitation therapy if warranted, or assisting them with scheduling/attending medical appointments and medication compliance.  Support for caregivers was also discussed, including attendance to support groups to socialize with like minded peers, prioritizing her own wellness appointments, and ensuring adequate  time for outside socialization and self-care.  Clinician inquired about changes [name] could make to her  routine to avoid triggering decline of her own mental or physical wellbeing while taking on this challenge.  Intervention was effective, as evidenced by [name]'s active engagement in discussion on subject, and receptiveness to strategies offered for providing help for family while avoiding overwhelming herself.  [name] reported that this helped her realize that the environment in the home is also bringing up bad memories, so she will need to set a reasonable timeframe for how much longer she can provide assistance.  [name] stated "Its not a permanent thing.  I don't know how much longer I can deal with it though".  She reported that she would also focus on praying and journaling more often, in addition to trying to find a church to attend again to increase spiritual support.  Clinician will continue to monitor.       Plan: Follow up again in 2 weeks virtually.   Diagnosis: Bipolar Disorder, current episode depressed, severe w/out psychotic features; and PTSD.  Collaboration of Care:   No collaboration required at this time.                                                   Patient/Guardian was advised Release of Information must be obtained prior to any record release in order to collaborate their care with an outside provider. Patient/Guardian was advised if they have not already done so to contact the registration department to sign all necessary forms in order for [place] to release information regarding their care.    Consent: Patient/Guardian gives verbal consent for treatment and assignment of benefits for services provided during this visit. Patient/Guardian expressed understanding and agreed to proceed.  Jodi Wolfe, Jodi Wolfe, Jodi Wolfe 12/15/23

## 2023-12-28 DIAGNOSIS — Z79899 Other long term (current) drug therapy: Secondary | ICD-10-CM | POA: Diagnosis not present

## 2023-12-28 DIAGNOSIS — M5442 Lumbago with sciatica, left side: Secondary | ICD-10-CM | POA: Diagnosis not present

## 2023-12-28 DIAGNOSIS — M47819 Spondylosis without myelopathy or radiculopathy, site unspecified: Secondary | ICD-10-CM | POA: Diagnosis not present

## 2023-12-28 DIAGNOSIS — E11610 Type 2 diabetes mellitus with diabetic neuropathic arthropathy: Secondary | ICD-10-CM | POA: Diagnosis not present

## 2023-12-28 DIAGNOSIS — G8929 Other chronic pain: Secondary | ICD-10-CM | POA: Diagnosis not present

## 2024-01-18 ENCOUNTER — Ambulatory Visit (HOSPITAL_COMMUNITY): Payer: 59 | Admitting: Licensed Clinical Social Worker

## 2024-01-18 DIAGNOSIS — F4310 Post-traumatic stress disorder, unspecified: Secondary | ICD-10-CM | POA: Diagnosis not present

## 2024-01-18 DIAGNOSIS — F314 Bipolar disorder, current episode depressed, severe, without psychotic features: Secondary | ICD-10-CM | POA: Diagnosis not present

## 2024-01-18 NOTE — Progress Notes (Signed)
Virtual Visit via Video Note   I connected with [name] on 01/18/24 at 1:00pm by video enabled telemedicine application and verified that I am speaking with the correct person using two identifiers.   I discussed the limitations, risks, security and privacy concerns of performing an evaluation and management service by video and the availability of in person appointments. I also discussed with the patient that there may be a patient responsible charge related to this service. The patient expressed understanding and agreed to proceed.   I discussed the assessment and treatment plan with the patient. The patient was provided an opportunity to ask questions and all were answered. The patient agreed with the plan and demonstrated an understanding of the instructions.   The patient was advised to call back or seek an in-person evaluation if the symptoms worsen or if the condition fails to improve as anticipated.   I provided 50 minutes of non-face-to-face time during this encounter.   [name], LCSW, LCAS ________________________________ THERAPIST PROGRESS NOTE   Session Time: 1:00pm - 1:50pm    Location: Patient: Patient Home Provider: [facility]   Participation Level: Active   Behavioral Response: Alert, casually dressed, anxious mood/affect   Type of Therapy:  Individual Therapy   Treatment Goals addressed: Depression, panic attack, and anxiety management; Medication compliance; Pursuing driver's license    Progress Towards Goals: Progressing     Interventions: CBT: challenging anxious thoughts    Summary: Rosemarie Poorman is a 53 year old Caucasian female that presented for therapy appointment today with diagnoses of Bipolar Disorder, current episode depressed, severe w/out psychotic features; and PTSD.         Suicidal/Homicidal: None; without plan or intent.    Therapist Response: Clinician met with Clarabel for virtual therapy appointment today and assessed for safety,  sobriety, and medication compliance.  Tejal presented for session on time and was alert, oriented x5, with no evidence or self-report of active SI/HI or A/V H.  Nykole reported ongoing compliance with medication and denied any use of alcohol or illicit substances.  Livana denied experiencing any symptoms of mania.  Clinician inquired about Kealy's emotional ratings today, as well as any significant changes in thoughts, feelings, or behavior since previous check-in. Taijah reported scores of 3/10 for depression, 7/10 for anxiety, 2/10 for irritability, and noted that she has experienced roughly 2 panic attacks past week.  Albie reported that a recent success was not having to watch her ex-mother in law anymore, which was a stressor.  She reported that she was also able to watch her youngest grandkids over the holiday break, but now that she hasn't had a busy, schedule, she has been more alone and bored.  Laykin stated "Maybe that is where my depression is coming from.  Sometimes I want to get out of the house". Clinician inquired about whether she has considered pursuing her driver's license in order to improve freedom and increase opportunities to attend social events.  Rylan reported that her family has been encouraging and she has considered this, but worries about failure.  Clinician utilized handout in session today titled "Worry exploration" in order to assist Melvina in reducing her anxiety related to getting her license renewed.  This worksheet featured a series of Socratic questions aimed at exploring the most likely outcomes for a situation of concern, rather than focusing on the worst possible outcome (i.e. catastrophizing).  Clinician assisted Emberleigh in identifying and challenging any irrational beliefs related to this worry, in addition  to utilizing problem solving approach to explore strategies which would help her accomplish goal of acquiring new driver's license.  Manda actively participated in discussion on  handout, reporting that she is worried that it has been too long since she has driven, and she will be unable to pass the tests.  Daesha reported that there is sufficient evidence to suggest she will be okay, though, since she can taking drivers classes to relearn the basics, use her coping skills to stay calm behind the wheel, and focus on just getting a learner's permit to start.  She reported that she would also benefit from reaching out to her children for support and advice.  Intervention was effective, as evidenced by Yuette reporting that discussion on this subject reduced her anxiety and increased her confidence in her ability to accomplish this task.  Charlann reported that she would plan to continue studying for the test until she feels adequately prepared, and eventually set a date to visit the DM[employer].  Keeghan stated "I'm just going to have to push myself to do it".  Clinician will continue to monitor.       Plan: Follow up again in 2 weeks virtually.   Diagnosis: Bipolar Disorder, current episode depressed, severe w/out psychotic features; and PTSD.  Collaboration of Care:   No collaboration required at this time.                                                   Patient/Guardian was advised Release of Information must be obtained prior to any record release in order to collaborate their care with an outside provider. Patient/Guardian was advised if they have not already done so to contact the registration department to sign all necessary forms in order for [place] to release information regarding their care.    Consent: Patient/Guardian gives verbal consent for treatment and assignment of benefits for services provided during this visit. Patient/Guardian expressed understanding and agreed to proceed.  [name], [place], LCAS 01/18/24

## 2024-02-07 ENCOUNTER — Encounter (HOSPITAL_COMMUNITY): Payer: Self-pay | Admitting: Psychiatry

## 2024-02-07 ENCOUNTER — Telehealth (HOSPITAL_BASED_OUTPATIENT_CLINIC_OR_DEPARTMENT_OTHER): Payer: 59 | Admitting: Psychiatry

## 2024-02-07 VITALS — Wt 206.0 lb

## 2024-02-07 DIAGNOSIS — F4310 Post-traumatic stress disorder, unspecified: Secondary | ICD-10-CM | POA: Diagnosis not present

## 2024-02-07 DIAGNOSIS — F3131 Bipolar disorder, current episode depressed, mild: Secondary | ICD-10-CM

## 2024-02-07 DIAGNOSIS — F410 Panic disorder [episodic paroxysmal anxiety] without agoraphobia: Secondary | ICD-10-CM

## 2024-02-07 MED ORDER — CLONAZEPAM 0.5 MG PO TABS: 0.5000 mg | ORAL_TABLET | Freq: Two times a day (BID) | ORAL | 2 refills | Status: DC

## 2024-02-07 MED ORDER — CHLORPROMAZINE HCL 100 MG PO TABS: 100.0000 mg | ORAL_TABLET | Freq: Every day | ORAL | 0 refills | Status: DC

## 2024-02-07 MED ORDER — LAMOTRIGINE 150 MG PO TABS: 300.0000 mg | ORAL_TABLET | Freq: Every day | ORAL | 0 refills | Status: DC
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
## 2024-02-17 ENCOUNTER — Ambulatory Visit (INDEPENDENT_AMBULATORY_CARE_PROVIDER_SITE_OTHER): Payer: 59 | Admitting: Licensed Clinical Social Worker

## 2024-02-17 DIAGNOSIS — F4310 Post-traumatic stress disorder, unspecified: Secondary | ICD-10-CM

## 2024-02-17 DIAGNOSIS — F314 Bipolar disorder, current episode depressed, severe, without psychotic features: Secondary | ICD-10-CM | POA: Diagnosis not present

## 2024-02-17 NOTE — Progress Notes (Signed)
 Virtual Visit via Video Note   I connected with Jodi Wolfe on 02/17/24 at 1:00pm by video enabled telemedicine application and verified that I am speaking with the correct person using two identifiers.   I discussed the limitations, risks, security and privacy concerns of performing an evaluation and management service by video and the availability of in person appointments. I also discussed with the patient that there may be a patient responsible charge related to this service. The patient expressed understanding and agreed to proceed.   I discussed the assessment and treatment plan with the patient. The patient was provided an opportunity to ask questions and all were answered. The patient agreed with the plan and demonstrated an understanding of the instructions.   The patient was advised to call back or seek an in-person evaluation if the symptoms worsen or if the condition fails to improve as anticipated.   I provided 46 minutes of non-face-to-face time during this encounter.   Jodi Stain, LCSW, LCAS ________________________________ THERAPIST PROGRESS NOTE   Session Time: 1:00pm - 1:46pm   Location: Patient: Patient Home Provider: Home Office   Participation Level: Active   Behavioral Response: Alert, casually dressed, anxious mood/affect   Type of Therapy:  Individual Therapy   Treatment Goals addressed: Depression, panic attack, and anxiety management; Medication compliance   Progress Towards Goals: Progressing     Interventions: CBT, anger management    Summary: Jodi Wolfe is a 53 year old Caucasian female that presented for therapy appointment today with diagnoses of Bipolar Disorder, current episode depressed, severe w/out psychotic features; and PTSD.         Suicidal/Homicidal: None; without plan or intent.    Therapist Response: Clinician met with Jodi Wolfe for virtual therapy session today and assessed for safety, sobriety, and medication compliance.  Jodi Wolfe presented  for appointment on time and was alert, oriented x5, with no evidence or self-report of active SI/HI or A/V H.  Jodi Wolfe reported ongoing compliance with medication and denied any use of alcohol or illicit substances.  Jodi Wolfe denied experiencing any symptoms of mania.  Clinician inquired about Jodi Wolfe's current emotional ratings, as well as any significant changes in thoughts, feelings, or behavior since last check-in. Jodi Wolfe reported scores of 3/10 for depression, 8/10 for anxiety, 3/10 for irritability, and noted that she has experienced roughly 4 panic attacks last week.  Jodi Wolfe reported that a recent success was getting the chance to visit her son and his family in [place] for a week.  Jodi Wolfe reported that a struggle was being informed that they will be moving to [place], since he has a new job offer.  Jodi Wolfe reported that she has had crying spells and panic attacks worrying about the distance this will create between them.  She reported that one emotion in particular she has struggled with is anger, which has been aimed at her son's wife, stating "I have to be angry at someone".  Clinician utilized an Building surveyor handout with Jodi Wolfe in session to assist.  This handout explained how anger tends to be an emotion that is easily identifiable due to outward behavior such as frequent emotional outbursts, but can conceal other difficult emotions under the surface if compartmentalization is used.  It was also noted that triggering people, places, and things can influence this emotion, as well as potential outbursts.  A list of common emotions linked to anger was provided, and Jodi Wolfe was tasked with identifying which ones could be having a present influence, in addition to related  triggers.  Clinician also encouraged Jodi Wolfe to consider self-care activities which could offer an appropriate stress outlet, and explored the subject with a list of various hobbies that could be helpful to include in routine. Intervention was  effective, as evidenced by Jodi Wolfe actively engaging in discussion on subject, reporting that there are numerous underlying emotions fueling this anger, such as disappointment, loneliness, sadness, abandonment, and grief.  Jodi Wolfe reported that she has been trying to bury these emotions and avoid reacting in a negative manner, but the feeling of abandonment from her family's move has pushed her limits.  She reported that she feels particularly angry toward her son's partner, who can be a trigger, and she will plan to adjust boundaries accordingly.  Jodi Wolfe reported that she will put more energy into getting her driver's license so that she can become more independent and engage in healthy self-care activities for distraction, such as going to church again and making new friends.  Clinician will continue to monitor.       Plan: Follow up again in 2 weeks virtually.   Diagnosis: Bipolar Disorder, current episode depressed, severe w/out psychotic features; and PTSD.  Collaboration of Care:   No collaboration required at this time.                                                   Patient/Guardian was advised Release of Information must be obtained prior to any record release in order to collaborate their care with an outside provider. Patient/Guardian was advised if they have not already done so to contact the registration department to sign all necessary forms in order for [place] to release information regarding their care.    Consent: Patient/Guardian gives verbal consent for treatment and assignment of benefits for services provided during this visit. Patient/Guardian expressed understanding and agreed to proceed.  Jodi Stain, LCSW, LCAS 02/17/24

## 2024-03-23 ENCOUNTER — Ambulatory Visit (HOSPITAL_COMMUNITY): Payer: 59 | Admitting: Licensed Clinical Social Worker

## 2024-03-23 DIAGNOSIS — F314 Bipolar disorder, current episode depressed, severe, without psychotic features: Secondary | ICD-10-CM | POA: Diagnosis not present

## 2024-03-23 DIAGNOSIS — F4310 Post-traumatic stress disorder, unspecified: Secondary | ICD-10-CM | POA: Diagnosis not present

## 2024-03-23 NOTE — Progress Notes (Signed)
 Virtual Visit via Video Note   I connected with Jodi Wolfe on 03/23/24 at 3:00pm by video enabled telemedicine application and verified that I am speaking with the correct person using two identifiers.   I discussed the limitations, risks, security and privacy concerns of performing an evaluation and management service by video and the availability of in person appointments. I also discussed with the patient that there may be a patient responsible charge related to this service. The patient expressed understanding and agreed to proceed.   I discussed the assessment and treatment plan with the patient. The patient was provided an opportunity to ask questions and all were answered. The patient agreed with the plan and demonstrated an understanding of the instructions.   The patient was advised to call back or seek an in-person evaluation if the symptoms worsen or if the condition fails to improve as anticipated.   I provided 50 minutes of non-face-to-face time during this encounter.   [name], LCSW, LCAS ________________________________ THERAPIST PROGRESS NOTE   Session Time: 3:00pm - 3:50pm   Location: Patient: Patient Home Provider: [facility]   Participation Level: Active   Behavioral Response: Alert, casually dressed, anxious mood/affect   Type of Therapy:  Individual Therapy   Treatment Goals addressed: Depression, panic attack, and anxiety management; Medication compliance   Progress Towards Goals: Progressing    Interventions: CBT, stress management    Summary: Jodi Wolfe is a 53 year old Caucasian female that presented for therapy appointment today with diagnoses of Bipolar Disorder, current episode depressed, severe w/out psychotic features; and PTSD.         Suicidal/Homicidal: None; without plan or intent.    Therapist Response: Jodi Wolfe met with Jodi Wolfe for virtual therapy appointment today and assessed for safety, sobriety, and medication compliance.  Jodi Wolfe  presented for session on time and was alert, oriented x5, with no evidence or self-report of active SI/HI or A/V H.  Jodi Wolfe reported ongoing compliance with medication and denied any use of alcohol or illicit substances.  Jodi Wolfe denied experiencing any symptoms of mania.  Jodi Wolfe inquired about Jodi Wolfe's emotional ratings today, as well as any significant changes in thoughts, feelings, or behavior since previous check-in. Jodi Wolfe reported scores of 3/10 for depression, 8/10 for anxiety, 1/10 for irritability, and noted that she has experienced roughly 4 panic attacks past week.  Jodi Wolfe reported that a recent struggle was learning that her other son will be moving their family to [place] permanently, stating "He said they are never coming back".  Jodi Wolfe reported that she has been very stressed about this, and noticed that her physical pain has also increased, as well as frequency of headaches.  Jodi Wolfe covered topic of stress management to assist.  Jodi Wolfe utilized a handout which provided psychoeducation on link between tension in the body's muscle groups and emotional stress, and offered to guide Jodi Wolfe through progressive muscle relaxation exercise today to assist.  She was agreeable to this, so Jodi Wolfe coached Jodi Wolfe through process of going through each major muscle group of the body (i.e. arms, legs, neck, shoulders, etc), tensing muscles for 5 seconds, and then releasing tension while mentally stating "Relax" to provide relief from related stress.  Jodi Wolfe instructed Jodi Wolfe to avoid any areas where previous injuries have been sustained.  Jodi Wolfe inquired about any insight Jodi Wolfe gained into her most tense muscle groups, and outcome of practicing this today in session.  Intervention was effective, as evidenced by Jodi Wolfe participating in activity successfully and reported that this not  only increased insight into muscle groups where she was retaining stress such as neck and shoulders, but also lowered related pain  from 9/10 in severity down to 7/10 afterward.  She stated "I do feel a little better.  I could see where this might help if I do it daily".  Jodi Wolfe encouraged Jodi Wolfe to add this to her self-care routine and emailed her the handout to practice on her own.  Jodi Wolfe will continue to monitor.       Plan: Follow up again in 2 weeks virtually.   Diagnosis: Bipolar Disorder, current episode depressed, severe w/out psychotic features; and PTSD.  Collaboration of Care:   No collaboration required at this time.                                                   Patient/Guardian was advised Release of Information must be obtained prior to any record release in order to collaborate their care with an outside provider. Patient/Guardian was advised if they have not already done so to contact the registration department to sign all necessary forms in order for [place] to release information regarding their care.    Consent: Patient/Guardian gives verbal consent for treatment and assignment of benefits for services provided during this visit. Patient/Guardian expressed understanding and agreed to proceed.  [name], LCSW, LCAS 03/23/24

## 2024-04-13 ENCOUNTER — Telehealth (HOSPITAL_COMMUNITY): Payer: Self-pay | Admitting: *Deleted

## 2024-04-13 ENCOUNTER — Other Ambulatory Visit (HOSPITAL_COMMUNITY): Payer: Self-pay | Admitting: *Deleted

## 2024-04-13 DIAGNOSIS — F4310 Post-traumatic stress disorder, unspecified: Secondary | ICD-10-CM

## 2024-04-13 DIAGNOSIS — F410 Panic disorder [episodic paroxysmal anxiety] without agoraphobia: Secondary | ICD-10-CM

## 2024-04-13 MED ORDER — CLONAZEPAM 0.5 MG PO TABS: 0.5000 mg | ORAL_TABLET | Freq: Two times a day (BID) | ORAL | 0 refills | Status: DC

## 2024-04-13 NOTE — Telephone Encounter (Signed)
 She's back in [place]  however [facility] [address] not [facility]ill because the previous script was filled in [place]  and that one still has a refill showing.

## 2024-04-13 NOTE — Telephone Encounter (Signed)
 Pt called requesting a refill of the Klonopin 0.5 mg BID. Pt is showing refill in [facility] however writer spoke with pharmacy and the refill is at pharmacy in [place] . Pt was down there for a family emergency and scripts were sent there. [facility] [place] said they need [facility] new script in order to fill. Pt has f/u scheduled for 05/01/24. Please review and advise.

## 2024-04-13 NOTE — Telephone Encounter (Signed)
 Even though she is helping her family member in [place] , we agreed that she will keep the medication from pharmacy in [place] .  I will not write prescription out-of-state due to controlled substance.

## 2024-04-13 NOTE — Telephone Encounter (Signed)
 Please cancel the refill but was called [place]  and send a new prescription.  In the future she will keep 1 pharmacy for controlled substance.

## 2024-04-13 NOTE — Telephone Encounter (Signed)
 [facility] prescription for Clonazepam 0.5 mg tabs BID called to [id number] in [place].

## 2024-04-25 ENCOUNTER — Encounter (HOSPITAL_COMMUNITY): Payer: Self-pay

## 2024-04-25 ENCOUNTER — Ambulatory Visit (HOSPITAL_COMMUNITY): Admitting: Licensed Clinical Social Worker

## 2024-05-01 ENCOUNTER — Telehealth (HOSPITAL_BASED_OUTPATIENT_CLINIC_OR_DEPARTMENT_OTHER): Payer: 59 | Admitting: Psychiatry

## 2024-05-01 ENCOUNTER — Encounter (HOSPITAL_COMMUNITY): Payer: Self-pay | Admitting: Psychiatry

## 2024-05-01 DIAGNOSIS — F410 Panic disorder [episodic paroxysmal anxiety] without agoraphobia: Secondary | ICD-10-CM | POA: Diagnosis not present

## 2024-05-01 DIAGNOSIS — F4310 Post-traumatic stress disorder, unspecified: Secondary | ICD-10-CM

## 2024-05-01 DIAGNOSIS — F3131 Bipolar disorder, current episode depressed, mild: Secondary | ICD-10-CM

## 2024-05-01 MED ORDER — CLONAZEPAM 0.5 MG PO TABS: 0.5000 mg | ORAL_TABLET | Freq: Two times a day (BID) | ORAL | 2 refills | Status: DC

## 2024-05-01 MED ORDER — LAMOTRIGINE 150 MG PO TABS: 300.0000 mg | ORAL_TABLET | Freq: Every day | ORAL | 0 refills | Status: DC

## 2024-05-01 MED ORDER — CHLORPROMAZINE HCL 100 MG PO TABS: 100.0000 mg | ORAL_TABLET | Freq: Every day | ORAL | 0 refills | Status: DC
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
## 2024-06-12 ENCOUNTER — Telehealth: Payer: Self-pay | Admitting: *Deleted

## 2024-06-12 NOTE — Transitions of Care (Post Inpatient/ED Visit) (Signed)
   06/12/2024  Name: Jodi Wolfe MRN: [id number] DOB: Aug 11, 1971  Today's TOC FU Call Status: Today's TOC FU Call Status:: Unsuccessful Call (1st Attempt) Unsuccessful Call (1st Attempt) Date: 06/12/24  Attempted to reach the patient regarding the most recent Inpatient/ED visit.  Follow Up Plan: Additional outreach attempts will be made to reach the patient to complete the Transitions of Care (Post Inpatient/ED visit) call.   Una Ganser BSN RN [facility] Health Care Management Coordinator [email or web address] Direct Dial: [phone]  Fax: [phone] Website: [email or web address]

## 2024-06-13 ENCOUNTER — Telehealth: Payer: Self-pay | Admitting: *Deleted

## 2024-06-13 ENCOUNTER — Ambulatory Visit (INDEPENDENT_AMBULATORY_CARE_PROVIDER_SITE_OTHER): Admitting: Licensed Clinical Social Worker

## 2024-06-13 DIAGNOSIS — F314 Bipolar disorder, current episode depressed, severe, without psychotic features: Secondary | ICD-10-CM

## 2024-06-13 DIAGNOSIS — F4310 Post-traumatic stress disorder, unspecified: Secondary | ICD-10-CM

## 2024-06-13 NOTE — Transitions of Care (Post Inpatient/ED Visit) (Signed)
   06/13/2024  Name: Osiris Charles MRN: [id number] DOB: [birth year]  Today's TOC FU Call Status: Today's TOC FU Call Status:: Unsuccessful Call (2nd Attempt) Unsuccessful Call (2nd Attempt) Date: 06/13/24  Attempted to reach the patient regarding the most recent Inpatient/ED visit.  Follow Up Plan: Additional outreach attempts will be made to reach the patient to complete the Transitions of Care (Post Inpatient/ED visit) call.   Una Ganser BSN RN [facility] Health Care Management Coordinator [email or web address] Direct Dial: [phone]  Fax: [phone] Website: [email or web address]

## 2024-06-13 NOTE — Progress Notes (Signed)
 Virtual Visit via Video Note   I connected with Vern Going on 06/13/24 at 2:00pm by video enabled telemedicine application and verified that I am speaking with the correct person using two identifiers.   I discussed the limitations, risks, security and privacy concerns of performing an evaluation and management service by video and the availability of in person appointments. I also discussed with the patient that there may be a patient responsible charge related to this service. The patient expressed understanding and agreed to proceed.   I discussed the assessment and treatment plan with the patient. The patient was provided an opportunity to ask questions and all were answered. The patient agreed with the plan and demonstrated an understanding of the instructions.   The patient was advised to call back or seek an in-person evaluation if the symptoms worsen or if the condition fails to improve as anticipated.   I provided 35 minutes of non-face-to-face time during this encounter.   Desmond Florida, LCSW, LCAS ________________________________ THERAPIST PROGRESS NOTE   Session Time: 2:00pm - 2:35pm    Location: Patient: Patient Home Provider: [facility]   Participation Level: Active   Behavioral Response: Alert, casually dressed, anxious mood/affect   Type of Therapy:  Individual Therapy   Treatment Goals addressed: Depression, panic attack, and anxiety management; Medication compliance   Progress Towards Goals: Progressing    Interventions: CBT: self-care assessment    Summary: Mandie Crabbe is a 53 year old Caucasian female that presented for therapy appointment today with diagnoses of Bipolar Disorder, current episode depressed, severe w/out psychotic features; and PTSD.         Suicidal/Homicidal: None; without plan or intent.    Therapist Response: Clinician met with Janissa for virtual therapy session today and assessed for safety, sobriety, and medication compliance.  Nafisah  presented for appointment on time and was alert, oriented x5, with no evidence or self-report of active SI/HI or A/V H.  Briel reported ongoing compliance with medication and denied any use of alcohol or illicit substances.  Gaynel denied experiencing any symptoms of mania.  Clinician inquired about Jolleen's current emotional ratings, as well as any significant changes in thoughts, feelings, or behavior since last check-in. Trinaty reported scores of 2/10 for depression, 8/10 for anxiety, 0/10 for anger/irritability, and noted that she has experienced roughly 5 panic attacks last week.  Shali reported that a recent struggle was getting very sick and having to be hospitalized for roughly 1 week. She reported that the doctor's determined that her blood sugar reached a dangerous level, she experienced symptoms of delirium, and stated I had hypoxia, but I don't know what that means.  I know I was pretty sick.  She reported that since release, she is on an oxygen machine, was given a walker as she builds strength again, and has been recommended to take better care of her physical health.  Clinician introduced topic of self-care today.  Clinician explained how this can be defined as the things one does to maintain good health and improve well-being.  Clinician provided Nabilah with a self-care assessment form to complete.  This handout featured various sub-categories of self-care, including physical, psychological/emotional, social, spiritual, and professional.  Yaretzy was asked to rank her engagement in the activities listed for each dimension on a scale of 1-3, with 1 indicating 'Poor', 2 indicating 'Ok', and 3 indicating 'Well'.  Clinician invited Naziya to share results of her assessment, and inquired about which areas of self-care she is doing well  in, as well as areas that require attention, and how she plans to begin addressing this during treatment.  Intervention was effective, as evidenced by Kadejah successfully  completing initial section (physical) of assessment and actively engaging in discussion on subject, reporting that she is excelling at resting when sick presently, but would benefit from focusing more on areas such as eating healthy foods, taking care of personal hygiene, exercise, wearing clothes that maker her feel good, and getting enough sleep.  Safa reported that she would work to improve self-care deficits by adjusting diet to reduce carbs and sugar intake, taking showers more regularly when she is physically able to navigate to the bathroom, using her walker to get around for light exercise during the week as she builds strength, donating some clothes that don't fit anymore and getting new clothing that is more comfortable, and taking prescription sleep medicine to improve nightly rest.  She reported that she will also prioritize followup appointments with her doctor to monitor progress as she recuperates from recent medical event.  Clinician will continue to monitor.       Plan: Follow up again in 2 weeks virtually.   Diagnosis: Bipolar Disorder, current episode depressed, severe w/out psychotic features; and PTSD.  Collaboration of Care:   No collaboration required at this time.                                                   Patient/Guardian was advised Release of Information must be obtained prior to any record release in order to collaborate their care with an outside provider. Patient/Guardian was advised if they have not already done so to contact the registration department to sign all necessary forms in order for us  to release information regarding their care.    Consent: Patient/Guardian gives verbal consent for treatment and assignment of benefits for services provided during this visit. Patient/Guardian expressed understanding and agreed to proceed.  Desmond Florida, LCSW, LCAS 06/13/24

## 2024-06-14 ENCOUNTER — Telehealth: Payer: Self-pay | Admitting: *Deleted

## 2024-06-14 NOTE — Transitions of Care (Post Inpatient/ED Visit) (Signed)
   06/14/2024  Name: Jodi Wolfe MRN: [id number] DOB: May 16, 1971  Today's TOC FU Call Status: Today's TOC FU Call Status:: Unsuccessful Call (3rd Attempt) Unsuccessful Call (3rd Attempt) Date: 06/14/24  Attempted to reach the patient regarding the most recent Inpatient/ED visit.  Follow Up Plan: No further outreach attempts will be made at this time. We have been unable to contact the patient.  Una Ganser BSN RN [facility] Health Care Management Coordinator [email or web address] Direct Dial: [phone]  Fax: [phone] Website: [email or web address]

## 2024-07-24 ENCOUNTER — Ambulatory Visit (INDEPENDENT_AMBULATORY_CARE_PROVIDER_SITE_OTHER): Admitting: Licensed Clinical Social Worker

## 2024-07-24 DIAGNOSIS — F314 Bipolar disorder, current episode depressed, severe, without psychotic features: Secondary | ICD-10-CM

## 2024-07-24 DIAGNOSIS — F4310 Post-traumatic stress disorder, unspecified: Secondary | ICD-10-CM

## 2024-07-24 NOTE — Progress Notes (Signed)
 Virtual Visit via Video Note   I connected with Jodi Wolfe on 07/24/24 at 2:11pm by video enabled telemedicine application and verified that I am speaking with the correct person using two identifiers.   I discussed the limitations, risks, security and privacy concerns of performing an evaluation and management service by video and the availability of in person appointments. I also discussed with the patient that there may be a patient responsible charge related to this service. The patient expressed understanding and agreed to proceed.   I discussed the assessment and treatment plan with the patient. The patient was provided an opportunity to ask questions and all were answered. The patient agreed with the plan and demonstrated an understanding of the instructions.   The patient was advised to call back or seek an in-person evaluation if the symptoms worsen or if the condition fails to improve as anticipated.   I provided 39 minutes of non-face-to-face time during this encounter.   [name], LCSW, LCAS ________________________________ THERAPIST PROGRESS NOTE   Session Time: 2:11pm - 2:50pm   Location: Patient: Patient Home Provider: [facility]   Participation Level: Active   Behavioral Response: Alert, casually dressed, anxious mood/affect   Type of Therapy:  Individual Therapy   Treatment Goals addressed: Depression, panic attack, and anxiety management; Medication compliance; Establishing healthier boundaries with network    Progress Towards Goals: Progressing    Interventions: CBT: SMART goals, problem solving    Summary: Jodi Wolfe is a 53 year old Caucasian female that presented for therapy appointment today with diagnoses of Bipolar Disorder, current episode depressed, severe w/out psychotic features; and PTSD.         Suicidal/Homicidal: None; without plan or intent.    Therapist Response: Clinician met with Jodi Wolfe for virtual therapy appointment today and assessed  for safety, sobriety, and medication compliance.  Jodi Wolfe presented for session 11 minutes late due to not receiving an automated invite to session, but logged on after phone call addressing this issue.  She was alert, oriented x5, with no evidence or self-report of active SI/HI or A/V H.  Jodi Wolfe reported ongoing compliance with medication and denied any use of alcohol or illicit substances.  Andreana denied experiencing any symptoms of mania.  Clinician inquired about Jodi Wolfe's emotional ratings today, as well as any significant changes in thoughts, feelings, or behavior since previous check-in. Jodi Wolfe reported scores of 2/10 for depression, 8/10 for anxiety, 0/10 for anger/irritability, and noted that she has experienced roughly 2 panic attacks last week.  Jodi Wolfe reported that a recent success was being taken off of oxygen since she has been recovering from recent medical event.  She reported that she has spoken with her doctor, changed medication, and is checking her sugar levels and trying to get exercise each day. She reported that a struggle was recently learning that the son that owns the home she is in will sell it soon, and she will need to move to stay with his family in [place] .  Clinician informed Jodi Wolfe that due to this provider only being licensed in [place] , she would be discharged from care if she does decide to move.  Jodi Wolfe acknowledged understanding, and reported that she will not likely be moving until the end of the year, but is overwhelmed by the changes that the move would entail, stating I don't know how to feel.  Clinician introduced topic of goal setting today.  Clinician virtually shared a handout on SMART goals to help guide discussion.  This handout explained how using this acronym can help break large goals down into smaller, realistic steps which are more achievable, and went through each section (Specific, Measurable, Attainable, Relevant, and Time Bound) using open ended  questions to guide discussion (i.e. What am I going to do?; How will I measure success?; What do I need to do to get started today?; etc).  Clinician tasked Jodi Wolfe with identifying primary goals to focus on in upcoming months, using this acronym to clarify steps she can begin taking now, and consider obstacles that may arise, along with helpful tools and resources that could be utilized to increase chance of successful completion.  Intervention was effective, as evidenced by Jodi Wolfe participating in handout and reporting that her main priorities at the moment are to figure out whether she will be moving in with her daughter or son, create a list of potential providers in each area to link with when move is secured, and secure a storage unit for belongings she can't take,  She reported that she will plan to outreach insurance this week about assistance they can offer with changing coverage or providers.  Jodi Wolfe reported that she will also make a list of storage companies and compare pricing over the next month.  Jodi Wolfe reported that she will also stay in touch with her children for updates on their housing and plans to ensure that she is kept in the loop regarding their plans.  Jodi Wolfe reported that she hopes that these steps will help her feel less anxious, ensure the transition is more predictable, and suits her health needs.  Jodi Wolfe stated "I know I shouldn't worry too much.  I have to take it one day at a time.  I can cause panic attacks if I'm not careful".  Clinician will continue to monitor.       Plan: Follow up again in 2 weeks virtually.   Diagnosis: Bipolar Disorder, current episode depressed, severe w/out psychotic features; and PTSD.  Collaboration of Care:   No collaboration required at this time.                                                   Patient/Guardian was advised Release of Information must be obtained prior to any record release in order to collaborate their care with an outside provider.  Patient/Guardian was advised if they have not already done so to contact the registration department to sign all necessary forms in order for us  to release information regarding their care.    Consent: Patient/Guardian gives verbal consent for treatment and assignment of benefits for services provided during this visit. Patient/Guardian expressed understanding and agreed to proceed.  [name], LCSW, LCAS 07/24/24

## 2024-07-28 ENCOUNTER — Other Ambulatory Visit (HOSPITAL_COMMUNITY): Payer: Self-pay

## 2024-07-28 DIAGNOSIS — F410 Panic disorder [episodic paroxysmal anxiety] without agoraphobia: Secondary | ICD-10-CM

## 2024-07-28 DIAGNOSIS — F4310 Post-traumatic stress disorder, unspecified: Secondary | ICD-10-CM

## 2024-07-28 MED ORDER — CLONAZEPAM 0.5 MG PO TABS: 0.5000 mg | ORAL_TABLET | Freq: Two times a day (BID) | ORAL | 0 refills | Status: DC

## 2024-08-01 ENCOUNTER — Telehealth (HOSPITAL_COMMUNITY): Admitting: Psychiatry

## 2024-08-01 ENCOUNTER — Encounter (HOSPITAL_COMMUNITY): Payer: Self-pay | Admitting: Psychiatry

## 2024-08-01 VITALS — Wt 185.0 lb

## 2024-08-01 DIAGNOSIS — F410 Panic disorder [episodic paroxysmal anxiety] without agoraphobia: Secondary | ICD-10-CM

## 2024-08-01 DIAGNOSIS — F4310 Post-traumatic stress disorder, unspecified: Secondary | ICD-10-CM | POA: Diagnosis not present

## 2024-08-01 DIAGNOSIS — F3131 Bipolar disorder, current episode depressed, mild: Secondary | ICD-10-CM

## 2024-08-01 MED ORDER — CLONAZEPAM 0.5 MG PO TABS: 0.5000 mg | ORAL_TABLET | Freq: Two times a day (BID) | ORAL | 2 refills | Status: DC

## 2024-08-01 MED ORDER — LAMOTRIGINE 150 MG PO TABS: 300.0000 mg | ORAL_TABLET | Freq: Every day | ORAL | 0 refills | Status: DC

## 2024-08-01 MED ORDER — CHLORPROMAZINE HCL 100 MG PO TABS: 100.0000 mg | ORAL_TABLET | Freq: Every day | ORAL | 0 refills | Status: DC
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
## 2024-08-22 ENCOUNTER — Telehealth (HOSPITAL_COMMUNITY): Payer: Self-pay | Admitting: Psychiatry

## 2024-08-22 NOTE — Telephone Encounter (Signed)
 1:40pm 08/22/24 Called patient due to a Release of Information recd via mail- Per patient Dr. Elizbeth no longer works at [employer] explained to the patient that she will have to fill-out another ROI.

## 2024-08-24 ENCOUNTER — Telehealth (INDEPENDENT_AMBULATORY_CARE_PROVIDER_SITE_OTHER): Admitting: Licensed Clinical Social Worker

## 2024-08-24 DIAGNOSIS — F314 Bipolar disorder, current episode depressed, severe, without psychotic features: Secondary | ICD-10-CM | POA: Diagnosis not present

## 2024-08-24 DIAGNOSIS — F4310 Post-traumatic stress disorder, unspecified: Secondary | ICD-10-CM | POA: Diagnosis not present

## 2024-08-24 NOTE — Progress Notes (Signed)
 Virtual Visit via Video Note   I connected with Jodi Wolfe on 08/24/24 at 3:00pm by video enabled telemedicine application and verified that I am speaking with the correct person using two identifiers.   I discussed the limitations, risks, security and privacy concerns of performing an evaluation and management service by video and the availability of in person appointments. I also discussed with the patient that there may be a patient responsible charge related to this service. The patient expressed understanding and agreed to proceed.   I discussed the assessment and treatment plan with the patient. The patient was provided an opportunity to ask questions and all were answered. The patient agreed with the plan and demonstrated an understanding of the instructions.   The patient was advised to call back or seek an in-person evaluation if the symptoms worsen or if the condition fails to improve as anticipated.   I provided 51 minutes of non-face-to-face time during this encounter.   [name], LCSW, LCAS ________________________________ THERAPIST PROGRESS NOTE   Session Time: 3:00pm - 3:51pm    Location: Patient: Patient Home Provider: [facility]   Participation Level: Active   Behavioral Response: Alert, casually dressed, anxious mood/affect   Type of Therapy:  Individual Therapy   Treatment Goals addressed: Depression, panic attack, and anxiety management; Medication compliance   Progress Towards Goals: Progressing    Interventions: CBT: challenging anxious thoughts    Summary: Jodi Wolfe is a 53 year old Caucasian female that presented for therapy appointment today with diagnoses of Bipolar Disorder, current episode depressed, severe w/out psychotic features; and PTSD.         Suicidal/Homicidal: None; without plan or intent.    Therapist Response: Clinician met with [name] for virtual therapy session today and assessed for safety, sobriety, and medication compliance.   [name] presented for appointment on time and was alert, oriented x5, with no evidence or self-report of active SI/HI or A/V H.  [name] reported ongoing compliance with medication and denied any use of alcohol or illicit substances.  [name] denied experiencing any symptoms of mania.  Clinician inquired about Jodi Wolfe's current emotional ratings, as well as any significant changes in thoughts, feelings, or behavior since last check-in. [name] reported scores of 2/10 for depression, 8/10 for anxiety, 0/10 for anger/irritability, and noted that she has experienced roughly 2 panic attacks this week.  [name] reported that a recent success was learning that her daughter will close on a house in [place] next month, so she will be able to move in with them rather than move to another state.  [name] reported that she does worry about being a burden to her daughter's family, and this has kept her up at night.  Clinician utilized handout in session today titled Worry exploration in order to assist Jodi Wolfe in reducing her anxiety related to being a 'burden' to her family.  This worksheet featured a series of Socratic questions aimed at exploring the most likely outcomes for a situation of concern, rather than focusing on the worst possible outcome (i.e. catastrophizing).  Clinician assisted Jodi Wolfe in identifying and challenging any irrational beliefs related to this worry, in addition to utilizing problem solving approach to explore strategies which would help her accomplish goal of setting healthy boundaries in order to ensure that she doesn't ask too much of family and overwhelm them.  [name] actively participated in discussion on handout, reporting that she is worried that there is sufficient evidence to suggest that she isn't a burden to her  family, and is in fact helpful in addressing their needs as well.  She reported that she has helped her children over the years in raising multiple grandchildren, moving to a different city in  order to watch over her son's house, maintain it, and watch over the animals and sacrifice friends and church.  Intervention was effective, as evidenced by Jodi Wolfe reporting that discussion on this subject reduced her anxiety and helped her think of more ways to give back to her daughter following the transition to Jodi Wolfe, such as cooking, cleaning, and caring for pets.  [name] reported that this transition could also give her the opportunity to get back in touch with her faith, attend church and pray more often.  Clinician will continue to monitor.       Plan: Follow up again in 2 weeks virtually.   Diagnosis: Bipolar Disorder, current episode depressed, severe w/out psychotic features; and PTSD.  Collaboration of Care:   No collaboration required at this time.                                                   Patient/Guardian was advised Release of Information must be obtained prior to any record release in order to collaborate their care with an outside provider. Patient/Guardian was advised if they have not already done so to contact the registration department to sign all necessary forms in order for us  to release information regarding their care.    Consent: Patient/Guardian gives verbal consent for treatment and assignment of benefits for services provided during this visit. Patient/Guardian expressed understanding and agreed to proceed.  [name], LCSW, LCAS 54/28/25

## 2024-09-27 ENCOUNTER — Ambulatory Visit (HOSPITAL_COMMUNITY): Admitting: Licensed Clinical Social Worker

## 2024-10-05 ENCOUNTER — Ambulatory Visit (INDEPENDENT_AMBULATORY_CARE_PROVIDER_SITE_OTHER): Admitting: Licensed Clinical Social Worker

## 2024-10-05 DIAGNOSIS — F4310 Post-traumatic stress disorder, unspecified: Secondary | ICD-10-CM | POA: Diagnosis not present

## 2024-10-05 DIAGNOSIS — F314 Bipolar disorder, current episode depressed, severe, without psychotic features: Secondary | ICD-10-CM | POA: Diagnosis not present
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
## 2024-10-31 ENCOUNTER — Encounter (HOSPITAL_COMMUNITY): Payer: Self-pay | Admitting: Psychiatry

## 2024-10-31 ENCOUNTER — Telehealth (HOSPITAL_BASED_OUTPATIENT_CLINIC_OR_DEPARTMENT_OTHER): Admitting: Psychiatry

## 2024-10-31 VITALS — Wt 198.0 lb

## 2024-10-31 DIAGNOSIS — F410 Panic disorder [episodic paroxysmal anxiety] without agoraphobia: Secondary | ICD-10-CM | POA: Diagnosis not present

## 2024-10-31 DIAGNOSIS — F3131 Bipolar disorder, current episode depressed, mild: Secondary | ICD-10-CM | POA: Diagnosis not present

## 2024-10-31 DIAGNOSIS — F4310 Post-traumatic stress disorder, unspecified: Secondary | ICD-10-CM | POA: Diagnosis not present

## 2024-10-31 MED ORDER — LAMOTRIGINE 150 MG PO TABS: 300.0000 mg | ORAL_TABLET | Freq: Every day | ORAL | 0 refills | Status: DC

## 2024-10-31 MED ORDER — CHLORPROMAZINE HCL 100 MG PO TABS: 100.0000 mg | ORAL_TABLET | Freq: Every day | ORAL | 0 refills | Status: DC

## 2024-10-31 MED ORDER — CLONAZEPAM 0.5 MG PO TABS: 0.5000 mg | ORAL_TABLET | Freq: Two times a day (BID) | ORAL | 2 refills | Status: DC
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
## 2024-11-15 ENCOUNTER — Ambulatory Visit (INDEPENDENT_AMBULATORY_CARE_PROVIDER_SITE_OTHER): Admitting: Licensed Clinical Social Worker

## 2024-11-15 DIAGNOSIS — F4310 Post-traumatic stress disorder, unspecified: Secondary | ICD-10-CM

## 2024-11-15 DIAGNOSIS — F314 Bipolar disorder, current episode depressed, severe, without psychotic features: Secondary | ICD-10-CM
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
## 2024-12-12 ENCOUNTER — Ambulatory Visit (INDEPENDENT_AMBULATORY_CARE_PROVIDER_SITE_OTHER): Admitting: Licensed Clinical Social Worker

## 2024-12-12 DIAGNOSIS — F314 Bipolar disorder, current episode depressed, severe, without psychotic features: Secondary | ICD-10-CM

## 2024-12-12 DIAGNOSIS — F4310 Post-traumatic stress disorder, unspecified: Secondary | ICD-10-CM

## 2024-12-12 NOTE — Progress Notes (Signed)
 Virtual Visit via Video Note   I connected with Jodi Wolfe on 12/12/24 at 1:00pm by video enabled telemedicine application and verified that I am speaking with the correct person using two identifiers.   I discussed the limitations, risks, security and privacy concerns of performing an evaluation and management service by video and the availability of in person appointments. I also discussed with the patient that there may be a patient responsible charge related to this service. The patient expressed understanding and agreed to proceed.   I discussed the assessment and treatment plan with the patient. The patient was provided an opportunity to ask questions and all were answered. The patient agreed with the plan and demonstrated an understanding of the instructions.   The patient was advised to call back or seek an in-person evaluation if the symptoms worsen or if the condition fails to improve as anticipated.   I provided 55 minutes of non-face-to-face time during this encounter.   [name], LCSW, LCAS ________________________________ THERAPIST PROGRESS NOTE   Session Time: 1:00pm - 1:55pm      Location: Patient: Patient Home Provider: Home Office   Participation Level: Active   Behavioral Response: Alert, casually dressed, anxious mood/affect   Type of Therapy:  Individual Therapy   Treatment Goals addressed: Depression, panic attack, and anxiety management; Medication compliance; Managing boundaries with support network    Progress Towards Goals: Progressing    Interventions: CBT, communication skills    Summary: Jodi Wolfe is a 53 year old Caucasian female that presented for therapy appointment today with diagnoses of Bipolar Disorder, current episode depressed, severe w/out psychotic features; and PTSD.         Suicidal/Homicidal: None; without plan or intent.    Therapist Response: Clinician met with [place] for virtual therapy session today and assessed for safety,  sobriety, and medication compliance.  Jodi Wolfe presented for appointment on time and was alert, oriented x5, with no evidence or self-report of active SI/HI or A/V H.  Jodi Wolfe reported ongoing compliance with medication and denied any use of alcohol or illicit substances.  Jodi Wolfe denied experiencing any symptoms of mania.  Clinician inquired about Jodi Wolfe's current emotional ratings, as well as any significant changes in thoughts, feelings, or behavior since last check-in. Jodi Wolfe reported scores of 3/10 for depression, 7/10 for anxiety, 2/10 for irritability, and noted that she has experienced roughly 4 panic attacks this past week.  Jodi Wolfe reported that a recent success was visiting her daughter and son in law for Thanksgiving and catching up on things.  She reported that a struggle has been worrying about her children, and how the upcoming move to live together might affect them, stating I don't want to overwhelm her.  Clinician reviewed material with Jodi Wolfe today on communication skills which could be utilized to increase understanding and support within the relationship.  Clinician presented a handout on 'soft startups' which offered suggestions on how Annastyn could address a problem assertively with her family, including tips such as choosing an appropriate time/setting, being mindful of maintaining gentle tone, volume and language, while avoiding triggering nonverbals such as rolling eyes, as well as utilizing I statements to express feelings, focusing on one problem at a time, and being respectful.  Intervention was effective, as evidenced by Jodi Wolfe's active engagement in discussion on subject, and receptiveness to suggestions offered in improving communication, stating I think talking to her would be a good idea.  Jodi Wolfe reported that she is anxious about upsetting her daughter, so she will wait  until the daughter calls her for her birthday on Thursday, since this will be a calm, positive day for both of them.  She  reported that she will express how she feels with I statements and try to use coping skills to avoid having a panic attack.  Clinician will continue to monitor.       Plan: Follow up again in 2 weeks virtually.   Diagnosis: Bipolar Disorder, current episode depressed, severe w/out psychotic features; and PTSD.  Collaboration of Care:   No collaboration required at this time.                                                   Patient/Guardian was advised Release of Information must be obtained prior to any record release in order to collaborate their care with an outside provider. Patient/Guardian was advised if they have not already done so to contact the registration department to sign all necessary forms in order for us  to release information regarding their care.    Consent: Patient/Guardian gives verbal consent for treatment and assignment of benefits for services provided during this visit. Patient/Guardian expressed understanding and agreed to proceed.  [name], LCSW, LCAS 12/12/24

## 2025-01-10 ENCOUNTER — Ambulatory Visit (INDEPENDENT_AMBULATORY_CARE_PROVIDER_SITE_OTHER): Admitting: Licensed Clinical Social Worker

## 2025-01-10 DIAGNOSIS — F4310 Post-traumatic stress disorder, unspecified: Secondary | ICD-10-CM

## 2025-01-10 DIAGNOSIS — F314 Bipolar disorder, current episode depressed, severe, without psychotic features: Secondary | ICD-10-CM | POA: Diagnosis not present

## 2025-01-10 NOTE — Progress Notes (Signed)
 Virtual Visit via Video Note   I connected with [name] on 01/10/25 at 1:00pm by video enabled telemedicine application and verified that I am speaking with the correct person using two identifiers.   I discussed the limitations, risks, security and privacy concerns of performing an evaluation and management service by video and the availability of in person appointments. I also discussed with the patient that there may be a patient responsible charge related to this service. The patient expressed understanding and agreed to proceed.   I discussed the assessment and treatment plan with the patient. The patient was provided an opportunity to ask questions and all were answered. The patient agreed with the plan and demonstrated an understanding of the instructions.   The patient was advised to call back or seek an in-person evaluation if the symptoms worsen or if the condition fails to improve as anticipated.   I provided 48 minutes of non-face-to-face time during this encounter.   [name], LCSW, LCAS ________________________________ THERAPIST PROGRESS NOTE   Session Time: 1:00pm - 1:48pm   Location: Patient: Patient Home Provider: Home Office   Participation Level: Active   Behavioral Response: Alert, casually dressed, anxious mood/affect   Type of Therapy:  Individual Therapy   Treatment Goals addressed: Depression, panic attack, and anxiety management; Medication compliance; Managing boundaries with support network   Progress Towards Goals: Progressing    Interventions: CBT, supportive therapy, anger management    Summary: Aysia Lowder is a 54 year old Caucasian female that presented for therapy appointment today with diagnoses of Bipolar Disorder, current episode depressed, severe w/out psychotic features; and PTSD.         Suicidal/Homicidal: None; without plan or intent.    Therapist Response: Clinician met with Mercedez for virtual therapy appointment today and assessed  for safety, sobriety, and medication compliance.  Jodene presented for session on time and was alert, oriented x5, with no evidence or self-report of active SI/HI or A/V H.  Jozi reported ongoing compliance with medication and denied any use of alcohol or illicit substances.  Gaynelle denied experiencing any symptoms of mania.  Clinician inquired about Jaylei's emotional ratings today, as well as any significant changes in thoughts, feelings, or behavior since previous check-in. Nicie reported scores of 3/10 for depression, 9/10 for anxiety, 2/10 for irritability, and noted that she has experienced roughly 4 panic attacks this last week.  Venessa reported that a recent struggle was learning that her father has been dealing with throat cancer, and will be scheduled for a biopsy soon.  Lavaun reported that she wasn't informed of this until late Monday night, and her daughter said the family was worried how she would react to the news.  Ivi reported that she felt very angry about them withholding information.  Clinician revisited an anger management handout with Willa during this appointment to assist.  This handout explained how anger tends to be an emotion that is easily identifiable due to outward behavior such as frequent emotional outbursts, but can conceal other difficult emotions under the surface if compartmentalization is used.  It was also noted that triggering people, places, and things can influence this emotion, as well as potential outbursts.  A list of common emotions linked to anger was provided, and [name] was tasked with identifying which ones could be having a present influence, in addition to related triggers.  Clinician also encouraged Mckennah to consider various coping skills or self-care activities which could offer an appropriate stress outlet.  Intervention was  effective, as evidenced by Vanda actively engaging in discussion on subject, reporting that her recent increase in anger could be explained by  suppressed emotions related to her father's failing health.  Susana reported that she tries to appear strong to people in her family such as her father, but this has led her to ignore feelings such as helplessness, frustration, pain, grief, anxiety, stress, or insecurity and grow in intensity. Altha reported that she will try to actively use coping skills to avoid outbursts as she deals with this distressing news, including praying, reading her bible, screaming into her pillow, and listening to calming music.  Siniyah reported that she will also try to distract herself when possible, stating I'm going into a situation that is not in my control.  Eryn was disconnected at 1:48pm.  Clinician attempted to outreach Tiaja by phone at 1:50pm when she didn't return, but clinician received busy signal.  Clinician will continue to monitor.       Plan: Follow up again in 2 weeks virtually.   Diagnosis: Bipolar Disorder, current episode depressed, severe w/out psychotic features; and PTSD.  Collaboration of Care:   No collaboration required at this time.                                                   Patient/Guardian was advised Release of Information must be obtained prior to any record release in order to collaborate their care with an outside provider. Patient/Guardian was advised if they have not already done so to contact the registration department to sign all necessary forms in order for us  to release information regarding their care.    Consent: Patient/Guardian gives verbal consent for treatment and assignment of benefits for services provided during this visit. Patient/Guardian expressed understanding and agreed to proceed.  [name], [place], LCAS 01/10/25

## 2025-01-19 ENCOUNTER — Telehealth (HOSPITAL_COMMUNITY): Admitting: Psychiatry

## 2025-01-19 ENCOUNTER — Encounter (HOSPITAL_COMMUNITY): Payer: Self-pay | Admitting: Psychiatry

## 2025-01-19 VITALS — Wt 198.0 lb

## 2025-01-19 DIAGNOSIS — F4310 Post-traumatic stress disorder, unspecified: Secondary | ICD-10-CM

## 2025-01-19 DIAGNOSIS — F410 Panic disorder [episodic paroxysmal anxiety] without agoraphobia: Secondary | ICD-10-CM

## 2025-01-19 DIAGNOSIS — F3131 Bipolar disorder, current episode depressed, mild: Secondary | ICD-10-CM

## 2025-01-19 MED ORDER — CHLORPROMAZINE HCL 100 MG PO TABS: 100.0000 mg | ORAL_TABLET | Freq: Every day | ORAL | 0 refills | Status: AC

## 2025-01-19 MED ORDER — LAMOTRIGINE 150 MG PO TABS: 300.0000 mg | ORAL_TABLET | Freq: Every day | ORAL | 0 refills | Status: AC

## 2025-01-19 MED ORDER — CLONAZEPAM 0.5 MG PO TABS: 0.5000 mg | ORAL_TABLET | Freq: Two times a day (BID) | ORAL | 0 refills | Status: AC
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
## 2025-01-31 ENCOUNTER — Telehealth (HOSPITAL_COMMUNITY): Admitting: Psychiatry

## 2025-02-01 ENCOUNTER — Other Ambulatory Visit (HOSPITAL_COMMUNITY): Payer: Self-pay | Admitting: Psychiatry

## 2025-02-01 DIAGNOSIS — F3131 Bipolar disorder, current episode depressed, mild: Secondary | ICD-10-CM

## 2025-02-01 DIAGNOSIS — F410 Panic disorder [episodic paroxysmal anxiety] without agoraphobia: Secondary | ICD-10-CM

## 2025-02-01 DIAGNOSIS — F4310 Post-traumatic stress disorder, unspecified: Secondary | ICD-10-CM

## 2025-04-20 ENCOUNTER — Telehealth (HOSPITAL_COMMUNITY): Admitting: Psychiatry

## 2025-05-30 ENCOUNTER — Ambulatory Visit: Admitting: Physician Assistant
